# Patient Record
Sex: Female | Born: 1975 | Race: White | Hispanic: No | Marital: Married | Smoking: Current every day smoker
Health system: Southern US, Community
[De-identification: ages and names within clinical notes are randomized; demographics above are authoritative.]

## PROBLEM LIST (undated history)

## (undated) DIAGNOSIS — F319 Bipolar disorder, unspecified: Secondary | ICD-10-CM

## (undated) DIAGNOSIS — D649 Anemia, unspecified: Secondary | ICD-10-CM

## (undated) DIAGNOSIS — T7840XA Allergy, unspecified, initial encounter: Secondary | ICD-10-CM

## (undated) DIAGNOSIS — R0902 Hypoxemia: Secondary | ICD-10-CM

## (undated) DIAGNOSIS — G4730 Sleep apnea, unspecified: Secondary | ICD-10-CM

## (undated) DIAGNOSIS — K219 Gastro-esophageal reflux disease without esophagitis: Secondary | ICD-10-CM

## (undated) DIAGNOSIS — R011 Cardiac murmur, unspecified: Secondary | ICD-10-CM

## (undated) DIAGNOSIS — J45909 Unspecified asthma, uncomplicated: Secondary | ICD-10-CM

## (undated) DIAGNOSIS — J449 Chronic obstructive pulmonary disease, unspecified: Secondary | ICD-10-CM

## (undated) DIAGNOSIS — S069X9A Unspecified intracranial injury with loss of consciousness of unspecified duration, initial encounter: Secondary | ICD-10-CM

## (undated) DIAGNOSIS — R0600 Dyspnea, unspecified: Secondary | ICD-10-CM

## (undated) DIAGNOSIS — R413 Other amnesia: Secondary | ICD-10-CM

## (undated) DIAGNOSIS — N189 Chronic kidney disease, unspecified: Secondary | ICD-10-CM

## (undated) DIAGNOSIS — N809 Endometriosis, unspecified: Secondary | ICD-10-CM

## (undated) DIAGNOSIS — G43909 Migraine, unspecified, not intractable, without status migrainosus: Secondary | ICD-10-CM

## (undated) DIAGNOSIS — F329 Major depressive disorder, single episode, unspecified: Secondary | ICD-10-CM

## (undated) DIAGNOSIS — F32A Depression, unspecified: Secondary | ICD-10-CM

## (undated) DIAGNOSIS — Z9289 Personal history of other medical treatment: Secondary | ICD-10-CM

## (undated) DIAGNOSIS — F419 Anxiety disorder, unspecified: Secondary | ICD-10-CM

## (undated) DIAGNOSIS — J189 Pneumonia, unspecified organism: Secondary | ICD-10-CM

## (undated) DIAGNOSIS — M1990 Unspecified osteoarthritis, unspecified site: Secondary | ICD-10-CM

## (undated) HISTORY — DX: Anxiety disorder, unspecified: F41.9

## (undated) HISTORY — DX: Unspecified intracranial injury with loss of consciousness of unspecified duration, initial encounter: S06.9X9A

## (undated) HISTORY — DX: Chronic obstructive pulmonary disease, unspecified: J44.9

## (undated) HISTORY — PX: HERNIA REPAIR: SHX51

## (undated) HISTORY — DX: Cardiac murmur, unspecified: R01.1

## (undated) HISTORY — DX: Depression, unspecified: F32.A

## (undated) HISTORY — DX: Hypoxemia: R09.02

## (undated) HISTORY — PX: HAND SURGERY: SHX662

## (undated) HISTORY — PX: TUBAL LIGATION: SHX77

## (undated) HISTORY — DX: Unspecified asthma, uncomplicated: J45.909

## (undated) HISTORY — DX: Major depressive disorder, single episode, unspecified: F32.9

## (undated) HISTORY — DX: Migraine, unspecified, not intractable, without status migrainosus: G43.909

## (undated) HISTORY — DX: Chronic kidney disease, unspecified: N18.9

## (undated) HISTORY — DX: Allergy, unspecified, initial encounter: T78.40XA

---

## 1997-04-08 ENCOUNTER — Ambulatory Visit (HOSPITAL_COMMUNITY): Admission: RE | Admit: 1997-04-08 | Discharge: 1997-04-08 | Payer: Self-pay | Admitting: Obstetrics and Gynecology

## 1997-07-21 ENCOUNTER — Other Ambulatory Visit: Admission: RE | Admit: 1997-07-21 | Discharge: 1997-07-21 | Payer: Self-pay | Admitting: General Surgery

## 1997-09-15 ENCOUNTER — Inpatient Hospital Stay (HOSPITAL_COMMUNITY): Admission: AD | Admit: 1997-09-15 | Discharge: 1997-09-18 | Payer: Self-pay | Admitting: Obstetrics and Gynecology

## 1997-10-20 ENCOUNTER — Other Ambulatory Visit: Admission: RE | Admit: 1997-10-20 | Discharge: 1997-10-20 | Payer: Self-pay | Admitting: Obstetrics and Gynecology

## 1998-10-23 ENCOUNTER — Other Ambulatory Visit: Admission: RE | Admit: 1998-10-23 | Discharge: 1998-10-23 | Payer: Self-pay | Admitting: Obstetrics and Gynecology

## 1999-02-12 DIAGNOSIS — S069XAA Unspecified intracranial injury with loss of consciousness status unknown, initial encounter: Secondary | ICD-10-CM

## 1999-02-12 DIAGNOSIS — S069X9A Unspecified intracranial injury with loss of consciousness of unspecified duration, initial encounter: Secondary | ICD-10-CM

## 1999-02-12 HISTORY — DX: Unspecified intracranial injury with loss of consciousness status unknown, initial encounter: S06.9XAA

## 1999-02-12 HISTORY — DX: Unspecified intracranial injury with loss of consciousness of unspecified duration, initial encounter: S06.9X9A

## 1999-04-16 ENCOUNTER — Emergency Department (HOSPITAL_COMMUNITY): Admission: EM | Admit: 1999-04-16 | Discharge: 1999-04-16 | Payer: Self-pay | Admitting: Emergency Medicine

## 1999-04-17 ENCOUNTER — Encounter: Payer: Self-pay | Admitting: Emergency Medicine

## 1999-08-06 ENCOUNTER — Inpatient Hospital Stay (HOSPITAL_COMMUNITY)
Admission: RE | Admit: 1999-08-06 | Discharge: 1999-08-31 | Payer: Self-pay | Admitting: Physical Medicine & Rehabilitation

## 1999-08-15 ENCOUNTER — Encounter: Payer: Self-pay | Admitting: Physical Medicine & Rehabilitation

## 1999-09-05 ENCOUNTER — Encounter
Admission: RE | Admit: 1999-09-05 | Discharge: 1999-12-04 | Payer: Self-pay | Admitting: Physical Medicine & Rehabilitation

## 2000-02-28 ENCOUNTER — Encounter
Admission: RE | Admit: 2000-02-28 | Discharge: 2000-05-28 | Payer: Self-pay | Admitting: Physical Medicine & Rehabilitation

## 2000-07-08 ENCOUNTER — Encounter
Admission: RE | Admit: 2000-07-08 | Discharge: 2000-10-06 | Payer: Self-pay | Admitting: Physical Medicine & Rehabilitation

## 2000-07-21 ENCOUNTER — Ambulatory Visit (HOSPITAL_COMMUNITY): Admission: RE | Admit: 2000-07-21 | Discharge: 2000-07-21 | Payer: Self-pay | Admitting: Internal Medicine

## 2000-11-22 ENCOUNTER — Emergency Department (HOSPITAL_COMMUNITY): Admission: EM | Admit: 2000-11-22 | Discharge: 2000-11-23 | Payer: Self-pay | Admitting: *Deleted

## 2002-02-17 ENCOUNTER — Ambulatory Visit (HOSPITAL_COMMUNITY): Admission: RE | Admit: 2002-02-17 | Discharge: 2002-02-17 | Payer: Self-pay | Admitting: Family Medicine

## 2002-02-17 ENCOUNTER — Encounter: Payer: Self-pay | Admitting: Family Medicine

## 2004-05-02 ENCOUNTER — Ambulatory Visit (HOSPITAL_COMMUNITY): Admission: RE | Admit: 2004-05-02 | Discharge: 2004-05-02 | Payer: Self-pay | Admitting: Internal Medicine

## 2005-01-14 ENCOUNTER — Ambulatory Visit (HOSPITAL_COMMUNITY): Admission: RE | Admit: 2005-01-14 | Discharge: 2005-01-14 | Payer: Self-pay | Admitting: Neurology

## 2005-03-20 ENCOUNTER — Emergency Department (HOSPITAL_COMMUNITY): Admission: EM | Admit: 2005-03-20 | Discharge: 2005-03-20 | Payer: Self-pay | Admitting: Emergency Medicine

## 2006-07-18 ENCOUNTER — Emergency Department (HOSPITAL_COMMUNITY): Admission: EM | Admit: 2006-07-18 | Discharge: 2006-07-18 | Payer: Self-pay | Admitting: Emergency Medicine

## 2007-10-17 ENCOUNTER — Emergency Department (HOSPITAL_COMMUNITY): Admission: EM | Admit: 2007-10-17 | Discharge: 2007-10-17 | Payer: Self-pay | Admitting: Emergency Medicine

## 2007-10-18 ENCOUNTER — Ambulatory Visit (HOSPITAL_COMMUNITY): Admission: RE | Admit: 2007-10-18 | Discharge: 2007-10-18 | Payer: Self-pay | Admitting: Emergency Medicine

## 2010-03-26 ENCOUNTER — Other Ambulatory Visit: Payer: Self-pay | Admitting: Obstetrics and Gynecology

## 2010-03-26 DIAGNOSIS — R102 Pelvic and perineal pain: Secondary | ICD-10-CM

## 2010-03-30 ENCOUNTER — Ambulatory Visit (HOSPITAL_COMMUNITY)
Admission: RE | Admit: 2010-03-30 | Discharge: 2010-03-30 | Disposition: A | Discharge status: Home / Self Care | Payer: Medicare Other | Source: Ambulatory Visit | Attending: Obstetrics and Gynecology | Admitting: Obstetrics and Gynecology

## 2010-03-30 DIAGNOSIS — N949 Unspecified condition associated with female genital organs and menstrual cycle: Secondary | ICD-10-CM | POA: Insufficient documentation

## 2010-03-30 DIAGNOSIS — R102 Pelvic and perineal pain: Secondary | ICD-10-CM

## 2010-05-11 ENCOUNTER — Other Ambulatory Visit: Payer: Self-pay | Admitting: Family Medicine

## 2010-05-14 ENCOUNTER — Ambulatory Visit (HOSPITAL_COMMUNITY)
Admission: RE | Admit: 2010-05-14 | Discharge: 2010-05-14 | Disposition: A | Discharge status: Home / Self Care | Payer: Medicare Other | Source: Ambulatory Visit | Attending: Family Medicine | Admitting: Family Medicine

## 2010-05-14 DIAGNOSIS — R109 Unspecified abdominal pain: Secondary | ICD-10-CM | POA: Insufficient documentation

## 2010-11-14 LAB — URINALYSIS, ROUTINE W REFLEX MICROSCOPIC
Bilirubin Urine: NEGATIVE
Ketones, ur: NEGATIVE
Protein, ur: NEGATIVE
pH: 6.5

## 2010-11-14 LAB — PREGNANCY, URINE: Preg Test, Ur: NEGATIVE

## 2010-11-14 LAB — DIFFERENTIAL
Basophils Absolute: 0.1
Basophils Relative: 1
Eosinophils Relative: 5
Lymphs Abs: 2.2
Monocytes Relative: 6
Neutro Abs: 5.8
Neutrophils Relative %: 64

## 2010-11-14 LAB — BASIC METABOLIC PANEL
Calcium: 9.2
GFR calc Af Amer: >60
GFR calc non Af Amer: >60
Potassium: 3.6
Sodium: 139

## 2010-11-14 LAB — CBC
HCT: 39.3
Hemoglobin: 13.1
MCHC: 33.5
MCV: 80.8
Platelets: 326
RDW: 15.2

## 2010-11-14 LAB — WET PREP, GENITAL: Trich, Wet Prep: NONE SEEN

## 2011-04-02 DIAGNOSIS — R413 Other amnesia: Secondary | ICD-10-CM | POA: Diagnosis not present

## 2011-04-02 DIAGNOSIS — I10 Essential (primary) hypertension: Secondary | ICD-10-CM | POA: Diagnosis not present

## 2011-04-02 DIAGNOSIS — R51 Headache: Secondary | ICD-10-CM | POA: Diagnosis not present

## 2011-04-23 DIAGNOSIS — E785 Hyperlipidemia, unspecified: Secondary | ICD-10-CM | POA: Diagnosis not present

## 2011-04-23 DIAGNOSIS — N946 Dysmenorrhea, unspecified: Secondary | ICD-10-CM | POA: Diagnosis not present

## 2011-04-26 DIAGNOSIS — N949 Unspecified condition associated with female genital organs and menstrual cycle: Secondary | ICD-10-CM | POA: Diagnosis not present

## 2011-05-20 DIAGNOSIS — D259 Leiomyoma of uterus, unspecified: Secondary | ICD-10-CM | POA: Diagnosis not present

## 2011-05-20 DIAGNOSIS — N949 Unspecified condition associated with female genital organs and menstrual cycle: Secondary | ICD-10-CM | POA: Diagnosis not present

## 2011-05-27 DIAGNOSIS — N920 Excessive and frequent menstruation with regular cycle: Secondary | ICD-10-CM | POA: Diagnosis not present

## 2011-05-27 DIAGNOSIS — N949 Unspecified condition associated with female genital organs and menstrual cycle: Secondary | ICD-10-CM | POA: Diagnosis not present

## 2011-05-29 DIAGNOSIS — R413 Other amnesia: Secondary | ICD-10-CM | POA: Diagnosis not present

## 2011-07-26 ENCOUNTER — Ambulatory Visit (INDEPENDENT_AMBULATORY_CARE_PROVIDER_SITE_OTHER): Payer: Medicare Other | Admitting: Urgent Care

## 2011-07-26 ENCOUNTER — Encounter: Payer: Self-pay | Admitting: Urgent Care

## 2011-07-26 VITALS — BP 122/74 | HR 75 | Temp 97.4°F | Ht 69.0 in | Wt 194.0 lb

## 2011-07-26 DIAGNOSIS — R350 Frequency of micturition: Secondary | ICD-10-CM | POA: Insufficient documentation

## 2011-07-26 DIAGNOSIS — R109 Unspecified abdominal pain: Secondary | ICD-10-CM | POA: Diagnosis not present

## 2011-07-26 DIAGNOSIS — K5900 Constipation, unspecified: Secondary | ICD-10-CM | POA: Diagnosis not present

## 2011-07-26 LAB — CBC WITH DIFFERENTIAL/PLATELET
Basophils Absolute: 0.1 10*3/uL (ref 0.0–0.1)
Basophils Relative: 1 % (ref 0–1)
Eosinophils Relative: 6 % — ABNORMAL HIGH (ref 0–5)
HCT: 43.6 % (ref 36.0–46.0)
Hemoglobin: 15.1 g/dL — ABNORMAL HIGH (ref 12.0–15.0)
Lymphs Abs: 1.9 10*3/uL (ref 0.7–4.0)
MCH: 28.8 pg (ref 26.0–34.0)
Neutrophils Relative %: 63 % (ref 43–77)
Platelets: 365 10*3/uL (ref 150–400)
RBC: 5.24 MIL/uL — ABNORMAL HIGH (ref 3.87–5.11)
RDW: 13.5 % (ref 11.5–15.5)

## 2011-07-26 LAB — COMPREHENSIVE METABOLIC PANEL
AST: 14 U/L (ref 0–37)
Alkaline Phosphatase: 70 U/L (ref 39–117)
CO2: 27 mEq/L (ref 19–32)
Calcium: 9.5 mg/dL (ref 8.4–10.5)
Creat: 0.81 mg/dL (ref 0.50–1.10)
Potassium: 4.5 mEq/L (ref 3.5–5.3)
Sodium: 141 mEq/L (ref 135–145)
Total Bilirubin: 0.4 mg/dL (ref 0.3–1.2)
Total Protein: 7 g/dL (ref 6.0–8.3)

## 2011-07-26 MED ORDER — LINACLOTIDE 145 MCG PO CAPS: 1.0000 | ORAL_CAPSULE | Freq: Every day | ORAL | Status: DC

## 2011-07-26 NOTE — Assessment & Plan Note (Signed)
R/o UTI
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
## 2011-07-26 NOTE — Assessment & Plan Note (Addendum)
Shelby [name] is a pleasant 36 y.o. female with 10-yr history of chronic abdominal pain, mostly bilateral lower quadrants.  She also has increased urinary frequency, nausea & constipation at this time.  Previous GYN work-up benign per pt.  Differentials include IBS w/ constipation, functional abdominal pain, celiac disease, diverticulitis, or UTI.     CBC, CMP, lipase, UA, U preg IFOBT, if positive consider colonoscopy 1-800-QUIT-NOW for help quitting smoking To ER if you have severe pain

## 2011-07-26 NOTE — Patient Instructions (Addendum)
1-800-QUIT-NOW for help quitting smoking High fiber diet Return stool specimen to our office ASAP Begin Linzess daily for constipation Go get your labs today-we will call w/ results To ER if you have severe pain High Fiber Diet A high fiber diet changes your normal diet to include more whole grains, legumes, fruits, and vegetables. Changes in the diet involve replacing refined carbohydrates with unrefined foods. The calorie level of the diet is essentially unchanged. The Dietary Reference Intake (recommended amount) for adult males is 38 g per day. For adult females, it is 25 g per day. Pregnant and lactating women should consume 28 g of fiber per day. Fiber is the intact part of a plant that is not broken down during digestion. Functional fiber is fiber that has been isolated from the plant to provide a beneficial effect in the body. PURPOSE  Increase stool bulk.   Ease and regulate bowel movements.   Lower cholesterol.  INDICATIONS THAT YOU NEED MORE FIBER  Constipation and hemorrhoids.   Uncomplicated diverticulosis (intestine condition) and irritable bowel syndrome.   Weight management.   As a protective measure against hardening of the arteries (atherosclerosis), diabetes, and cancer.  NOTE OF CAUTION If you have a digestive or bowel problem, ask your caregiver for advice before adding high fiber foods to your diet. Some of the following medical problems are such that a high fiber diet should not be used without consulting your caregiver:  Acute diverticulitis (intestine infection).   Partial small bowel obstructions.   Complicated diverticular disease involving bleeding, rupture (perforation), or abscess (boil, furuncle).   Presence of autonomic neuropathy (nerve damage) or gastric paresis (stomach cannot empty itself).  GUIDELINES FOR INCREASING FIBER  Start adding fiber to the diet slowly. A gradual increase of about 5 more grams (2 slices of whole-wheat bread, 2 servings  of most fruits or vegetables, or 1 bowl of high fiber cereal) per day is best. Too rapid an increase in fiber may result in constipation, flatulence, and bloating.   Drink enough water and fluids to keep your urine clear or pale yellow. Water, juice, or caffeine-free drinks are recommended. Not drinking enough fluid may cause constipation.   Eat a variety of high fiber foods rather than one type of fiber.   Try to increase your intake of fiber through using high fiber foods rather than fiber pills or supplements that contain small amounts of fiber.   The goal is to change the types of food eaten. Do not supplement your present diet with high fiber foods, but replace foods in your present diet.  INCLUDE A VARIETY OF FIBER SOURCES  Replace refined and processed grains with whole grains, canned fruits with fresh fruits, and incorporate other fiber sources. White rice, white breads, and most bakery goods contain little or no fiber.   Brown whole-grain rice, buckwheat oats, and many fruits and vegetables are all good sources of fiber. These include: broccoli, Brussels sprouts, cabbage, cauliflower, beets, sweet potatoes, white potatoes (skin on), carrots, tomatoes, eggplant, squash, berries, fresh fruits, and dried fruits.   Cereals appear to be the richest source of fiber. Cereal fiber is found in whole grains and bran. Bran is the fiber-rich outer coat of cereal grain, which is largely removed in refining. In whole-grain cereals, the bran remains. In breakfast cereals, the largest amount of fiber is found in those with "bran" in their names. The fiber content is sometimes indicated on the label.   You may need to include additional fruits  and vegetables each day.   In baking, for 1 cup white flour, you may use the following substitutions:   1 cup whole-wheat flour minus 2 tbs.    cup white flour plus  cup whole-wheat flour.  Document Released: 01/28/2005 Document Revised: 01/17/2011 Document  Reviewed: 12/06/2008 [facility] Patient Information 2012 [place], [place].

## 2011-07-26 NOTE — Assessment & Plan Note (Signed)
Chronic constipation.  Failed miralax. High fiber diet Begin Linzess daily for constipation TSH, TTG IgA, IgA

## 2011-07-26 NOTE — Progress Notes (Signed)
FAXED TO PCP 

## 2011-07-27 LAB — URINALYSIS W MICROSCOPIC + REFLEX CULTURE
Crystals: NONE SEEN
Ketones, ur: NEGATIVE mg/dL
Protein, ur: NEGATIVE mg/dL
Specific Gravity, Urine: 1.016 (ref 1.005–1.030)
pH: 6 (ref 5.0–8.0)

## 2011-07-29 ENCOUNTER — Ambulatory Visit (INDEPENDENT_AMBULATORY_CARE_PROVIDER_SITE_OTHER): Payer: Medicare Other | Admitting: Urgent Care

## 2011-07-29 DIAGNOSIS — R109 Unspecified abdominal pain: Secondary | ICD-10-CM | POA: Diagnosis not present

## 2011-07-29 LAB — IFOBT (OCCULT BLOOD): IFOBT: NEGATIVE

## 2011-07-29 NOTE — Progress Notes (Signed)
Quick Note:  Pt needs abx for UTI. Do we have sensitivity results somewhere? Thanks ______

## 2011-07-30 ENCOUNTER — Telehealth: Payer: Self-pay | Admitting: Urgent Care

## 2011-07-30 LAB — URINE CULTURE

## 2011-07-30 MED ORDER — VANCOMYCIN HCL 125 MG PO CAPS: 125.0000 mg | ORAL_CAPSULE | Freq: Four times a day (QID) | ORAL | Status: AC

## 2011-07-30 NOTE — Progress Notes (Signed)
Quick Note:  See phone note ______ 

## 2011-07-30 NOTE — Progress Notes (Signed)
Quick Note:    Noted. See phone note  ______

## 2011-07-30 NOTE — Telephone Encounter (Signed)
Pt is aware of her OV with RMR on July 23

## 2011-07-30 NOTE — Telephone Encounter (Signed)
Pt aware, please schedule ov with RMR only (per pt request) in 6 weeks.

## 2011-07-30 NOTE — Telephone Encounter (Signed)
Please call pt Urine cx positive for urinary tract infection. Pt allergic to PCN, will need Vanc per sensitivity.   Rx sent to pharm. Otherwise, blood work was normal. No evidence of celiac disease. FU OV  6 weeks to reassess Thanks

## 2011-08-06 ENCOUNTER — Telehealth: Payer: Self-pay | Admitting: Urgent Care

## 2011-08-06 MED ORDER — LUBIPROSTONE 24 MCG PO CAPS: 24.0000 ug | ORAL_CAPSULE | Freq: Two times a day (BID) | ORAL | Status: AC

## 2011-08-06 NOTE — Telephone Encounter (Signed)
Pts mother aware, samples at front desk with instructions on how to take.

## 2011-08-06 NOTE — Telephone Encounter (Signed)
Please let patient know her insurance would like a trial of Amitiza prior to coverage for Linzess. If she wants to do this, we can give her 2 boxes of samples of Amitiza 24 mcg daily or BID for constipation.  Be sure to take with food. Rx was sent to [facility] in [place]

## 2011-09-03 ENCOUNTER — Ambulatory Visit (INDEPENDENT_AMBULATORY_CARE_PROVIDER_SITE_OTHER): Payer: Medicare Other | Admitting: Internal Medicine

## 2011-09-03 ENCOUNTER — Encounter: Payer: Self-pay | Admitting: Internal Medicine

## 2011-09-03 VITALS — BP 119/76 | HR 79 | Temp 98.1°F | Ht 69.0 in | Wt 198.0 lb

## 2011-09-03 DIAGNOSIS — R109 Unspecified abdominal pain: Secondary | ICD-10-CM | POA: Diagnosis not present

## 2011-09-03 NOTE — Progress Notes (Signed)
Primary Care Physician:  Redmond Baseman, MD Primary Gastroenterologist:  Dr. Jena Gauss  Pre-Procedure History & Physical: HPI:  Shelby [name] is a 36 y.o. female here for followup of chronic infraumbilical abdominal/pelvic pain. Celiac screen negative. Urine pregnancy negative. Stool Hemoccult negative. Celiac screen negative. Constipation much better on the Linzess - no improvement in abdominal pain. Enterococcus UTI treated with vancomycin-no improvement in abdominal pain.  Pain worse with movement. Pain worse riding over bumps in the car, climbing steps. No improvement with endometrial ablation. Last CT 2012-negative-noncontrast.  Past Medical History  Diagnosis Date  . Asthma   . Depression   . Anxiety   . Traumatic brain injury ?    Dr Gerilyn Pilgrim  . Migraines     Past Surgical History  Procedure Date  . Cesarean section     x2  . Cyst remove     x3 from wrist-ganglion  . Uterine ablation     Prior to Admission medications   Medication Sig Start Date End Date Taking? Authorizing Provider  ibuprofen (ADVIL,MOTRIN) 200 MG tablet Take 800 mg by mouth every 8 (eight) hours as needed. For migraines   Yes Historical Provider, MD  Linaclotide (LINZESS) 145 MCG CAPS Take 1 capsule by mouth daily. 07/26/11  Yes Joselyn Arrow, NP  lubiprostone (AMITIZA) 24 MCG capsule Take 1 capsule (24 mcg total) by mouth 2 (two) times daily with a meal. 08/06/11 09/05/11 Yes Joselyn Arrow, NP    Allergies as of 09/03/2011 - Review Complete 09/03/2011  Allergen Reaction Noted  . Penicillins Other (See Comments) 07/26/2011    Family History  Problem Relation Age of Onset  . Diabetes Father   . Colon cancer Paternal Grandfather     History   Social History  . Marital Status: Divorced    Spouse Name: N/A    Number of Children: 2  . Years of Education: N/A   Occupational History  . disabled    Social History Main Topics  . Smoking status: Current Everyday Smoker -- 1.5 packs/day for  10 years    Types: Cigarettes  . Smokeless tobacco: Not on file  . Alcohol Use: No  . Drug Use: No  . Sexually Active: Not Currently   Other Topics Concern  . Not on file   Social History Narrative   Lives w/ parents & 2 children part-time (13/16)    Review of Systems: See HPI, otherwise negative ROS  Physical Exam: BP 119/76  Pulse 79  Temp 98.1 F (36.7 C) (Temporal)  Ht 5\' 9"  (1.753 m)  Wt 198 lb (89.812 kg)  BMI 29.24 kg/m2 General:   Alert,  Well-developed, well-nourished, pleasant and cooperative in NAD Skin:  Intact without significant lesions or rashes. Eyes:  Sclera clear, no icterus.   Conjunctiva pink. Ears:  Normal auditory acuity. Nose:  No deformity, discharge,  or lesions. Mouth:  No deformity or lesions. Neck:  Supple; no masses or thyromegaly. No significant cervical adenopathy. Lungs:  Clear throughout to auscultation.   No wheezes, crackles, or rhonchi. No acute distress. Heart:  Regular rate and rhythm; no murmurs, clicks, rubs,  or gallops. Abdomen: Non-distended, normal bowel sounds. No tenderness both lower quadrants. No obvious mass or organomegaly. Pulses:  Normal pulses noted. Extremities:  Without clubbing or edema.  Impression/Plan:  Chronic bilateral lower quadrant abdominal pain/pelvic pain not improved with the treatment of UTI, endometrial ablation or successful management of constipation. We're likely dealing with musculoskeletal pain.   An occult hernia or  persisting occult GYN pathology-less likely.   Adhesive disease certainly a possibility.  Recommendations: Contrast CT of abdomen and pelvis in the near future. If negative, consider diagnostic laparoscopy. If that procedure were not to be helpful, then she will need referral to pain management specialist.

## 2011-09-03 NOTE — Patient Instructions (Signed)
Contrast CT of abdomen and pelvis - to evaluate chronic pelvic pain  Further recommendations to follow

## 2011-09-04 ENCOUNTER — Ambulatory Visit (HOSPITAL_COMMUNITY)
Admission: RE | Admit: 2011-09-04 | Discharge: 2011-09-04 | Disposition: A | Discharge status: Home / Self Care | Payer: Medicare Other | Source: Ambulatory Visit | Attending: Internal Medicine | Admitting: Internal Medicine

## 2011-09-04 DIAGNOSIS — R109 Unspecified abdominal pain: Secondary | ICD-10-CM | POA: Diagnosis not present

## 2011-09-04 DIAGNOSIS — R1032 Left lower quadrant pain: Secondary | ICD-10-CM | POA: Diagnosis not present

## 2011-09-04 DIAGNOSIS — R1031 Right lower quadrant pain: Secondary | ICD-10-CM | POA: Diagnosis not present

## 2011-09-04 MED ORDER — IOHEXOL 300 MG/ML  SOLN
100.0000 mL | Freq: Once | INTRAMUSCULAR | Status: AC | PRN
  Administered 2011-09-04: 100 mL via INTRAVENOUS

## 2011-09-09 NOTE — Progress Notes (Signed)
Referral has been sent to Dr. Lovell Sheehan and Dr. Cheryll Cockayne

## 2011-09-10 ENCOUNTER — Telehealth: Payer: Self-pay | Admitting: Internal Medicine

## 2011-09-10 NOTE — Telephone Encounter (Signed)
Patient is scheduled with Dr. Lovell Sheehan office on Tuesday Aug 6th at 2:45 and patient is aware

## 2011-09-14 DIAGNOSIS — H109 Unspecified conjunctivitis: Secondary | ICD-10-CM | POA: Diagnosis not present

## 2011-09-14 DIAGNOSIS — F17200 Nicotine dependence, unspecified, uncomplicated: Secondary | ICD-10-CM | POA: Diagnosis not present

## 2011-09-14 DIAGNOSIS — H1030 Unspecified acute conjunctivitis, unspecified eye: Secondary | ICD-10-CM | POA: Diagnosis not present

## 2011-09-17 DIAGNOSIS — K429 Umbilical hernia without obstruction or gangrene: Secondary | ICD-10-CM | POA: Diagnosis not present

## 2011-09-17 NOTE — H&P (Signed)
  NTS SOAP Note  Vital Signs:  Vitals as of: 09/17/2011: Systolic 139: Diastolic 91: Heart Rate 90: Temp 98.9F: Height 5ft 9in: Weight 198Lbs 0 Ounces: Pain Level 5: BMI 29  BMI : 29.24 kg/m2  Subjective: This 36 Years 7 Months old Female presents for of    ABDOMINAL PAIN : ,Has been having lower abdominal pain, extending from below umbilicus to the pelvis bilaterally.  Not associated with food, specific movement, menstrual cycle.  No nausea, vomiting.  No masses noted.  Extensive workup including multiple gyn visits and GI consultation has been unrermarkable.  Review of Symptoms:  Constitutional:unremarkable      headache Eyes:  blurred vision bilateral Nose/Mouth/Throat:unremarkable Cardiovascular:  unremarkable   Respiratory:  dyspnea,wheezing     as above Genitourinary:unremarkable     Musculoskeletal:unremarkable   Skin:unremarkable Hematolgic/Lymphatic:unremarkable     Allergic/Immunologic:unremarkable     Past Medical History:    Reviewed   Past Medical History  Surgical History: 2 csections Psychiatric History:  Anxiety, Depression Allergies: pcn Medications: polymysin eye drops, namenda, amitiza, dorepezil   Social History:Reviewed  Social History  Preferred Language: English ([place]) Race:  White Ethnicity: Not Hispanic / Latino Age: 36 Years 7 Months Marital Status:  S Alcohol:  No Recreational drug(s):  No   Smoking Status: Current every day smoker reviewed on 09/17/2011 Started Date: 02/12/1995 Packs per day: 2.00   Family History:  Reviewed   Family History  Is there a family history of:cancer, DM, alzheimers, CAD    Objective Information: General:  Well appearing, well nourished in no distress. Head:Atraumatic; no masses; no abnormalities Heart:  RRR, no murmur Lungs:    CTA bilaterally, no wheezes, rhonchi, rales.  Breathing unlabored. Abdomen:Soft, NT/ND, normal  bowel sounds, no HSM, no masses.  No peritoneal signs.  Small umbilical hernia with protruding contents with straining. Back:unremarkable    Assessment:Umbilical hernia  Diagnosis &amp; Procedure: DiagnosisCode: 553.1, ProcedureCode: 99203,    Plan:Scheduled for umbilical herniorrhaphy on 09/20/11.  Patient and mother (who is a nurse) fully realize that this may or may not relieve the patient of pain.  I do not feel that laparoscopy and/or incidental appendectomy are indicated at this time.  They understand and agree.   Patient Education:Alternative treatments to surgery were discussed with patient (and family).  Risks and benefits  of procedure were fully explained to the patient (and family) who gave informed consent. Patient/family questions were addressed.  Follow-up:Pending Surgery  

## 2011-09-18 ENCOUNTER — Encounter (HOSPITAL_COMMUNITY): Payer: Self-pay | Admitting: Pharmacy Technician

## 2011-09-19 ENCOUNTER — Encounter (HOSPITAL_COMMUNITY): Payer: Self-pay

## 2011-09-19 ENCOUNTER — Encounter (HOSPITAL_COMMUNITY): Admission: RE | Admit: 2011-09-19 | Payer: Medicare Other | Source: Ambulatory Visit

## 2011-09-19 ENCOUNTER — Encounter (HOSPITAL_COMMUNITY): Payer: Self-pay | Admitting: Pharmacy Technician

## 2011-09-19 ENCOUNTER — Encounter (HOSPITAL_COMMUNITY)
Admission: RE | Admit: 2011-09-19 | Discharge: 2011-09-19 | Disposition: A | Discharge status: Home / Self Care | Payer: Medicare Other | Source: Ambulatory Visit | Attending: General Surgery | Admitting: General Surgery

## 2011-09-19 DIAGNOSIS — Z01812 Encounter for preprocedural laboratory examination: Secondary | ICD-10-CM | POA: Diagnosis not present

## 2011-09-19 DIAGNOSIS — K429 Umbilical hernia without obstruction or gangrene: Secondary | ICD-10-CM | POA: Diagnosis not present

## 2011-09-19 LAB — SURGICAL PCR SCREEN: Staphylococcus aureus: NEGATIVE

## 2011-09-19 LAB — HCG, SERUM, QUALITATIVE: Preg, Serum: NEGATIVE

## 2011-09-19 LAB — HEMOGLOBIN AND HEMATOCRIT, BLOOD: HCT: 42.8 % (ref 36.0–46.0)

## 2011-09-19 MED ORDER — CHLORHEXIDINE GLUCONATE 4 % EX LIQD: 1.0000 "application " | Freq: Once | CUTANEOUS | Status: DC

## 2011-09-19 NOTE — Patient Instructions (Addendum)
20 Shelby [name]  09/19/2011   Your procedure is scheduled on:  09/20/11  Report to [facility] at 09:50 AM.  Call this number if you have problems the morning of surgery: [phone]   Remember:   Do not eat or drink:After Midnight.  Take these medicines the morning of surgery with A SIP OF WATER: Namenda, Aricept, and use your Albuterol inhaler.   Do not wear jewelry, make-up or nail polish.  Do not wear lotions, powders, or perfumes. You may wear deodorant.  Do not shave 48 hours prior to surgery. Men may shave face and neck.  Do not bring valuables to the hospital.  Contacts, dentures or bridgework may not be worn into surgery.  Leave suitcase in the car. After surgery it may be brought to your room.  For patients admitted to the hospital, checkout time is 11:00 AM the day of discharge.   Patients discharged the day of surgery will not be allowed to drive home  Special Instructions: CHG Shower Use Special Wash: 1/2 bottle night before surgery and 1/2 bottle morning of surgery.   Please read over the following fact sheets that you were given: Pain Booklet, MRSA Information, Surgical Site Infection Prevention, Anesthesia Post-op Instructions and Care and Recovery After Surgery    Umbilical Herniorrhaphy A herniorrhaphy is surgery to repair a hernia. A hernia is a gap or weakness in the muscles of your abdomen. Umbilical means that your hernia is in the area around your belly button. You might be able to feel a small bulge in your abdomen where the hernia is. You might also have pain or discomfort. If the hernia is not repaired, the gap could get bigger. Your intestines could get trapped in the gap. This can be painful. It also can lead to other health problems, such as blocked intestines. LET YOUR CAREGIVER KNOW ABOUT:  Any allergies.   All medications you are taking, including:   Herbs, eyedrops, over-the-counter medications and cream   Blood thinners (anticoagulants), aspirin or  other drugs that could affect blood clotting.   Use of steroids (by mouth or as creams).   Previous problems with anesthetics, including local anesthetics.   Possibility of pregnancy, if this applies.   Any history of blood clots.   Any history of bleeding or other blood problems.   Previous surgery.   Smoking history.   Other health problems.  RISKS AND COMPLICATIONS  Short-term possibilities include:   Pain.   Excessive bleeding.   Hematoma. This is a pooling of blood under the wound.   Infection at the surgery site or of the mesh.   Numbness at the surgery site.   Swelling and bruising.   Slow healing.   Blood clots.   Intestine or bowel damage. This is rare.   Longer-term possibilities include:   Scarring.   Skin damage.   The need for additional surgery.   Another hernia.  BEFORE THE PROCEDURE  Stop using aspirin and non-steroidal anti-inflammatory drugs (NSAIDs) for pain relief. Also stop taking vitamin E. If possible, do this two weeks in advance.   If you take blood-thinners, ask your healthcare provider when you should stop taking them.   Do not eat or drink for about 8 hours before your surgery.   You might be asked to shower or wash with a special antibacterial soap before the procedure.   Wear clothes that will be easy to put on after the surgery.   Arrive at least an hour before  the surgery, or whenever your surgeon recommends. This will give you time to check in and fill out any needed paperwork.   This surgery is usually an outpatient procedure, so you will be able to go home the same day. Less often people stay overnight in the hospital after the procedure. Ask your healthcare provider what to expect. Either way, make arrangements in advance for someone to drive you home. After an outpatient procedure, you should have someone stay with you overnight.  PROCEDURE Your procedure can be done with traditional surgery. The surgeon opens the  abdomen and repairs the hernia. Or, sometimes it can be done with laparoscopic surgery. Then the procedure is done through multiple small incisions, using a camera to guide the repair. Talk with your surgeon about how the hernia will be repaired.  The preparation:   You will change into a hospital gown.   You will be given an IV. A needle will be inserted in your arm. Medication can flow directly into your body through this needle.   You might be given a sedative to help you relax.   You may be given a drug that will put you to sleep during the surgery (general anesthetic). Or, your abdomen will be numbed, and you will be drowsy but awake (local anesthetic).   For a traditional surgery (sometimes called open surgery):   Once you are pain-free, the surgeon will make a small cut (incision) in your abdomen.   The gap in the muscle wall will be repaired. The surgeon could sew muscle together over the gap. Or, a mesh or soft screen material can be used to strengthen the area. The mesh acts as a scaffolding and the body grows new strong tissue into and around it. This new tissue is what closes the gap of the hernia and prevents it from coming back.   A drain might be put in. Fluid sometimes collects under the wound as it heals. The drain helps get the fluid out of the area. A drain will probably be used if your hernia is large.   The surgeon will close the incision with small stitches.   For a laparoscopic surgery:   One you are pain-free, the surgeon will make a small incision in your abdomen.   A thin tube with a tiny camera attached to it will be inserted into the abdomen through the incision. What the camera "sees" is projected on a screen in the room. This gives the surgeon a good view of the hernia.   Other small incisions will be made so the surgeon can insert small tools that are used to repair the hernia.   The incisions will be closed with small stitches.  AFTER THE  PROCEDURE  You will be stay in a recovery area until the anesthesia has worn off. Your blood pressure and pulse will be checked every so often.   You might be asked to get up and try walking.   Sometimes people can go home the same day. For others, an overnight stay is needed.  HOME CARE INSTRUCTIONS   Take any medication that your surgeon prescribed. Follow the directions carefully. Take all of the medication.   Ask your surgeon whether you can take over-the-counter medicines for pain, discomfort or fever. Do not take aspirin unless your healthcare team says that you should. Aspirin increases the chances of bleeding.   Do not get the incision area wet for the first few days after surgery (or until your surgeon  says it is OK).   Avoid lifting heavy objects (more than 10 lbs, 4.5 kilograms) for 6 to 8 weeks after your surgery.   Expect some pain when climbing stairs for several days after surgery.   Avoid sexual activity for a few weeks. It can be uncomfortable or painful.   You should be able to drive within a few days. However, do not drive until you are no longer taking pain medicine. It can make you drowsy. It also can slow your reaction time.   You should be able to resume normal activity in a few days. When you can return to work will depend on the type of work you do. You can go back to a desk job much sooner than you can return to work that requires physical labor. Talk about this with your healthcare provider.  SEEK MEDICAL CARE IF:   You notice blood or fluid leaking from the wound.   The area around the incision becomes red or swollen.   You are having problems urinating.   You become nauseous or throw up for more than two days after the surgery.   You develop a fever of more than 100.5 F (38.1 C).  SEEK IMMEDIATE MEDICAL CARE IF:  You develop a fever of more than 102.0 F (38.9 C). Document Released: 04/26/2008 Document Revised: 01/17/2011 Document Reviewed:  04/26/2008 [facility] Patient Information 2012 [place], [place].   PATIENT INSTRUCTIONS POST-ANESTHESIA  IMMEDIATELY FOLLOWING SURGERY:  Do not drive or operate machinery for the first twenty four hours after surgery.  Do not make any important decisions for twenty four hours after surgery or while taking narcotic pain medications or sedatives.  If you develop intractable nausea and vomiting or a severe headache please notify your doctor immediately.  FOLLOW-UP:  Please make an appointment with your surgeon as instructed. You do not need to follow up with anesthesia unless specifically instructed to do so.  WOUND CARE INSTRUCTIONS (if applicable):  Keep a dry clean dressing on the anesthesia/puncture wound site if there is drainage.  Once the wound has quit draining you may leave it open to air.  Generally you should leave the bandage intact for twenty four hours unless there is drainage.  If the epidural site drains for more than 36-48 hours please call the anesthesia department.  QUESTIONS?:  Please feel free to call your physician or the hospital operator if you have any questions, and they will be happy to assist you.

## 2011-09-20 ENCOUNTER — Encounter (HOSPITAL_COMMUNITY): Admission: RE | Payer: Self-pay | Source: Ambulatory Visit

## 2011-09-20 ENCOUNTER — Ambulatory Visit (HOSPITAL_COMMUNITY): Admission: RE | Admit: 2011-09-20 | Payer: Medicare Other | Source: Ambulatory Visit | Admitting: General Surgery

## 2011-09-20 ENCOUNTER — Ambulatory Visit (HOSPITAL_COMMUNITY): Payer: Medicare Other | Admitting: Anesthesiology

## 2011-09-20 ENCOUNTER — Encounter (HOSPITAL_COMMUNITY)
Admission: RE | Disposition: A | Discharge status: Home / Self Care | Payer: Self-pay | Source: Ambulatory Visit | Attending: General Surgery

## 2011-09-20 ENCOUNTER — Encounter (HOSPITAL_COMMUNITY): Payer: Self-pay | Admitting: *Deleted

## 2011-09-20 ENCOUNTER — Encounter (HOSPITAL_COMMUNITY): Payer: Self-pay | Admitting: Anesthesiology

## 2011-09-20 ENCOUNTER — Ambulatory Visit (HOSPITAL_COMMUNITY)
Admission: RE | Admit: 2011-09-20 | Discharge: 2011-09-20 | Disposition: A | Discharge status: Home / Self Care | Payer: Medicare Other | Source: Ambulatory Visit | Attending: General Surgery | Admitting: General Surgery

## 2011-09-20 DIAGNOSIS — Z01812 Encounter for preprocedural laboratory examination: Secondary | ICD-10-CM | POA: Insufficient documentation

## 2011-09-20 DIAGNOSIS — K429 Umbilical hernia without obstruction or gangrene: Secondary | ICD-10-CM | POA: Insufficient documentation

## 2011-09-20 HISTORY — PX: UMBILICAL HERNIA REPAIR: SHX196

## 2011-09-20 SURGERY — REPAIR, HERNIA, UMBILICAL, ADULT
Anesthesia: General

## 2011-09-20 SURGERY — REPAIR, HERNIA, UMBILICAL, ADULT
Anesthesia: General | Site: Abdomen | Wound class: Clean

## 2011-09-20 MED ORDER — ONDANSETRON HCL 4 MG/2ML IJ SOLN: 4.0000 mg | Freq: Once | INTRAMUSCULAR | Status: DC | PRN

## 2011-09-20 MED ORDER — LIDOCAINE HCL (CARDIAC) 10 MG/ML IV SOLN
INTRAVENOUS | Status: DC | PRN
  Administered 2011-09-20: 50 mg via INTRAVENOUS

## 2011-09-20 MED ORDER — VANCOMYCIN HCL IN DEXTROSE 1-5 GM/200ML-% IV SOLN
INTRAVENOUS | Status: AC
  Filled 2011-09-20: qty 200

## 2011-09-20 MED ORDER — FENTANYL CITRATE 0.05 MG/ML IJ SOLN
INTRAMUSCULAR | Status: DC | PRN
  Administered 2011-09-20 (×4): 25 ug via INTRAVENOUS

## 2011-09-20 MED ORDER — BUPIVACAINE HCL (PF) 0.5 % IJ SOLN
INTRAMUSCULAR | Status: AC
  Filled 2011-09-20: qty 30

## 2011-09-20 MED ORDER — POVIDONE-IODINE 10 % EX OINT
TOPICAL_OINTMENT | CUTANEOUS | Status: AC
  Filled 2011-09-20: qty 1

## 2011-09-20 MED ORDER — GLYCOPYRROLATE 0.2 MG/ML IJ SOLN
INTRAMUSCULAR | Status: AC
  Filled 2011-09-20: qty 1

## 2011-09-20 MED ORDER — ETOMIDATE 2 MG/ML IV SOLN: INTRAVENOUS | Status: DC | PRN

## 2011-09-20 MED ORDER — GLYCOPYRROLATE 0.2 MG/ML IJ SOLN
0.2000 mg | Freq: Once | INTRAMUSCULAR | Status: AC
  Administered 2011-09-20: 0.2 mg via INTRAVENOUS

## 2011-09-20 MED ORDER — KETOROLAC TROMETHAMINE 30 MG/ML IJ SOLN
INTRAMUSCULAR | Status: AC
  Filled 2011-09-20: qty 1

## 2011-09-20 MED ORDER — KETOROLAC TROMETHAMINE 30 MG/ML IJ SOLN
30.0000 mg | Freq: Once | INTRAMUSCULAR | Status: AC
  Administered 2011-09-20: 30 mg via INTRAVENOUS

## 2011-09-20 MED ORDER — VANCOMYCIN HCL 1000 MG IV SOLR
1000.0000 mg | INTRAVENOUS | Status: DC | PRN
  Administered 2011-09-20: 1000 mg via INTRAVENOUS

## 2011-09-20 MED ORDER — VANCOMYCIN HCL IN DEXTROSE 1-5 GM/200ML-% IV SOLN: 1000.0000 mg | INTRAVENOUS | Status: DC

## 2011-09-20 MED ORDER — PROPOFOL 10 MG/ML IV EMUL
INTRAVENOUS | Status: AC
  Filled 2011-09-20: qty 20

## 2011-09-20 MED ORDER — PROPOFOL 10 MG/ML IV EMUL
INTRAVENOUS | Status: DC | PRN
  Administered 2011-09-20: 200 mg via INTRAVENOUS

## 2011-09-20 MED ORDER — LACTATED RINGERS IV SOLN
INTRAVENOUS | Status: DC | PRN
  Administered 2011-09-20: 11:00:00 via INTRAVENOUS

## 2011-09-20 MED ORDER — ONDANSETRON HCL 4 MG/2ML IJ SOLN
INTRAMUSCULAR | Status: AC
  Filled 2011-09-20: qty 2

## 2011-09-20 MED ORDER — POVIDONE-IODINE 10 % EX OINT
TOPICAL_OINTMENT | CUTANEOUS | Status: DC | PRN
  Administered 2011-09-20: 1 via TOPICAL

## 2011-09-20 MED ORDER — FENTANYL CITRATE 0.05 MG/ML IJ SOLN
25.0000 ug | INTRAMUSCULAR | Status: DC | PRN
  Administered 2011-09-20 (×2): 50 ug via INTRAVENOUS

## 2011-09-20 MED ORDER — MIDAZOLAM HCL 2 MG/2ML IJ SOLN
INTRAMUSCULAR | Status: AC
  Filled 2011-09-20: qty 2

## 2011-09-20 MED ORDER — ARTIFICIAL TEARS OP OINT
TOPICAL_OINTMENT | OPHTHALMIC | Status: AC
  Filled 2011-09-20: qty 3.5

## 2011-09-20 MED ORDER — FENTANYL CITRATE 0.05 MG/ML IJ SOLN
INTRAMUSCULAR | Status: AC
  Filled 2011-09-20: qty 2

## 2011-09-20 MED ORDER — ONDANSETRON HCL 4 MG/2ML IJ SOLN
4.0000 mg | Freq: Once | INTRAMUSCULAR | Status: AC
  Administered 2011-09-20: 4 mg via INTRAVENOUS

## 2011-09-20 MED ORDER — MIDAZOLAM HCL 2 MG/2ML IJ SOLN
1.0000 mg | INTRAMUSCULAR | Status: DC | PRN
  Administered 2011-09-20: 2 mg via INTRAVENOUS

## 2011-09-20 MED ORDER — VANCOMYCIN HCL IN DEXTROSE 1-5 GM/200ML-% IV SOLN: 1000.0000 mg | Freq: Once | INTRAVENOUS | Status: DC

## 2011-09-20 MED ORDER — VANCOMYCIN HCL 1000 MG IV SOLR
1500.0000 mg | Freq: Once | INTRAVENOUS | Status: DC
  Filled 2011-09-20: qty 1500

## 2011-09-20 MED ORDER — OXYCODONE-ACETAMINOPHEN 7.5-325 MG PO TABS: 1.0000 | ORAL_TABLET | ORAL | Status: DC | PRN

## 2011-09-20 MED ORDER — BUPIVACAINE HCL (PF) 0.5 % IJ SOLN
INTRAMUSCULAR | Status: DC | PRN
  Administered 2011-09-20: 10 mL

## 2011-09-20 MED ORDER — LACTATED RINGERS IV SOLN
INTRAVENOUS | Status: DC
  Administered 2011-09-20: 11:00:00 via INTRAVENOUS

## 2011-09-20 MED ORDER — 0.9 % SODIUM CHLORIDE (POUR BTL) OPTIME
TOPICAL | Status: DC | PRN
  Administered 2011-09-20: 1000 mL

## 2011-09-20 MED ORDER — LIDOCAINE HCL (PF) 1 % IJ SOLN
INTRAMUSCULAR | Status: AC
  Filled 2011-09-20: qty 5

## 2011-09-20 SURGICAL SUPPLY — 39 items
BAG HAMPER (MISCELLANEOUS) ×2 IMPLANT
BLADE SURG SZ11 CARB STEEL (BLADE) ×2 IMPLANT
CLOTH BEACON ORANGE TIMEOUT ST (SAFETY) ×2 IMPLANT
COVER LIGHT HANDLE STERIS (MISCELLANEOUS) ×4 IMPLANT
DECANTER SPIKE VIAL GLASS SM (MISCELLANEOUS) ×2 IMPLANT
DERMABOND ADVANCED (GAUZE/BANDAGES/DRESSINGS)
DERMABOND ADVANCED .7 DNX12 (GAUZE/BANDAGES/DRESSINGS) IMPLANT
DURAPREP 26ML APPLICATOR (WOUND CARE) ×2 IMPLANT
ELECT REM PT RETURN 9FT ADLT (ELECTROSURGICAL) ×2
ELECTRODE REM PT RTRN 9FT ADLT (ELECTROSURGICAL) ×1 IMPLANT
FORMALIN 10 PREFIL 120ML (MISCELLANEOUS) IMPLANT
GLOVE BIO SURGEON STRL SZ7.5 (GLOVE) ×2 IMPLANT
GLOVE BIOGEL PI IND STRL 7.0 (GLOVE) ×2 IMPLANT
GLOVE BIOGEL PI INDICATOR 7.0 (GLOVE) ×2
GLOVE SKINSENSE NS SZ6.5 (GLOVE) ×2
GLOVE SKINSENSE NS SZ7.5 (GLOVE) ×1
GLOVE SKINSENSE STRL SZ6.5 (GLOVE) ×2 IMPLANT
GLOVE SKINSENSE STRL SZ7.5 (GLOVE) ×1 IMPLANT
GOWN STRL REIN XL XLG (GOWN DISPOSABLE) ×6 IMPLANT
INST SET MINOR GENERAL (KITS) ×2 IMPLANT
KIT ROOM TURNOVER APOR (KITS) ×2 IMPLANT
MANIFOLD NEPTUNE II (INSTRUMENTS) ×2 IMPLANT
NEEDLE HYPO 25X1 1.5 SAFETY (NEEDLE) ×2 IMPLANT
NS IRRIG 1000ML POUR BTL (IV SOLUTION) ×2 IMPLANT
PACK MINOR (CUSTOM PROCEDURE TRAY) ×2 IMPLANT
PAD ARMBOARD 7.5X6 YLW CONV (MISCELLANEOUS) ×2 IMPLANT
SET BASIN LINEN APH (SET/KITS/TRAYS/PACK) ×2 IMPLANT
SPONGE GAUZE 2X2 8PLY STRL LF (GAUZE/BANDAGES/DRESSINGS) ×4 IMPLANT
STAPLER VISISTAT (STAPLE) ×2 IMPLANT
SUT ETHIBOND NAB MO 7 #0 18IN (SUTURE) ×2 IMPLANT
SUT VIC AB 2-0 CT2 27 (SUTURE) ×2 IMPLANT
SUT VIC AB 3-0 SH 27 (SUTURE) ×1
SUT VIC AB 3-0 SH 27X BRD (SUTURE) ×1 IMPLANT
SUT VIC AB 4-0 PS2 27 (SUTURE) IMPLANT
SUT VIC AB 5-0 P-3 18X BRD (SUTURE) IMPLANT
SUT VIC AB 5-0 P3 18 (SUTURE)
SUT VICRYL AB 3 0 TIES (SUTURE) IMPLANT
SYR CONTROL 10ML LL (SYRINGE) ×2 IMPLANT
TAPE CLOTH SURG 4X10 WHT LF (GAUZE/BANDAGES/DRESSINGS) ×2 IMPLANT

## 2011-09-20 NOTE — Anesthesia Preprocedure Evaluation (Signed)
Anesthesia Evaluation  Patient identified by MRN, date of birth, ID band Patient awake    Reviewed: Allergy & Precautions, H&P , NPO status , Patient's Chart, lab work & pertinent test results  Airway Mallampati: I TM Distance: >3 FB Neck ROM: Full    Dental No notable dental hx.    Pulmonary asthma , PE   Pulmonary exam normal       Cardiovascular negative cardio ROS  Rhythm:Regular Rate:Normal     Neuro/Psych  Headaches, PSYCHIATRIC DISORDERS Anxiety Depression    GI/Hepatic negative GI ROS, Neg liver ROS,   Endo/Other  negative endocrine ROS  Renal/GU negative Renal ROS     Musculoskeletal negative musculoskeletal ROS (+)   Abdominal Normal abdominal exam  (+)   Peds  Hematology negative hematology ROS (+)   Anesthesia Other Findings   Reproductive/Obstetrics negative OB ROS                           Anesthesia Physical Anesthesia Plan  ASA: II  Anesthesia Plan: General   Post-op Pain Management:    Induction: Intravenous  Airway Management Planned: LMA  Additional Equipment:   Intra-op Plan:   Post-operative Plan: Extubation in OR  Informed Consent: I have reviewed the patients History and Physical, chart, labs and discussed the procedure including the risks, benefits and alternatives for the proposed anesthesia with the patient or authorized representative who has indicated his/her understanding and acceptance.     Plan Discussed with: CRNA  Anesthesia Plan Comments:         Anesthesia Quick Evaluation

## 2011-09-20 NOTE — Op Note (Signed)
Patient:  Shelby [name]  DOB:  Jul 12, 1975  MRN:  [id number]   Preop Diagnosis:  Umbilical hernia  Postop Diagnosis:  Same  Procedure:  Umbilical herniorrhaphy  Surgeon:  Franky Macho, M.D.  Anes:  General  Indications:  Patient is a 36 year old white female presents with nonspecific abdominal pain and umbilical hernia. The risks and benefits of the procedure including bleeding, infection, recurrence of hernia, and recurrence of the abdominal pain were fully explained to the patient, gave informed consent.  Procedure note:  Patient is placed the supine position. After induction of general anesthesia, the abdomen was prepped and draped using usual sterile technique with DuraPrep. Surgical site confirmation was performed.  An infraumbilical incision was made down to the fascia. A long hernia was identified. The umbilicus was freed away from the underlying hernia sac. The hernia sac was narrow and small. This was excised and disposed of. No omentum was noted within the hernia sac. The fascia was then reapproximated transversely using 0 Ethibond interrupted sutures. The base the umbilicus was secured back to the abdominal wall using a 2-0 Vicryl interrupted suture. The subcutaneous layer was reapproximated using 3-0 Vicryl interrupted suture. The skin was closed using staples. 0.5% Sensorcaine was instilled the surrounding wound. Dermabond was then applied.  All tape and needle counts were correct at the end of the procedure. Patient was awakened and transferred to PACU in stable condition.  Complications:  None  EBL:  Minimal  Specimen:  None

## 2011-09-20 NOTE — Anesthesia Postprocedure Evaluation (Signed)
  Anesthesia Post-op Note  Patient: Shelby [name]  Procedure(s) Performed: Procedure(s) (LRB): HERNIA REPAIR UMBILICAL ADULT (N/A)  Patient Location: PACU  Anesthesia Type: General  Level of Consciousness: awake, alert , oriented and patient cooperative  Airway and Oxygen Therapy: Patient Spontanous Breathing and Patient connected to nasal cannula oxygen  Post-op Pain: mild  Post-op Assessment: Post-op Vital signs reviewed, Patient's Cardiovascular Status Stable, Respiratory Function Stable, Patent Airway and No signs of Nausea or vomiting  Post-op Vital Signs: Reviewed and stable  Complications: No apparent anesthesia complications

## 2011-09-20 NOTE — Interval H&P Note (Signed)
History and Physical Interval Note:  09/20/2011 10:56 AM  Shelby [name]  has presented today for surgery, with the diagnosis of umbilical hernia  The various methods of treatment have been discussed with the patient and family. After consideration of risks, benefits and other options for treatment, the patient has consented to  Procedure(s) (LRB): HERNIA REPAIR UMBILICAL ADULT (N/A) as a surgical intervention .  The patient's history has been reviewed, patient examined, no change in status, stable for surgery.  I have reviewed the patient's chart and labs.  Questions were answered to the patient's satisfaction.     Franky Macho A

## 2011-09-20 NOTE — Transfer of Care (Signed)
Immediate Anesthesia Transfer of Care Note  Patient: Shelby [name]  Procedure(s) Performed: Procedure(s) (LRB): HERNIA REPAIR UMBILICAL ADULT (N/A)  Patient Location: PACU  Anesthesia Type: General  Level of Consciousness: awake, alert , oriented and patient cooperative  Airway & Oxygen Therapy: Patient Spontanous Breathing and Patient connected to nasal cannula oxygen  Post-op Assessment: Report given to PACU RN and Post -op Vital signs reviewed and stable  Post vital signs: Reviewed and stable  Complications: No apparent anesthesia complications

## 2011-09-20 NOTE — Anesthesia Procedure Notes (Signed)
Procedure Name: LMA Insertion Date/Time: 09/20/2011 11:20 AM Performed by: Carolyne Littles, Aniya Jolicoeur L Pre-anesthesia Checklist: Patient identified, Timeout performed, Emergency Drugs available, Suction available and Patient being monitored Patient Re-evaluated:Patient Re-evaluated prior to inductionOxygen Delivery Method: Circle system utilized Preoxygenation: Pre-oxygenation with 100% oxygen Intubation Type: IV induction Ventilation: Mask ventilation without difficulty Number of attempts: 1 Placement Confirmation: positive ETCO2 and breath sounds checked- equal and bilateral

## 2011-09-20 NOTE — Preoperative (Signed)
Beta Blockers   Reason not to administer Beta Blockers:Not Applicable 

## 2011-09-20 NOTE — H&P (View-Only) (Signed)
  NTS SOAP Note  Vital Signs:  Vitals as of: 09/17/2011: Systolic 139: Diastolic 91: Heart Rate 90: Temp 98.14F: Height 63ft 9in: Weight 198Lbs 0 Ounces: Pain Level 5: BMI 29  BMI : 29.24 kg/m2  Subjective: This 28 Years 38 Months old Female presents for of    ABDOMINAL PAIN : ,Has been having lower abdominal pain, extending from below umbilicus to the pelvis bilaterally.  Not associated with food, specific movement, menstrual cycle.  No nausea, vomiting.  No masses noted.  Extensive workup including multiple gyn visits and GI consultation has been unrermarkable.  Review of Symptoms:  Constitutional:unremarkable      headache Eyes:  blurred vision bilateral Nose/Mouth/Throat:unremarkable Cardiovascular:  unremarkable   Respiratory:  dyspnea,wheezing     as above Genitourinary:unremarkable     Musculoskeletal:unremarkable   Skin:unremarkable Hematolgic/Lymphatic:unremarkable     Allergic/Immunologic:unremarkable     Past Medical History:    Reviewed   Past Medical History  Surgical History: 2 csections Psychiatric History:  Anxiety, Depression Allergies: pcn Medications: polymysin eye drops, namenda, amitiza, dorepezil   Social History:Reviewed  Social History  Preferred Language: English ([place]) Race:  White Ethnicity: Not Hispanic / Latino Age: 64 Years 7 Months Marital Status:  S Alcohol:  No Recreational drug(s):  No   Smoking Status: Current every day smoker reviewed on 09/17/2011 Started Date: 02/12/1995 Packs per day: 2.00   Family History:  Reviewed   Family History  Is there a family history ZO:[id number], DM, alzheimers, CAD    Objective Information: General:  Well appearing, well nourished in no distress. Head:Atraumatic; no masses; no abnormalities Heart:  RRR, no murmur Lungs:    CTA bilaterally, no wheezes, rhonchi, rales.  Breathing unlabored. Abdomen:Soft, NT/ND, normal  bowel sounds, no HSM, no masses.  No peritoneal signs.  Small umbilical hernia with protruding contents with straining. Back:unremarkable    Assessment:Umbilical hernia  Diagnosis &amp; Procedure: DiagnosisCode: 553.1, ProcedureCode: 54098,    Plan:Scheduled for umbilical herniorrhaphy on 09/20/11.  Patient and mother (who is a Engineer, civil (consulting)) fully realize that this may or may not relieve the patient of pain.  I do not feel that laparoscopy and/or incidental appendectomy are indicated at this time.  They understand and agree.   Patient Education:Alternative treatments to surgery were discussed with patient (and family).  Risks and benefits  of procedure were fully explained to the patient (and family) who gave informed consent. Patient/family questions were addressed.  Follow-up:Pending Surgery

## 2011-09-23 ENCOUNTER — Encounter (HOSPITAL_COMMUNITY): Payer: Self-pay | Admitting: General Surgery

## 2011-11-29 DIAGNOSIS — S069X0A Unspecified intracranial injury without loss of consciousness, initial encounter: Secondary | ICD-10-CM | POA: Diagnosis not present

## 2011-12-12 DIAGNOSIS — L905 Scar conditions and fibrosis of skin: Secondary | ICD-10-CM | POA: Diagnosis not present

## 2011-12-12 NOTE — H&P (Signed)
  NTS SOAP Note  Vital Signs:  Vitals as of: 12/12/2011: Systolic 147: Diastolic 95: Heart Rate 82: Temp 97.76F: Height 8ft 9in: Weight 195Lbs 0 Ounces: Pain Level 8: BMI 29  BMI : 28.8 kg/m2  Subjective: This 29 Years 44 Months old Female presents for pain at surgical scar site.  Had previous c-section in remote past and has developed tenderness at the scar over the past few years.  Is worsening recently.  Seen by GYN who does not feel it is pelvic in nature.  Recently umbilical surgery has taken care of upper abdominal pain.  No right lower quadrant pain noted. Does not have dysperunia, though intercourse is limited.  Review of Symptoms:  Constitutional:unremarkable   Head:unremarkable    Eyes:unremarkable   Nose/Mouth/Throat:unremarkable Cardiovascular:  unremarkable   Respiratory:unremarkable   Gastrointestinal:  unremarkable   Genitourinary:unremarkable     Hematolgic/Lymphatic:unremarkable     Allergic/Immunologic:unremarkable     Past Medical History:    Reviewed   Past Medical History  Surgical History: 2 csections Psychiatric History:  Anxiety, Depression Allergies: pcn Medications: polymysin eye drops, namenda, amitiza, dorepezil   Social History:Reviewed  Social History  Preferred Language: English ([place]) Race:  White Ethnicity: Not Hispanic / Latino Age: 36 Years 7 Months Marital Status:  S Alcohol:  No Recreational drug(s):  No   Smoking Status: Current every day smoker reviewed on 09/17/2011 Started Date: 02/12/1995 Packs per day: 2.00    Family History:  Reviewed   Family History  Is there a family history WU:[id number], DM, alzheimers, CAD    Objective Information: General:  Well appearing, well nourished in no distress. Heart:  RRR, no murmur Lungs:    CTA bilaterally, no wheezes, rhonchi, rales.  Breathing unlabored. Abdomen:Soft, no right lower quadrant tenderness, ND, no HSM, no  masses.Umbilical scar healing well.  No hernias appreciated.  Tender along irregular surgical transverse c-section scar.  Assessment:Surgical scar pain  Diagnosis &amp; Procedure: DiagnosisCode: 709.2, ProcedureCode: 47829,    Plan:Scheduled for wound exploration, surgical scar on 12/27/11.   Patient Education:Alternative treatments to surgery were discussed with patient (and family).  Risks and benefits  of procedure including recurrence of the pain were fully explained to the patient (and family) who gave informed consent. Patient/family questions were addressed.  Follow-up:Pending Surgery

## 2011-12-19 DIAGNOSIS — Z87820 Personal history of traumatic brain injury: Secondary | ICD-10-CM | POA: Diagnosis not present

## 2011-12-19 DIAGNOSIS — R413 Other amnesia: Secondary | ICD-10-CM | POA: Diagnosis not present

## 2011-12-19 DIAGNOSIS — G43909 Migraine, unspecified, not intractable, without status migrainosus: Secondary | ICD-10-CM | POA: Diagnosis not present

## 2011-12-19 NOTE — Patient Instructions (Addendum)
   20 Lawrence Sheron Nightingale  12/19/2011   Your procedure is scheduled on:  12/27/2011  Report to [facility] at   615  AM.  Call this number if you have problems the morning of surgery: [phone]   Remember:   Do not eat food:After Midnight.  May have clear liquids:until Midnight .    Take these medicines the morning of surgery with A SIP OF WATER: aricept,percocet. Take proventil before you come.   Do not wear jewelry, make-up or nail polish.  Do not wear lotions, powders, or perfumes.   Do not shave 48 hours prior to surgery. Men may shave face and neck.  Do not bring valuables to the hospital.  Contacts, dentures or bridgework may not be worn into surgery.  Leave suitcase in the car. After surgery it may be brought to your room.  For patients admitted to the hospital, checkout time is 11:00 AM the day of discharge.   Patients discharged the day of surgery will not be allowed to drive home.  Name and phone number of your driver:family  Special Instructions: Shower using CHG 2 nights before surgery and the night before surgery.  If you shower the day of surgery use CHG.  Use special wash - you have one bottle of CHG for all showers.  You should use approximately 1/3 of the bottle for each shower.   Please read over the following fact sheets that you were given: Pain Booklet, Coughing and Deep Breathing, MRSA Information, Surgical Site Infection Prevention, Anesthesia Post-op Instructions and Care and Recovery After Surgery PATIENT INSTRUCTIONS POST-ANESTHESIA  IMMEDIATELY FOLLOWING SURGERY:  Do not drive or operate machinery for the first twenty four hours after surgery.  Do not make any important decisions for twenty four hours after surgery or while taking narcotic pain medications or sedatives.  If you develop intractable nausea and vomiting or a severe headache please notify your doctor immediately.  FOLLOW-UP:  Please make an appointment with your surgeon as instructed. You do not need  to follow up with anesthesia unless specifically instructed to do so.  WOUND CARE INSTRUCTIONS (if applicable):  Keep a dry clean dressing on the anesthesia/puncture wound site if there is drainage.  Once the wound has quit draining you may leave it open to air.  Generally you should leave the bandage intact for twenty four hours unless there is drainage.  If the epidural site drains for more than 36-48 hours please call the anesthesia department.  QUESTIONS?:  Please feel free to call your physician or the hospital operator if you have any questions, and they will be happy to assist you.

## 2011-12-20 ENCOUNTER — Encounter (HOSPITAL_COMMUNITY): Payer: Self-pay | Admitting: Pharmacy Technician

## 2011-12-20 ENCOUNTER — Encounter (HOSPITAL_COMMUNITY): Payer: Self-pay

## 2011-12-20 ENCOUNTER — Other Ambulatory Visit: Payer: Self-pay

## 2011-12-20 ENCOUNTER — Encounter (HOSPITAL_COMMUNITY)
Admission: RE | Admit: 2011-12-20 | Discharge: 2011-12-20 | Disposition: A | Discharge status: Home / Self Care | Payer: Medicare Other | Source: Ambulatory Visit | Attending: General Surgery | Admitting: General Surgery

## 2011-12-20 DIAGNOSIS — L905 Scar conditions and fibrosis of skin: Secondary | ICD-10-CM | POA: Diagnosis not present

## 2011-12-20 DIAGNOSIS — K432 Incisional hernia without obstruction or gangrene: Secondary | ICD-10-CM | POA: Diagnosis not present

## 2011-12-20 HISTORY — DX: Unspecified osteoarthritis, unspecified site: M19.90

## 2011-12-20 HISTORY — DX: Other amnesia: R41.3

## 2011-12-20 LAB — BASIC METABOLIC PANEL
Chloride: 99 mEq/L (ref 96–112)
Creatinine, Ser: 0.71 mg/dL (ref 0.50–1.10)
GFR calc Af Amer: >90 mL/min (ref >90)
GFR calc non Af Amer: >90 mL/min (ref >90)
Potassium: 4.5 mEq/L (ref 3.5–5.1)

## 2011-12-20 LAB — CBC WITH DIFFERENTIAL/PLATELET
Basophils Absolute: 0.1 10*3/uL (ref 0.0–0.1)
Basophils Relative: 1 % (ref 0–1)
HCT: 42.8 % (ref 36.0–46.0)
Hemoglobin: 14.8 g/dL (ref 12.0–15.0)
Lymphocytes Relative: 25 % (ref 12–46)
Monocytes Absolute: 0.4 10*3/uL (ref 0.1–1.0)
Neutro Abs: 5.2 10*3/uL (ref 1.7–7.7)
Neutrophils Relative %: 62 % (ref 43–77)
RDW: 12.7 % (ref 11.5–15.5)
WBC: 8.4 10*3/uL (ref 4.0–10.5)

## 2011-12-20 LAB — HCG, SERUM, QUALITATIVE: Preg, Serum: NEGATIVE

## 2011-12-27 ENCOUNTER — Encounter (HOSPITAL_COMMUNITY): Payer: Self-pay | Admitting: Anesthesiology

## 2011-12-27 ENCOUNTER — Encounter (HOSPITAL_COMMUNITY)
Admission: RE | Disposition: A | Discharge status: Home / Self Care | Payer: Self-pay | Source: Ambulatory Visit | Attending: General Surgery

## 2011-12-27 ENCOUNTER — Encounter (HOSPITAL_COMMUNITY): Payer: Self-pay | Admitting: *Deleted

## 2011-12-27 ENCOUNTER — Ambulatory Visit (HOSPITAL_COMMUNITY)
Admission: RE | Admit: 2011-12-27 | Discharge: 2011-12-27 | Disposition: A | Discharge status: Home / Self Care | Payer: Medicare Other | Source: Ambulatory Visit | Attending: General Surgery | Admitting: General Surgery

## 2011-12-27 ENCOUNTER — Ambulatory Visit (HOSPITAL_COMMUNITY): Payer: Medicare Other | Admitting: Anesthesiology

## 2011-12-27 DIAGNOSIS — K432 Incisional hernia without obstruction or gangrene: Secondary | ICD-10-CM | POA: Diagnosis not present

## 2011-12-27 DIAGNOSIS — L905 Scar conditions and fibrosis of skin: Secondary | ICD-10-CM | POA: Diagnosis not present

## 2011-12-27 HISTORY — PX: INCISIONAL HERNIA REPAIR: SHX193

## 2011-12-27 HISTORY — PX: WOUND DEBRIDEMENT: SHX247

## 2011-12-27 HISTORY — PX: INSERTION OF MESH: SHX5868

## 2011-12-27 SURGERY — REPAIR, HERNIA, INCISIONAL
Anesthesia: General | Site: Abdomen | Wound class: Clean

## 2011-12-27 MED ORDER — CLINDAMYCIN PHOSPHATE 600 MG/50ML IV SOLN
INTRAVENOUS | Status: DC | PRN
  Administered 2011-12-27: 600 mg via INTRAVENOUS

## 2011-12-27 MED ORDER — MIDAZOLAM HCL 2 MG/2ML IJ SOLN
1.0000 mg | INTRAMUSCULAR | Status: DC | PRN
  Administered 2011-12-27: 2 mg via INTRAVENOUS

## 2011-12-27 MED ORDER — HYDROMORPHONE HCL PF 1 MG/ML IJ SOLN
0.2500 mg | INTRAMUSCULAR | Status: DC | PRN
  Administered 2011-12-27: 0.5 mg via INTRAVENOUS

## 2011-12-27 MED ORDER — MIDAZOLAM HCL 5 MG/5ML IJ SOLN
INTRAMUSCULAR | Status: DC | PRN
  Administered 2011-12-27: 2 mg via INTRAVENOUS

## 2011-12-27 MED ORDER — LIDOCAINE HCL (PF) 1 % IJ SOLN
INTRAMUSCULAR | Status: AC
  Filled 2011-12-27: qty 5

## 2011-12-27 MED ORDER — FENTANYL CITRATE 0.05 MG/ML IJ SOLN
INTRAMUSCULAR | Status: AC
  Filled 2011-12-27: qty 2

## 2011-12-27 MED ORDER — LIDOCAINE HCL 1 % IJ SOLN
INTRAMUSCULAR | Status: DC | PRN
  Administered 2011-12-27: 50 mg via INTRADERMAL

## 2011-12-27 MED ORDER — POVIDONE-IODINE 10 % EX OINT
TOPICAL_OINTMENT | CUTANEOUS | Status: AC
  Filled 2011-12-27: qty 2

## 2011-12-27 MED ORDER — HYDROMORPHONE HCL PF 1 MG/ML IJ SOLN
INTRAMUSCULAR | Status: AC
  Filled 2011-12-27: qty 1

## 2011-12-27 MED ORDER — SODIUM CHLORIDE 0.9 % IR SOLN
Status: DC | PRN
  Administered 2011-12-27: 1000 mL

## 2011-12-27 MED ORDER — ONDANSETRON HCL 4 MG/2ML IJ SOLN
INTRAMUSCULAR | Status: AC
  Filled 2011-12-27: qty 2

## 2011-12-27 MED ORDER — MIDAZOLAM HCL 2 MG/2ML IJ SOLN
INTRAMUSCULAR | Status: AC
  Filled 2011-12-27: qty 2

## 2011-12-27 MED ORDER — FENTANYL CITRATE 0.05 MG/ML IJ SOLN
INTRAMUSCULAR | Status: DC | PRN
  Administered 2011-12-27 (×2): 50 ug via INTRAVENOUS
  Administered 2011-12-27 (×2): 25 ug via INTRAVENOUS
  Administered 2011-12-27: 50 ug via INTRAVENOUS

## 2011-12-27 MED ORDER — CLINDAMYCIN PHOSPHATE 600 MG/50ML IV SOLN
600.0000 mg | Freq: Once | INTRAVENOUS | Status: DC
  Filled 2011-12-27: qty 50

## 2011-12-27 MED ORDER — PROPOFOL 10 MG/ML IV BOLUS
INTRAVENOUS | Status: DC | PRN
  Administered 2011-12-27: 150 mg via INTRAVENOUS

## 2011-12-27 MED ORDER — POVIDONE-IODINE 10 % EX OINT
TOPICAL_OINTMENT | CUTANEOUS | Status: DC | PRN
  Administered 2011-12-27: 2 via TOPICAL

## 2011-12-27 MED ORDER — CLINDAMYCIN PHOSPHATE 600 MG/50ML IV SOLN
INTRAVENOUS | Status: AC
  Filled 2011-12-27: qty 50

## 2011-12-27 MED ORDER — PROPOFOL 10 MG/ML IV EMUL
INTRAVENOUS | Status: AC
  Filled 2011-12-27: qty 20

## 2011-12-27 MED ORDER — KETOROLAC TROMETHAMINE 30 MG/ML IJ SOLN
30.0000 mg | Freq: Once | INTRAMUSCULAR | Status: AC
  Administered 2011-12-27: 30 mg via INTRAVENOUS

## 2011-12-27 MED ORDER — KETOROLAC TROMETHAMINE 30 MG/ML IJ SOLN
INTRAMUSCULAR | Status: AC
  Filled 2011-12-27: qty 1

## 2011-12-27 MED ORDER — OXYCODONE-ACETAMINOPHEN 7.5-325 MG PO TABS: 1.0000 | ORAL_TABLET | ORAL | Status: DC | PRN

## 2011-12-27 MED ORDER — CLINDAMYCIN PHOSPHATE 300 MG/2ML IJ SOLN: 600.0000 mg | Freq: Once | INTRAMUSCULAR | Status: DC

## 2011-12-27 MED ORDER — CHLORHEXIDINE GLUCONATE 4 % EX LIQD: 1.0000 "application " | Freq: Once | CUTANEOUS | Status: DC

## 2011-12-27 MED ORDER — FENTANYL CITRATE 0.05 MG/ML IJ SOLN
25.0000 ug | INTRAMUSCULAR | Status: DC | PRN
  Administered 2011-12-27 (×4): 50 ug via INTRAVENOUS

## 2011-12-27 MED ORDER — ONDANSETRON HCL 4 MG/2ML IJ SOLN
4.0000 mg | Freq: Once | INTRAMUSCULAR | Status: AC | PRN
  Administered 2011-12-27: 4 mg via INTRAVENOUS

## 2011-12-27 MED ORDER — LACTATED RINGERS IV SOLN
INTRAVENOUS | Status: DC
  Administered 2011-12-27: 1000 mL via INTRAVENOUS

## 2011-12-27 SURGICAL SUPPLY — 33 items
BAG HAMPER (MISCELLANEOUS) ×3 IMPLANT
CLOTH BEACON ORANGE TIMEOUT ST (SAFETY) ×3 IMPLANT
COVER LIGHT HANDLE STERIS (MISCELLANEOUS) ×6 IMPLANT
DURAPREP 26ML APPLICATOR (WOUND CARE) ×3 IMPLANT
ELECT REM PT RETURN 9FT ADLT (ELECTROSURGICAL) ×3
ELECTRODE REM PT RTRN 9FT ADLT (ELECTROSURGICAL) ×2 IMPLANT
GLOVE BIOGEL PI IND STRL 7.5 (GLOVE) ×4 IMPLANT
GLOVE BIOGEL PI INDICATOR 7.5 (GLOVE) ×2
GLOVE SKINSENSE NS SZ7.0 (GLOVE) ×1
GLOVE SKINSENSE NS SZ7.5 (GLOVE) ×1
GLOVE SKINSENSE STRL SZ7.0 (GLOVE) ×2 IMPLANT
GLOVE SKINSENSE STRL SZ7.5 (GLOVE) ×2 IMPLANT
GOWN STRL REIN XL XLG (GOWN DISPOSABLE) ×9 IMPLANT
INST SET MAJOR GENERAL (KITS) ×3 IMPLANT
KIT REMOVER STAPLE SKIN (MISCELLANEOUS) IMPLANT
KIT ROOM TURNOVER APOR (KITS) ×3 IMPLANT
MANIFOLD NEPTUNE II (INSTRUMENTS) ×3 IMPLANT
MESH HERNIA 3X6 (Mesh General) ×3 IMPLANT
NS IRRIG 1000ML POUR BTL (IV SOLUTION) ×3 IMPLANT
PACK ABDOMINAL MAJOR (CUSTOM PROCEDURE TRAY) ×3 IMPLANT
PAD ARMBOARD 7.5X6 YLW CONV (MISCELLANEOUS) ×3 IMPLANT
SET BASIN LINEN APH (SET/KITS/TRAYS/PACK) ×3 IMPLANT
SPONGE GAUZE 4X4 12PLY (GAUZE/BANDAGES/DRESSINGS) ×3 IMPLANT
SPONGE LAP 18X18 X RAY DECT (DISPOSABLE) ×3 IMPLANT
STAPLER VISISTAT (STAPLE) ×3 IMPLANT
SUCTION POOLE TIP (SUCTIONS) IMPLANT
SUT CHROMIC 0 SH (SUTURE) IMPLANT
SUT CHROMIC 2 0 SH (SUTURE) IMPLANT
SUT NOVA NAB GS-26 0 60 (SUTURE) IMPLANT
SUT VIC AB 0 CT2 8-18 (SUTURE) ×6 IMPLANT
SUT VIC AB 2-0 CT1 27 (SUTURE) ×3
SUT VIC AB 2-0 CT1 TAPERPNT 27 (SUTURE) ×6 IMPLANT
TAPE CLOTH SURG 4X10 WHT LF (GAUZE/BANDAGES/DRESSINGS) ×3 IMPLANT

## 2011-12-27 NOTE — Progress Notes (Signed)
Awake. Moaning/groaning. C/O postop abd pain. IV fluids warmed.

## 2011-12-27 NOTE — Progress Notes (Signed)
Awake. No resp distress. Asking to use albuterol inhaler. Dr Jayme Cloud notified. Okay to use inhaler. 2 puffs of albuterol in done per pt. toleated well. O2 sat 96% on RA.

## 2011-12-27 NOTE — Anesthesia Procedure Notes (Signed)
Procedure Name: LMA Insertion Date/Time: 12/27/2011 7:45 AM Performed by: Despina Hidden Pre-anesthesia Checklist: Emergency Drugs available, Patient identified, Patient being monitored and Suction available Patient Re-evaluated:Patient Re-evaluated prior to inductionOxygen Delivery Method: Circle system utilized Preoxygenation: Pre-oxygenation with 100% oxygen Intubation Type: IV induction Ventilation: Mask ventilation without difficulty LMA: LMA inserted LMA Size: 3.0 Number of attempts: 1 Tube secured with: Tape Dental Injury: Teeth and Oropharynx as per pre-operative assessment

## 2011-12-27 NOTE — Transfer of Care (Signed)
Immediate Anesthesia Transfer of Care Note  Patient: Shelby [name]  Procedure(s) Performed: Procedure(s) (LRB) with comments: HERNIA REPAIR INCISIONAL (N/A) DEBRIDEMENT ABDOMINAL WOUND (N/A) INSERTION OF MESH (N/A)  Patient Location: PACU  Anesthesia Type:General  Level of Consciousness: awake and patient cooperative  Airway & Oxygen Therapy: Patient Spontanous Breathing and Patient connected to face mask oxygen  Post-op Assessment: Report given to PACU RN, Post -op Vital signs reviewed and stable and Patient moving all extremities  Post vital signs: stable  Complications: No apparent anesthesia complications

## 2011-12-27 NOTE — Anesthesia Postprocedure Evaluation (Signed)
  Anesthesia Post-op Note  Patient: Shelby [name]  Procedure(s) Performed: Procedure(s) (LRB) with comments: HERNIA REPAIR INCISIONAL (N/A) DEBRIDEMENT ABDOMINAL WOUND (N/A) INSERTION OF MESH (N/A)  Patient Location: PACU  Anesthesia Type:General  Level of Consciousness: awake, alert , oriented and patient cooperative  Airway and Oxygen Therapy: Patient Spontanous Breathing  Post-op Pain: 3 /10, mild  Post-op Assessment: Post-op Vital signs reviewed, Patient's Cardiovascular Status Stable, Respiratory Function Stable, Patent Airway, No signs of Nausea or vomiting and Pain level controlled  Post-op Vital Signs: Reviewed and stable  Complications: No apparent anesthesia complications

## 2011-12-27 NOTE — Anesthesia Preprocedure Evaluation (Signed)
Anesthesia Evaluation  Patient identified by MRN, date of birth, ID band Patient awake    Reviewed: Allergy & Precautions, H&P , NPO status , Patient's Chart, lab work & pertinent test results  Airway Mallampati: I TM Distance: >3 FB Neck ROM: Full    Dental No notable dental hx.    Pulmonary asthma , Current Smoker, PE   Pulmonary exam normal       Cardiovascular negative cardio ROS  Rhythm:Regular Rate:Normal     Neuro/Psych  Headaches, PSYCHIATRIC DISORDERS Anxiety Depression    GI/Hepatic negative GI ROS, Neg liver ROS,   Endo/Other  negative endocrine ROS  Renal/GU negative Renal ROS     Musculoskeletal negative musculoskeletal ROS (+)   Abdominal Normal abdominal exam  (+)   Peds  Hematology negative hematology ROS (+)   Anesthesia Other Findings   Reproductive/Obstetrics negative OB ROS                           Anesthesia Physical Anesthesia Plan  ASA: II  Anesthesia Plan: General   Post-op Pain Management:    Induction: Intravenous  Airway Management Planned: LMA  Additional Equipment:   Intra-op Plan:   Post-operative Plan: Extubation in OR  Informed Consent: I have reviewed the patients History and Physical, chart, labs and discussed the procedure including the risks, benefits and alternatives for the proposed anesthesia with the patient or authorized representative who has indicated his/her understanding and acceptance.     Plan Discussed with: CRNA  Anesthesia Plan Comments:         Anesthesia Quick Evaluation

## 2011-12-27 NOTE — Interval H&P Note (Signed)
History and Physical Interval Note:  12/27/2011 7:29 AM  Shelby [name] Shelby [name]  has presented today for surgery, with the diagnosis of Painful Scar  The various methods of treatment have been discussed with the patient and family. After consideration of risks, benefits and other options for treatment, the patient has consented to  Procedure(s) (LRB) with comments: WOUND EXPLORATION (N/A) as a surgical intervention .  The patient's history has been reviewed, patient examined, no change in status, stable for surgery.  I have reviewed the patient's chart and labs.  Questions were answered to the patient's satisfaction.     Franky Macho A

## 2011-12-27 NOTE — Op Note (Signed)
Patient:  Shelby [name]  DOB:  10/27/1975  MRN:  [id number]   Preop Diagnosis:  Incisional pain, cicatrix formation  Postop Diagnosis:  Same, incisional hernia  Procedure:  Debridement of incision, incisional herniorrhaphy with mesh  Surgeon:  Franky Macho, M.D.  Anes:  General endotracheal  Indications:  Patient is a 36 year old white female status post multiple C-sections through a Pfannenstiel incision who now presents with chronic incisional pain. The patient comes to the operating room for a wound exploration and debridement. The risks and benefits of the procedure including bleeding, infection, and recurrence of her pain were fully explained to the patient, gave informed consent.  Procedure note:  The patient was placed in the supine position. After induction of general endotracheal anesthesia, the lower abdomen and suprapubic region were prepped and draped using usual sterile technique with DuraPrep. Surgical site confirmation was performed.  The previous Pfannenstiel surgical scar was excised down to the subcutaneous tissue. The dissection was taken down to the abdominal wall. A significant amount of cicatrix formation was noted along the lower rectus abdominous muscle, including its attachment to the pubic bone. This tissue was excised and debrided sharply. The subcutaneous tissue overlying this was also freed away from the scar formation. The anterior rectus sheath was divided transversely in order to facilitate this exposure. Given the significant involvement of the rectus muscle and abdominal wall laterally, the hernia defect was created with multiple areas of laxity of the abdominal wall. The peritoneum was never entered into. It was elected to place an onlay Bard 3 x 6" piece of mesh. The anterior fascia was then reapproximated over this using 0 Vicryl interrupted sutures. The subcutaneous layer was reapproximated using 2-0 Vicryl interrupted sutures. The skin was closed using  staples. Betadine ointment and dry sterile dressings were applied.  All tape and needle counts were correct at the end of the procedure. Patient was extubated in the operating room and transferred to PACU in stable condition.  Complications:  None  EBL:  Minimal  Specimen:  Cicatrix of incision

## 2011-12-30 ENCOUNTER — Encounter (HOSPITAL_COMMUNITY): Payer: Self-pay | Admitting: General Surgery

## 2012-02-18 DIAGNOSIS — G43909 Migraine, unspecified, not intractable, without status migrainosus: Secondary | ICD-10-CM | POA: Diagnosis not present

## 2012-02-18 DIAGNOSIS — R413 Other amnesia: Secondary | ICD-10-CM | POA: Diagnosis not present

## 2012-05-15 ENCOUNTER — Telehealth: Payer: Self-pay | Admitting: *Deleted

## 2012-05-15 NOTE — Telephone Encounter (Signed)
Spoke to patient to confirm appointment that is scheduled for 05-18-12 to see CM

## 2012-05-18 ENCOUNTER — Ambulatory Visit (INDEPENDENT_AMBULATORY_CARE_PROVIDER_SITE_OTHER): Payer: Medicare Other | Admitting: Nurse Practitioner

## 2012-05-18 ENCOUNTER — Encounter: Payer: Self-pay | Admitting: Nurse Practitioner

## 2012-05-18 VITALS — BP 128/82 | HR 91 | Ht 68.0 in | Wt 213.0 lb

## 2012-05-18 DIAGNOSIS — S069X9S Unspecified intracranial injury with loss of consciousness of unspecified duration, sequela: Secondary | ICD-10-CM

## 2012-05-18 DIAGNOSIS — S069X0S Unspecified intracranial injury without loss of consciousness, sequela: Secondary | ICD-10-CM

## 2012-05-18 DIAGNOSIS — G43909 Migraine, unspecified, not intractable, without status migrainosus: Secondary | ICD-10-CM

## 2012-05-18 DIAGNOSIS — S069X9A Unspecified intracranial injury with loss of consciousness of unspecified duration, initial encounter: Secondary | ICD-10-CM | POA: Insufficient documentation

## 2012-05-18 DIAGNOSIS — R413 Other amnesia: Secondary | ICD-10-CM | POA: Diagnosis not present

## 2012-05-18 MED ORDER — MEMANTINE HCL ER 7 & 14 & 21 &28 MG PO CP24: 1.0000 | ORAL_CAPSULE | Freq: Every day | ORAL | Status: DC

## 2012-05-18 NOTE — Patient Instructions (Addendum)
Namenda extended release titration pack given  30 day trial offer  With  coupon given call for refills Since she has failed Aricept in the past we will probably have to get a prior authorization for Medicare Follow up in 6 months Limit the caffeine if at all possible
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
## 2012-05-22 ENCOUNTER — Encounter: Payer: Self-pay | Admitting: Neurology

## 2012-05-22 ENCOUNTER — Telehealth: Payer: Self-pay

## 2012-05-22 DIAGNOSIS — J4489 Other specified chronic obstructive pulmonary disease: Secondary | ICD-10-CM | POA: Insufficient documentation

## 2012-05-22 DIAGNOSIS — F419 Anxiety disorder, unspecified: Secondary | ICD-10-CM | POA: Insufficient documentation

## 2012-05-22 DIAGNOSIS — J45909 Unspecified asthma, uncomplicated: Secondary | ICD-10-CM | POA: Insufficient documentation

## 2012-05-22 DIAGNOSIS — M1990 Unspecified osteoarthritis, unspecified site: Secondary | ICD-10-CM | POA: Insufficient documentation

## 2012-05-22 DIAGNOSIS — R413 Other amnesia: Secondary | ICD-10-CM | POA: Insufficient documentation

## 2012-05-22 DIAGNOSIS — F329 Major depressive disorder, single episode, unspecified: Secondary | ICD-10-CM | POA: Insufficient documentation

## 2012-05-22 DIAGNOSIS — G43909 Migraine, unspecified, not intractable, without status migrainosus: Secondary | ICD-10-CM | POA: Insufficient documentation

## 2012-05-22 NOTE — Telephone Encounter (Signed)
Pt can come in for IV Depacon if you can get back with her.

## 2012-05-22 NOTE — Telephone Encounter (Signed)
Patient  Called back told her about Depacon. Patient does not want to do this. Patient states she wants to take something by mouth. I told patient it would be Monday 05/25/2012. Patient was fine to wait for Eber Jones to return.

## 2012-05-22 NOTE — Telephone Encounter (Signed)
Patient calling states  She is having a lot of migraines. Called patient to get a little more information got her voice mail. Asked patient to call back to the office.

## 2012-05-25 MED ORDER — PREDNISONE 10 MG PO TABS: ORAL_TABLET | ORAL | Status: DC

## 2012-05-25 NOTE — Telephone Encounter (Signed)
Patient is wanting something for her memory since her other medicine was giving her headaches. Please advise

## 2012-05-25 NOTE — Telephone Encounter (Signed)
She can take a dose pack of Prednisone. Already called in

## 2012-05-25 NOTE — Telephone Encounter (Signed)
See below, she has failed Aricept in the past, Takes 1 month to get to prescribed dose of Namenda.

## 2012-05-25 NOTE — Telephone Encounter (Signed)
She was started on Namenda at her visit

## 2012-07-09 NOTE — Telephone Encounter (Signed)
Can you sch. This patient for Apt with Eber Jones in 6 months she was seen last in April. So we can close encounter.

## 2012-08-13 ENCOUNTER — Telehealth: Payer: Self-pay | Admitting: Neurology

## 2012-08-13 MED ORDER — RIZATRIPTAN BENZOATE 10 MG PO TABS: 10.0000 mg | ORAL_TABLET | ORAL | Status: DC | PRN

## 2012-08-13 NOTE — Telephone Encounter (Signed)
Refill on migraine meds has been sent.

## 2012-08-18 ENCOUNTER — Ambulatory Visit (INDEPENDENT_AMBULATORY_CARE_PROVIDER_SITE_OTHER): Payer: Medicare Other | Admitting: Family Medicine

## 2012-08-18 ENCOUNTER — Encounter: Payer: Self-pay | Admitting: Family Medicine

## 2012-08-18 VITALS — BP 117/79 | HR 75 | Temp 99.2°F | Wt 210.8 lb

## 2012-08-18 DIAGNOSIS — S39012A Strain of muscle, fascia and tendon of lower back, initial encounter: Secondary | ICD-10-CM

## 2012-08-18 MED ORDER — PREDNISONE 50 MG PO TABS: ORAL_TABLET | ORAL | Status: DC

## 2012-08-18 MED ORDER — CYCLOBENZAPRINE HCL 10 MG PO TABS: 10.0000 mg | ORAL_TABLET | Freq: Three times a day (TID) | ORAL | Status: DC | PRN
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
## 2012-08-28 ENCOUNTER — Encounter: Payer: Self-pay | Admitting: General Practice

## 2012-08-28 ENCOUNTER — Ambulatory Visit (INDEPENDENT_AMBULATORY_CARE_PROVIDER_SITE_OTHER): Payer: Medicare Other | Admitting: General Practice

## 2012-08-28 VITALS — BP 121/82 | HR 78 | Temp 97.4°F | Ht 67.0 in | Wt 208.0 lb

## 2012-08-28 DIAGNOSIS — G5600 Carpal tunnel syndrome, unspecified upper limb: Secondary | ICD-10-CM

## 2012-08-28 DIAGNOSIS — M62838 Other muscle spasm: Secondary | ICD-10-CM | POA: Diagnosis not present

## 2012-08-28 MED ORDER — CYCLOBENZAPRINE HCL 10 MG PO TABS: 10.0000 mg | ORAL_TABLET | Freq: Three times a day (TID) | ORAL | Status: DC | PRN

## 2012-08-28 NOTE — Addendum Note (Signed)
Addended byPhilomena Doheny E on: 08/28/2012 02:46 PM   Modules accepted: Orders

## 2012-08-28 NOTE — Patient Instructions (Signed)
Carpal Tunnel Syndrome  The carpal tunnel is an area under the skin of the palm of your hand. Nerves, blood vessels, and strong tissues (tendons) pass through the tunnel. The tunnel can become puffy (swollen). If this happens, a nerve can be pinched in the wrist. This causes carpal tunnel syndrome.   HOME CARE   Take all medicine as told by your doctor.   If you were given a splint, wear it as told. Wear it at night or at times when your doctor told you to.   Rest your wrist from the activity that causes your pain.   Put ice on your wrist after long periods of wrist activity.   Put ice in a plastic bag.   Place a towel between your skin and the bag.   Leave the ice on for 15-20 minutes, 3-4 times a day.   Keep all doctor visits as told.  GET HELP RIGHT AWAY IF:   You have new problems you cannot explain.   Your problems get worse and medicine does not help.  MAKE SURE YOU:    Understand these instructions.   Will watch your condition.   Will get help right away if you are not doing well or get worse.  Document Released: 01/17/2011 Document Revised: 04/22/2011 Document Reviewed: 01/17/2011  [employer] Patient Information 2014 [employer].

## 2012-08-28 NOTE — Progress Notes (Addendum)
  Subjective:    Patient ID: Shelby [name], female    DOB: 10-26-1975, 37 y.o.   MRN: [id number]  Shoulder Pain  The pain is present in the left wrist, left arm, right wrist, right arm and right shoulder. This is a recurrent problem. The current episode started more than 1 month ago. There has been no history of extremity trauma. The problem occurs constantly. The problem has been rapidly worsening. The quality of the pain is described as aching and sharp. The pain is at a severity of 4/10. The pain is mild. Associated symptoms include numbness. Pertinent negatives include no fever, inability to bear weight, joint swelling or stiffness. She has tried heat and NSAIDS for the symptoms. Family history does not include gout. Her past medical history is significant for osteoarthritis.  She reports having carpal tunnel and 4 surgeries on wrist in the past. Reports having periods of hot flashes, which began 6 months ago. She reports always having irregular menstrual cycles. She reports her menstrual cycle ended today. She reports her mother went through menapause at 92 years old.     Review of Systems  Constitutional: Negative for fever and chills.  HENT: Negative for neck pain and neck stiffness.   Respiratory: Negative for chest tightness and shortness of breath.   Cardiovascular: Negative for chest pain and palpitations.  Musculoskeletal: Negative for stiffness.       Reports having carpal tunnel  Neurological: Positive for numbness. Negative for dizziness and weakness.       In both hand, but worse in right near surgical incision area       Objective:   Physical Exam  Constitutional: She is oriented to person, place, and time. She appears well-developed and well-nourished.  Cardiovascular: Normal rate and regular rhythm.   Pulmonary/Chest: Effort normal and breath sounds normal.  Musculoskeletal: She exhibits tenderness.  Tenderness noted to bilateral wrist areas Negative tenderness with  raising of bilateral shoulders or palpation  Neurological: She is alert and oriented to person, place, and time.  Skin: Skin is warm and dry.  Psychiatric: She has a normal mood and affect.          Assessment & Plan:  1. Muscle spasm -Take medications as instructed -use warm compresses to relax muscles as instructed  -May take ibuprofen as directed -referral made to ortho -RTO if symptoms worsen or seek emergency medical attention Patient verbalized understanding Coralie Keens, FNP-C

## 2012-10-20 ENCOUNTER — Other Ambulatory Visit: Payer: Self-pay | Admitting: Family Medicine

## 2012-10-22 ENCOUNTER — Ambulatory Visit (INDEPENDENT_AMBULATORY_CARE_PROVIDER_SITE_OTHER): Payer: Medicare Other

## 2012-10-22 ENCOUNTER — Other Ambulatory Visit: Payer: Self-pay | Admitting: Orthopedic Surgery

## 2012-10-22 DIAGNOSIS — R52 Pain, unspecified: Secondary | ICD-10-CM

## 2012-10-22 DIAGNOSIS — M542 Cervicalgia: Secondary | ICD-10-CM | POA: Diagnosis not present

## 2012-10-22 DIAGNOSIS — M5030 Other cervical disc degeneration, unspecified cervical region: Secondary | ICD-10-CM | POA: Diagnosis not present

## 2012-10-23 NOTE — Telephone Encounter (Signed)
Last office visit 10/13, last pap on file 2011

## 2012-10-23 NOTE — Telephone Encounter (Signed)
Prescription renewed in EPIC. 

## 2012-10-26 NOTE — Telephone Encounter (Signed)
[facility] notified

## 2012-10-29 ENCOUNTER — Other Ambulatory Visit: Payer: Self-pay | Admitting: Family Medicine

## 2012-11-04 ENCOUNTER — Telehealth: Payer: Self-pay | Admitting: Family Medicine

## 2012-11-04 NOTE — Telephone Encounter (Signed)
CALLED [facility] AND THEY DO HAVE A REFFILL FOR MEDICINE. PT NOTIFIED IT WAS REFILLED ON 10/23/12.

## 2012-11-18 DIAGNOSIS — N920 Excessive and frequent menstruation with regular cycle: Secondary | ICD-10-CM | POA: Diagnosis not present

## 2012-12-01 ENCOUNTER — Other Ambulatory Visit: Payer: Self-pay | Admitting: Family Medicine

## 2012-12-02 NOTE — Telephone Encounter (Signed)
ntbs for pap 

## 2012-12-02 NOTE — Telephone Encounter (Signed)
[facility] told pt, she needed to be seen on last refill?

## 2012-12-06 ENCOUNTER — Other Ambulatory Visit: Payer: Self-pay | Admitting: General Practice

## 2012-12-10 ENCOUNTER — Ambulatory Visit: Payer: Medicare Other | Admitting: Nurse Practitioner

## 2012-12-25 ENCOUNTER — Emergency Department (HOSPITAL_COMMUNITY)
Admission: EM | Admit: 2012-12-25 | Discharge: 2012-12-25 | Disposition: A | Discharge status: Home / Self Care | Payer: Medicare Other | Attending: Emergency Medicine | Admitting: Emergency Medicine

## 2012-12-25 ENCOUNTER — Encounter (HOSPITAL_COMMUNITY): Payer: Self-pay | Admitting: Emergency Medicine

## 2012-12-25 DIAGNOSIS — Z79899 Other long term (current) drug therapy: Secondary | ICD-10-CM | POA: Insufficient documentation

## 2012-12-25 DIAGNOSIS — Z880 Allergy status to penicillin: Secondary | ICD-10-CM | POA: Insufficient documentation

## 2012-12-25 DIAGNOSIS — M129 Arthropathy, unspecified: Secondary | ICD-10-CM | POA: Insufficient documentation

## 2012-12-25 DIAGNOSIS — Z87828 Personal history of other (healed) physical injury and trauma: Secondary | ICD-10-CM | POA: Insufficient documentation

## 2012-12-25 DIAGNOSIS — G43909 Migraine, unspecified, not intractable, without status migrainosus: Secondary | ICD-10-CM | POA: Diagnosis not present

## 2012-12-25 DIAGNOSIS — M545 Low back pain, unspecified: Secondary | ICD-10-CM | POA: Insufficient documentation

## 2012-12-25 DIAGNOSIS — J45909 Unspecified asthma, uncomplicated: Secondary | ICD-10-CM | POA: Insufficient documentation

## 2012-12-25 DIAGNOSIS — Z8659 Personal history of other mental and behavioral disorders: Secondary | ICD-10-CM | POA: Diagnosis not present

## 2012-12-25 DIAGNOSIS — M549 Dorsalgia, unspecified: Secondary | ICD-10-CM

## 2012-12-25 DIAGNOSIS — Z87820 Personal history of traumatic brain injury: Secondary | ICD-10-CM | POA: Insufficient documentation

## 2012-12-25 DIAGNOSIS — F17200 Nicotine dependence, unspecified, uncomplicated: Secondary | ICD-10-CM | POA: Insufficient documentation

## 2012-12-25 DIAGNOSIS — Z91040 Latex allergy status: Secondary | ICD-10-CM | POA: Insufficient documentation

## 2012-12-25 MED ORDER — IBUPROFEN 600 MG PO TABS: 600.0000 mg | ORAL_TABLET | Freq: Three times a day (TID) | ORAL | Status: DC | PRN

## 2012-12-25 MED ORDER — IBUPROFEN 400 MG PO TABS
600.0000 mg | ORAL_TABLET | Freq: Once | ORAL | Status: AC
  Administered 2012-12-25: 600 mg via ORAL
  Filled 2012-12-25: qty 2

## 2012-12-25 MED ORDER — ACETAMINOPHEN 325 MG PO TABS
650.0000 mg | ORAL_TABLET | Freq: Once | ORAL | Status: AC
  Administered 2012-12-25: 650 mg via ORAL
  Filled 2012-12-25: qty 2

## 2012-12-25 NOTE — ED Notes (Signed)
Patient c/o mid-back spasms x 3 weeks. States recently moved and thinks she pulled something while moving furniture.
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
## 2012-12-31 ENCOUNTER — Telehealth: Payer: Self-pay | Admitting: Family Medicine

## 2013-01-25 ENCOUNTER — Emergency Department (HOSPITAL_COMMUNITY)
Admission: EM | Admit: 2013-01-25 | Discharge: 2013-01-25 | Disposition: A | Discharge status: Home / Self Care | Payer: Medicare Other | Attending: Emergency Medicine | Admitting: Emergency Medicine

## 2013-01-25 ENCOUNTER — Encounter (HOSPITAL_COMMUNITY): Payer: Self-pay | Admitting: Emergency Medicine

## 2013-01-25 DIAGNOSIS — G43909 Migraine, unspecified, not intractable, without status migrainosus: Secondary | ICD-10-CM | POA: Diagnosis not present

## 2013-01-25 DIAGNOSIS — Z880 Allergy status to penicillin: Secondary | ICD-10-CM | POA: Insufficient documentation

## 2013-01-25 DIAGNOSIS — Z91040 Latex allergy status: Secondary | ICD-10-CM | POA: Insufficient documentation

## 2013-01-25 DIAGNOSIS — J45909 Unspecified asthma, uncomplicated: Secondary | ICD-10-CM | POA: Insufficient documentation

## 2013-01-25 DIAGNOSIS — F17200 Nicotine dependence, unspecified, uncomplicated: Secondary | ICD-10-CM | POA: Diagnosis not present

## 2013-01-25 DIAGNOSIS — J029 Acute pharyngitis, unspecified: Secondary | ICD-10-CM | POA: Diagnosis not present

## 2013-01-25 DIAGNOSIS — M129 Arthropathy, unspecified: Secondary | ICD-10-CM | POA: Insufficient documentation

## 2013-01-25 DIAGNOSIS — Z87820 Personal history of traumatic brain injury: Secondary | ICD-10-CM | POA: Diagnosis not present

## 2013-01-25 DIAGNOSIS — J0110 Acute frontal sinusitis, unspecified: Secondary | ICD-10-CM | POA: Diagnosis not present

## 2013-01-25 DIAGNOSIS — Z8659 Personal history of other mental and behavioral disorders: Secondary | ICD-10-CM | POA: Insufficient documentation

## 2013-01-25 MED ORDER — AZITHROMYCIN 250 MG PO TABS: ORAL_TABLET | ORAL | Status: DC

## 2013-01-25 MED ORDER — MAGIC MOUTHWASH W/LIDOCAINE: 10.0000 mL | Freq: Four times a day (QID) | ORAL | Status: DC | PRN

## 2013-01-25 NOTE — ED Notes (Signed)
Pt with sore throat for 1 1/2 weeks, chills, low grade fever

## 2013-01-25 NOTE — ED Notes (Signed)
Pt seen and evaluated by EDPa for intial assessment. 
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
## 2013-02-04 ENCOUNTER — Encounter (HOSPITAL_COMMUNITY): Payer: Self-pay | Admitting: Emergency Medicine

## 2013-02-04 DIAGNOSIS — G43909 Migraine, unspecified, not intractable, without status migrainosus: Secondary | ICD-10-CM | POA: Insufficient documentation

## 2013-02-04 DIAGNOSIS — T7840XA Allergy, unspecified, initial encounter: Secondary | ICD-10-CM | POA: Diagnosis not present

## 2013-02-04 DIAGNOSIS — Z91040 Latex allergy status: Secondary | ICD-10-CM | POA: Diagnosis not present

## 2013-02-04 DIAGNOSIS — Z87820 Personal history of traumatic brain injury: Secondary | ICD-10-CM | POA: Diagnosis not present

## 2013-02-04 DIAGNOSIS — Z880 Allergy status to penicillin: Secondary | ICD-10-CM | POA: Diagnosis not present

## 2013-02-04 DIAGNOSIS — F329 Major depressive disorder, single episode, unspecified: Secondary | ICD-10-CM | POA: Insufficient documentation

## 2013-02-04 DIAGNOSIS — Z79899 Other long term (current) drug therapy: Secondary | ICD-10-CM | POA: Diagnosis not present

## 2013-02-04 DIAGNOSIS — M129 Arthropathy, unspecified: Secondary | ICD-10-CM | POA: Diagnosis not present

## 2013-02-04 DIAGNOSIS — IMO0002 Reserved for concepts with insufficient information to code with codable children: Secondary | ICD-10-CM | POA: Insufficient documentation

## 2013-02-04 DIAGNOSIS — F411 Generalized anxiety disorder: Secondary | ICD-10-CM | POA: Insufficient documentation

## 2013-02-04 DIAGNOSIS — Z792 Long term (current) use of antibiotics: Secondary | ICD-10-CM | POA: Diagnosis not present

## 2013-02-04 DIAGNOSIS — F3289 Other specified depressive episodes: Secondary | ICD-10-CM | POA: Insufficient documentation

## 2013-02-04 DIAGNOSIS — L299 Pruritus, unspecified: Secondary | ICD-10-CM | POA: Diagnosis not present

## 2013-02-04 DIAGNOSIS — F17200 Nicotine dependence, unspecified, uncomplicated: Secondary | ICD-10-CM | POA: Diagnosis not present

## 2013-02-04 DIAGNOSIS — J45909 Unspecified asthma, uncomplicated: Secondary | ICD-10-CM | POA: Insufficient documentation

## 2013-02-04 DIAGNOSIS — R21 Rash and other nonspecific skin eruption: Secondary | ICD-10-CM | POA: Diagnosis not present

## 2013-02-04 NOTE — ED Notes (Signed)
Patient complaining of itching all over. Denies hives, rash, or respiratory symptoms.

## 2013-02-05 ENCOUNTER — Emergency Department (HOSPITAL_COMMUNITY)
Admission: EM | Admit: 2013-02-05 | Discharge: 2013-02-05 | Disposition: A | Discharge status: Home / Self Care | Payer: Medicare Other | Attending: Emergency Medicine | Admitting: Emergency Medicine

## 2013-02-05 DIAGNOSIS — T7840XA Allergy, unspecified, initial encounter: Secondary | ICD-10-CM

## 2013-02-05 MED ORDER — PREDNISONE 50 MG PO TABS
60.0000 mg | ORAL_TABLET | Freq: Once | ORAL | Status: AC
  Administered 2013-02-05: 60 mg via ORAL
  Filled 2013-02-05 (×2): qty 1

## 2013-02-05 MED ORDER — DIPHENHYDRAMINE HCL 25 MG PO CAPS
25.0000 mg | ORAL_CAPSULE | Freq: Once | ORAL | Status: AC
  Administered 2013-02-05: 25 mg via ORAL
  Filled 2013-02-05: qty 1

## 2013-02-05 MED ORDER — DIPHENHYDRAMINE HCL 50 MG/ML IJ SOLN
25.0000 mg | Freq: Once | INTRAMUSCULAR | Status: DC
  Filled 2013-02-05: qty 1

## 2013-02-05 MED ORDER — PREDNISONE 20 MG PO TABS: 40.0000 mg | ORAL_TABLET | Freq: Every day | ORAL | Status: DC

## 2013-02-05 MED ORDER — DIPHENHYDRAMINE HCL 25 MG PO TABS: 25.0000 mg | ORAL_TABLET | Freq: Four times a day (QID) | ORAL | Status: DC | PRN

## 2013-02-05 MED ORDER — FAMOTIDINE 20 MG PO TABS: 20.0000 mg | ORAL_TABLET | Freq: Two times a day (BID) | ORAL | Status: DC
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
## 2013-05-21 ENCOUNTER — Telehealth: Payer: Self-pay | Admitting: Family Medicine

## 2013-05-21 ENCOUNTER — Other Ambulatory Visit: Payer: Self-pay | Admitting: General Practice

## 2013-05-23 NOTE — Telephone Encounter (Signed)
Needs to be seen

## 2013-05-24 NOTE — Telephone Encounter (Signed)
Patient is unable to come in due to transportation issues. She has been to the ER because it is beside of her apartment. They have given her breathing treatments but no prescription for an inhaler. I addressed her accusation that she has been calling for a week. The first message I see was from Friday 05/21/13.  Explained that she has not been seen for asthma within the past year. Last visits were 9 months ago and were for muscle pain. We will need to evaluate her asthma in order to change her prescription.  If she goes back to the ER she will need to request a prescription for an inhaler. Patient stated understanding.

## 2013-05-25 ENCOUNTER — Other Ambulatory Visit: Payer: Self-pay | Admitting: General Practice

## 2013-05-25 DIAGNOSIS — J45909 Unspecified asthma, uncomplicated: Secondary | ICD-10-CM

## 2013-05-25 MED ORDER — ALBUTEROL SULFATE HFA 108 (90 BASE) MCG/ACT IN AERS: 2.0000 | INHALATION_SPRAY | Freq: Four times a day (QID) | RESPIRATORY_TRACT | Status: DC | PRN

## 2013-05-25 NOTE — Telephone Encounter (Signed)
Script sent to pharmacy.

## 2013-05-27 ENCOUNTER — Encounter: Payer: Self-pay | Admitting: Nurse Practitioner

## 2013-05-27 ENCOUNTER — Ambulatory Visit (INDEPENDENT_AMBULATORY_CARE_PROVIDER_SITE_OTHER): Payer: Medicare Other | Admitting: Nurse Practitioner

## 2013-05-27 VITALS — BP 112/79 | HR 73 | Temp 97.9°F | Ht 69.0 in | Wt 201.0 lb

## 2013-05-27 DIAGNOSIS — R5383 Other fatigue: Secondary | ICD-10-CM | POA: Diagnosis not present

## 2013-05-27 DIAGNOSIS — R5381 Other malaise: Secondary | ICD-10-CM

## 2013-05-27 LAB — GLUCOSE, POCT (MANUAL RESULT ENTRY): POC Glucose: 101 mg/dl — AB (ref 70–99)

## 2013-05-27 NOTE — Progress Notes (Signed)
   Subjective:    Patient ID: Shelby [name], female    DOB: 06-Aug-1975, 38 y.o.   MRN: [id number]  HPI Patient had most of her teeth pulled 2 months ago and cannot eat solid foods because she has no teeth to chew. SHe says that she feels that her blood sugar is dropping because she can't eat- She feels weak- Her mom checked her blood sugar yesterday was 134 and this morning it was 83. All she has eaten today is a couple of spoons of peanut butter.    Review of Systems  Constitutional: Positive for appetite change. Negative for fever and chills.  Respiratory: Negative.   Cardiovascular: Negative.   Gastrointestinal: Negative.   Neurological: Positive for dizziness and light-headedness. Negative for headaches.  Psychiatric/Behavioral: Negative.        Objective:   Physical Exam  Constitutional: She is oriented to person, place, and time. She appears well-developed and well-nourished.  Cardiovascular: Normal rate and normal heart sounds.   Neurological: She is alert and oriented to person, place, and time.  Skin: Skin is warm and dry.  Psychiatric: She has a normal mood and affect. Her behavior is normal. Judgment and thought content normal.   BP 112/79  Pulse 73  Temp(Src) 97.9 F (36.6 C) (Oral)  Ht $R'5\' 9"'gE$  (1.753 m)  Wt 201 lb (91.173 kg)  BMI 29.67 kg/m2   Results for orders placed in visit on 05/27/13  GLUCOSE, POCT (MANUAL RESULT ENTRY)      Result Value Ref Range   POC Glucose 101 (*) 70 - 99 mg/dl         Assessment & Plan:   1. Other malaise and fatigue    Orders Placed This Encounter  Procedures  . BMP8+EGFR  . Thyroid Panel With TSH  . POCT glucose (manual entry)   Try to eat every 4 hours RTO prn  [name] Done, FNP

## 2013-05-27 NOTE — Patient Instructions (Signed)

## 2013-05-28 LAB — BMP8+EGFR
BUN/Creatinine Ratio: 11 (ref 8–20)
BUN: 9 mg/dL (ref 6–20)
CO2: 25 mmol/L (ref 18–29)
Calcium: 9.1 mg/dL (ref 8.7–10.2)
Chloride: 103 mmol/L (ref 97–108)
Creatinine, Ser: 0.79 mg/dL (ref 0.57–1.00)
GFR calc Af Amer: 111 mL/min/{1.73_m2} (ref >59)
GFR, EST NON AFRICAN AMERICAN: 96 mL/min/{1.73_m2} (ref >59)
GLUCOSE: 86 mg/dL (ref 65–99)
POTASSIUM: 4.6 mmol/L (ref 3.5–5.2)
SODIUM: 142 mmol/L (ref 134–144)

## 2013-05-28 LAB — THYROID PANEL WITH TSH
FREE THYROXINE INDEX: 1.9 (ref 1.2–4.9)
T3 Uptake Ratio: 27 % (ref 24–39)
T4, Total: 7.1 ug/dL (ref 4.5–12.0)
TSH: 1.37 u[IU]/mL (ref 0.450–4.500)

## 2013-05-31 ENCOUNTER — Telehealth: Payer: Self-pay | Admitting: *Deleted

## 2013-05-31 NOTE — Telephone Encounter (Signed)
Aware. 

## 2013-05-31 NOTE — Telephone Encounter (Signed)
Message copied by Shelbie Ammons on Mon May 31, 2013  9:44 AM ------      Message from: Chevis Pretty      Created: Fri May 28, 2013  1:44 PM       All labs are normal ------

## 2013-06-02 ENCOUNTER — Telehealth: Payer: Self-pay | Admitting: *Deleted

## 2013-06-02 ENCOUNTER — Other Ambulatory Visit: Payer: Self-pay | Admitting: General Practice

## 2013-06-02 MED ORDER — ALBUTEROL SULFATE HFA 108 (90 BASE) MCG/ACT IN AERS: 2.0000 | INHALATION_SPRAY | Freq: Four times a day (QID) | RESPIRATORY_TRACT | Status: DC | PRN

## 2013-06-02 NOTE — Telephone Encounter (Signed)
`  Ins co. Denied proventil, the only inhalers they will cover are ventolin (which pt.has tried) and ProAir that I can't see that the pt has tried.  I don't know if that will work, if not I will try top get an exception for the proventil, please advise.  Thanks!

## 2013-06-02 NOTE — Telephone Encounter (Signed)
proair script sent to pharmacy

## 2013-06-03 NOTE — Telephone Encounter (Signed)
proair prescribed by Mae.

## 2013-06-08 ENCOUNTER — Telehealth: Payer: Self-pay | Admitting: Family Medicine

## 2013-06-08 ENCOUNTER — Encounter: Payer: Medicare Other | Admitting: Family Medicine

## 2013-06-08 NOTE — Telephone Encounter (Signed)
appt made
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
## 2013-06-09 NOTE — Progress Notes (Signed)
Patient ID: Shelby [name], female   DOB: Jul 02, 1975, 38 y.o.   MRN: [id number] Not seen.  Shelby [name], M.D.  This encounter was created in error - please disregard.

## 2013-08-17 ENCOUNTER — Encounter: Payer: Self-pay | Admitting: Neurology

## 2013-08-17 ENCOUNTER — Ambulatory Visit (INDEPENDENT_AMBULATORY_CARE_PROVIDER_SITE_OTHER): Payer: Medicare Other | Admitting: Neurology

## 2013-08-17 ENCOUNTER — Telehealth: Payer: Self-pay | Admitting: Neurology

## 2013-08-17 ENCOUNTER — Encounter (INDEPENDENT_AMBULATORY_CARE_PROVIDER_SITE_OTHER): Payer: Self-pay

## 2013-08-17 VITALS — Ht 68.0 in | Wt 200.0 lb

## 2013-08-17 DIAGNOSIS — G43709 Chronic migraine without aura, not intractable, without status migrainosus: Secondary | ICD-10-CM

## 2013-08-17 DIAGNOSIS — S069X0A Unspecified intracranial injury without loss of consciousness, initial encounter: Secondary | ICD-10-CM

## 2013-08-17 MED ORDER — SUMATRIPTAN SUCCINATE 100 MG PO TABS: 100.0000 mg | ORAL_TABLET | ORAL | Status: DC | PRN

## 2013-08-17 MED ORDER — LAMOTRIGINE 100 MG PO TABS: 100.0000 mg | ORAL_TABLET | Freq: Every day | ORAL | Status: DC

## 2013-08-17 MED ORDER — LAMOTRIGINE 35 X 25 MG PO KIT: PACK | ORAL | Status: DC

## 2013-08-17 MED ORDER — LAMOTRIGINE ER 100 MG PO TB24: 100.0000 mg | ORAL_TABLET | Freq: Every day | ORAL | Status: DC

## 2013-08-17 NOTE — Telephone Encounter (Signed)
Patient stated insurance will not cover LamoTRIgine (LAMICTAL XR) 100 MG TB24.  Please call and advise.  Thanks
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
## 2013-08-17 NOTE — Telephone Encounter (Signed)
I called the pharmacy.  They state the ins will only cover regular Lamotrigine, not XR form.  They are requesting a drug change.  Okay to change Rx?  Please advise.  Thank you.

## 2013-08-17 NOTE — Telephone Encounter (Signed)
Rx has been sent  

## 2013-08-17 NOTE — Telephone Encounter (Signed)
Yes, Ok to change to regular Lamotrigine

## 2013-08-20 ENCOUNTER — Telehealth: Payer: Self-pay | Admitting: Neurology

## 2013-08-20 DIAGNOSIS — G43909 Migraine, unspecified, not intractable, without status migrainosus: Secondary | ICD-10-CM

## 2013-08-20 DIAGNOSIS — R413 Other amnesia: Secondary | ICD-10-CM

## 2013-08-20 NOTE — Telephone Encounter (Signed)
Patient's mother called and stated patient would like to proceed with Psychiatrist referral.  Please call patient to schedule appointment.  Thanks

## 2013-08-20 NOTE — Telephone Encounter (Signed)
Pt's mother calling stating that pt would like to be referred to a Psychiatrist, pt was last seen on 08/17/13. I did not see in Dr. Rhea Belton notes mentioning a referral to a Psychiatrist. Please advise

## 2013-08-20 NOTE — Telephone Encounter (Signed)
Let her know that I have referred her to psychiatrist

## 2013-08-23 NOTE — Telephone Encounter (Signed)
Called pt and spoke with pt's mother Rosaria Ferries informing her per Dr. Krista Blue that the doctor has referred the pt to a psychiatrist. I advised the mother that if the pt has any other problems, questions or concerns to call the office. Mother verbalized understanding.

## 2013-08-26 ENCOUNTER — Telehealth: Payer: Self-pay | Admitting: Neurology

## 2013-08-26 NOTE — Telephone Encounter (Signed)
Patient's mom calling to inform Psychiatry referral contact her with appointment time.  Patient's cell phone is being repaired.  Please call mom on home # @ [phone] or cell # [phone].  Thanks

## 2013-08-27 NOTE — Telephone Encounter (Signed)
Called pt and pt stated that she has no way of coming to get the paperwork and that she would like for it to be mailed out to her. I informed the pt that I would be putting it in the mail today. I advised the pt that if she has any other problems, questions or concerns to call the office. Pt verbalized understanding.

## 2013-08-27 NOTE — Telephone Encounter (Signed)
Called patients mother left message asking her to call me back.  [facility] Patient can bring order and notes Monday - Friday. 8-3 . Patient can walk in any day Monday - Friday patient does not need appt. [facility] [address] [place]. [place] phone # [phone].  I will leave order and notes at the front desk for pick up.

## 2013-08-27 NOTE — Telephone Encounter (Signed)
Patient called back. Patient is going to see Dr.Parrish Caprice Beaver order and notes have been faxed.  Patient is not going to go per request anything for [facility].

## 2013-08-27 NOTE — Telephone Encounter (Signed)
Patient is returning call-has questions about previous note.

## 2013-09-01 ENCOUNTER — Other Ambulatory Visit: Payer: Self-pay | Admitting: Neurology

## 2013-09-06 NOTE — Telephone Encounter (Signed)
Dr.Parish Caprice Beaver does not take patient's insurance any More. I called and spoke to patient 's mother and Told her. I called [facility] [phone] and they do accept patient's insurance but they require $150.00 deposit. Patient's mother states they don't no if they can do that. Patient's mother states she will call around to some mother places and if she needs's me to fax anything she will call back. Patients mother understood everything.

## 2013-09-13 ENCOUNTER — Ambulatory Visit: Payer: Medicare Other | Admitting: Family Medicine

## 2013-09-15 DIAGNOSIS — R300 Dysuria: Secondary | ICD-10-CM | POA: Diagnosis not present

## 2013-09-15 DIAGNOSIS — N949 Unspecified condition associated with female genital organs and menstrual cycle: Secondary | ICD-10-CM | POA: Diagnosis not present

## 2013-09-15 DIAGNOSIS — N9489 Other specified conditions associated with female genital organs and menstrual cycle: Secondary | ICD-10-CM | POA: Diagnosis not present

## 2013-10-05 ENCOUNTER — Telehealth: Payer: Self-pay | Admitting: Nurse Practitioner

## 2013-10-05 NOTE — Telephone Encounter (Signed)
Patient NTBS because i do  Not know what she is talking about so I can get to the right place.

## 2013-10-06 ENCOUNTER — Encounter: Payer: Self-pay | Admitting: Nurse Practitioner

## 2013-10-06 ENCOUNTER — Ambulatory Visit (INDEPENDENT_AMBULATORY_CARE_PROVIDER_SITE_OTHER): Payer: Medicare Other | Admitting: Nurse Practitioner

## 2013-10-06 VITALS — BP 118/73 | HR 58 | Temp 97.9°F | Ht 68.0 in | Wt 201.0 lb

## 2013-10-06 DIAGNOSIS — M25559 Pain in unspecified hip: Secondary | ICD-10-CM | POA: Diagnosis not present

## 2013-10-06 NOTE — Patient Instructions (Signed)
Pelvic Pain Female pelvic pain can be caused by many different things and start from a variety of places. Pelvic pain refers to pain that is located in the lower half of the abdomen and between your hips. The pain may occur over a short period of time (acute) or may be reoccurring (chronic). The cause of pelvic pain may be related to disorders affecting the female reproductive organs (gynecologic), but it may also be related to the bladder, kidney stones, an intestinal complication, or muscle or skeletal problems. Getting help right away for pelvic pain is important, especially if there has been severe, sharp, or a sudden onset of unusual pain. It is also important to get help right away because some types of pelvic pain can be life threatening.  CAUSES  Below are only some of the causes of pelvic pain. The causes of pelvic pain can be in one of several categories.   Gynecologic.  Pelvic inflammatory disease.  Sexually transmitted infection.  Ovarian cyst or a twisted ovarian ligament (ovarian torsion).  Uterine lining that grows outside the uterus (endometriosis).  Fibroids, cysts, or tumors.  Ovulation.  Pregnancy.  Pregnancy that occurs outside the uterus (ectopic pregnancy).  Miscarriage.  Labor.  Abruption of the placenta or ruptured uterus.  Infection.  Uterine infection (endometritis).  Bladder infection.  Diverticulitis.  Miscarriage related to a uterine infection (septic abortion).  Bladder.  Inflammation of the bladder (cystitis).  Kidney stone(s).  Gastrointestinal.  Constipation.  Diverticulitis.  Neurologic.  Trauma.  Feeling pelvic pain because of mental or emotional causes (psychosomatic).  Cancers of the bowel or pelvis. EVALUATION  Your caregiver will want to take a careful history of your concerns. This includes recent changes in your health, a careful gynecologic history of your periods (menses), and a sexual history. Obtaining your family  history and medical history is also important. Your caregiver may suggest a pelvic exam. A pelvic exam will help identify the location and severity of the pain. It also helps in the evaluation of which organ system may be involved. In order to identify the cause of the pelvic pain and be properly treated, your caregiver may order tests. These tests may include:   A pregnancy test.  Pelvic ultrasonography.  An X-ray exam of the abdomen.  A urinalysis or evaluation of vaginal discharge.  Blood tests. HOME CARE INSTRUCTIONS   Only take over-the-counter or prescription medicines for pain, discomfort, or fever as directed by your caregiver.   Rest as directed by your caregiver.   Eat a balanced diet.   Drink enough fluids to make your urine clear or pale yellow, or as directed.   Avoid sexual intercourse if it causes pain.   Apply warm or cold compresses to the lower abdomen depending on which one helps the pain.   Avoid stressful situations.   Keep a journal of your pelvic pain. Write down when it started, where the pain is located, and if there are things that seem to be associated with the pain, such as food or your menstrual cycle.  Follow up with your caregiver as directed.  SEEK MEDICAL CARE IF:  Your medicine does not help your pain.  You have abnormal vaginal discharge. SEEK IMMEDIATE MEDICAL CARE IF:   You have heavy bleeding from the vagina.   Your pelvic pain increases.   You feel light-headed or faint.   You have chills.   You have pain with urination or blood in your urine.   You have uncontrolled diarrhea   or vomiting.   You have a fever or persistent symptoms for more than 3 days.  You have a fever and your symptoms suddenly get worse.   You are being physically or sexually abused.  MAKE SURE YOU:  Understand these instructions.  Will watch your condition.  Will get help if you are not doing well or get worse. Document Released:  12/26/2003 Document Revised: 06/14/2013 Document Reviewed: 05/20/2011 ExitCare Patient Information 2015 [employer]. This information is not intended to replace advice given to you by your health care provider. Make sure you discuss any questions you have with your health care provider.  

## 2013-10-06 NOTE — Telephone Encounter (Signed)
Appt made with MMM for today. Pt aware.

## 2013-10-06 NOTE — Progress Notes (Signed)
   Subjective:    Patient ID: Shelby [name], female    DOB: October 18, 1975, 38 y.o.   MRN: [id number]  HPI Patient in today c/o abdominal pain- Has had for several years. Has seen GYN and had laproscopy and removal of scar tissue- That did not help- Was referred to surgeon and had surgery but she can't remember what the surgeon did. Patient said from what she can remembers the surgeon told her that she would have to have hysterectomy  to relieve pain. Does not want to go to GYN she went to in the past.  Review of Systems  Constitutional: Negative.   HENT: Negative.   Respiratory: Negative.   Cardiovascular: Negative.   Genitourinary: Negative.   Neurological: Negative.   Psychiatric/Behavioral: Negative.   All other systems reviewed and are negative.      Objective:   Physical Exam  Constitutional: She is oriented to person, place, and time. She appears well-developed and well-nourished.  Cardiovascular: Normal rate, regular rhythm and normal heart sounds.   Pulmonary/Chest: Effort normal and breath sounds normal.  Abdominal: Soft. Bowel sounds are normal. There is tenderness (bil suprspbic area on light papation).  Genitourinary:  No pelvic exam performed- deferred to GYN  Neurological: She is alert and oriented to person, place, and time.  Skin: Skin is warm.  Psychiatric: She has a normal mood and affect. Her behavior is normal. Judgment and thought content normal.   BP 118/73  Pulse 58  Temp(Src) 97.9 F (36.6 C) (Oral)  Ht 5\' 8"  (1.727 m)  Wt 201 lb (91.173 kg)  BMI 30.57 kg/m2        Assessment & Plan:   1. Pain in joint, pelvic region and thigh, unspecified laterality    Orders Placed This Encounter  Procedures  . Ambulatory referral to Gynecology    Referral Priority:  Routine    Referral Type:  Consultation    Referral Reason:  Specialty Services Required    Requested Specialty:  Gynecology    Number of Visits Requested:  1   RTO prn  Mary-Margaret  Hassell Done, FNP

## 2013-10-25 ENCOUNTER — Telehealth: Payer: Self-pay | Admitting: Neurology

## 2013-10-25 NOTE — Telephone Encounter (Signed)
Patient's mother called and would like Hinton Dyer to call her back in regards to the referral.  She can be reached at [phone].

## 2013-10-26 NOTE — Telephone Encounter (Signed)
Spoke with patient's mother and she wanted me to fax notes to Dr.Chizhikov [phone].I faxed  To Doctors office.

## 2013-11-08 ENCOUNTER — Encounter: Payer: Self-pay | Admitting: Obstetrics & Gynecology

## 2013-11-08 ENCOUNTER — Ambulatory Visit (INDEPENDENT_AMBULATORY_CARE_PROVIDER_SITE_OTHER): Payer: Medicare Other | Admitting: Obstetrics & Gynecology

## 2013-11-08 VITALS — BP 126/82 | HR 76 | Resp 16 | Ht 69.0 in | Wt 204.0 lb

## 2013-11-08 DIAGNOSIS — N806 Endometriosis in cutaneous scar: Secondary | ICD-10-CM | POA: Diagnosis not present

## 2013-11-08 MED ORDER — MEDROXYPROGESTERONE ACETATE 5 MG PO TABS: 5.0000 mg | ORAL_TABLET | Freq: Every day | ORAL | Status: DC

## 2013-11-08 MED ORDER — LEUPROLIDE ACETATE (3 MONTH) 11.25 MG IM KIT
11.2500 mg | PACK | Freq: Once | INTRAMUSCULAR | Status: AC
  Administered 2013-11-15: 11.25 mg via INTRAMUSCULAR

## 2013-11-08 NOTE — Patient Instructions (Signed)
Endometriosis Endometriosis is a condition in which the tissue that lines the uterus (endometrium) grows outside of its normal location. The tissue may grow in many locations close to the uterus, but it commonly grows on the ovaries, fallopian tubes, vagina, or bowel. Because the uterus expels, or sheds, its lining every menstrual cycle, there is bleeding wherever the endometrial tissue is located. This can cause pain because blood is irritating to tissues not normally exposed to it.  CAUSES  The cause of endometriosis is not known.  SIGNS AND SYMPTOMS  Often, there are no symptoms. When symptoms are present, they can vary with the location of the displaced tissue. Various symptoms can occur at different times. Although symptoms occur mainly during a woman's menstrual period, they can also occur midcycle and usually stop with menopause. Some people may go months with no symptoms at all. Symptoms may include:   Back or abdominal pain.   Heavier bleeding during periods.   Pain during intercourse.   Painful bowel movements.   Infertility. DIAGNOSIS  Your health care provider will do a physical exam and ask about your symptoms. Various tests may be done, such as:   Blood tests and urine tests. These are done to help rule out other problems.   Ultrasound. This test is done to look for abnormal tissue.   An X-ray of the lower bowel (barium enema).  Laparoscopy. In this procedure, a thin, lighted tube with a tiny camera on the end (laparoscope) is inserted into your abdomen. This helps your health care provider look for abnormal tissue to confirm the diagnosis. The health care provider may also remove a small piece of tissue (biopsy) from any abnormal tissue found. This tissue sample can then be sent to a lab so it can be looked at under a microscope. TREATMENT  Treatment will vary and may include:   Medicines to relieve pain. Nonsteroidal anti-inflammatory drugs (NSAIDs) are a type of  pain medicine that can help to relieve the pain caused by endometriosis.  Hormonal therapy. When using hormonal therapy, periods are eliminated. This eliminates the monthly exposure to blood by the displaced endometrial tissue.   Surgery. Surgery may sometimes be done to remove the abnormal endometrial tissue. In severe cases, surgery may be done to remove the fallopian tubes, uterus, and ovaries (hysterectomy). HOME CARE INSTRUCTIONS   Take all medicines as directed by your health care provider. Do not take aspirin because it may increase bleeding when you are not on hormonal therapy.   Avoid activities that produce pain, including sexual activity. SEEK MEDICAL CARE IF:  You have pelvic pain before, after, or during your periods.  You have pelvic pain between periods that gets worse during your period.  You have pelvic pain during or after sex.  You have pelvic pain with bowel movements or urination, especially during your period.  You have problems getting pregnant.  You have a fever. SEEK IMMEDIATE MEDICAL CARE IF:   Your pain is severe and is not responding to pain medicine.   You have severe nausea and vomiting, or you cannot keep foods down.   You have pain that is limited to the right lower part of your abdomen.   You have swelling or increasing pain in your abdomen.   You see blood in your stool.  MAKE SURE YOU:   Understand these instructions.  Will watch your condition.  Will get help right away if you are not doing well or get worse. Document Released: 01/26/2000 Document  Revised: 06/14/2013 Document Reviewed: 09/25/2012 ExitCare Patient Information 2015 [employer]. This information is not intended to replace advice given to you by your health care provider. Make sure you discuss any questions you have with your health care provider.  
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
## 2013-11-15 ENCOUNTER — Ambulatory Visit (INDEPENDENT_AMBULATORY_CARE_PROVIDER_SITE_OTHER): Payer: Medicare Other | Admitting: *Deleted

## 2013-11-15 ENCOUNTER — Telehealth: Payer: Self-pay | Admitting: Neurology

## 2013-11-15 DIAGNOSIS — N806 Endometriosis in cutaneous scar: Secondary | ICD-10-CM | POA: Diagnosis not present

## 2013-11-15 DIAGNOSIS — S069X1D Unspecified intracranial injury with loss of consciousness of 30 minutes or less, subsequent encounter: Secondary | ICD-10-CM

## 2013-11-15 DIAGNOSIS — R413 Other amnesia: Secondary | ICD-10-CM

## 2013-11-15 NOTE — Telephone Encounter (Signed)
Called and and left message for patients mother Rosaria Ferries  To call me back.  Is she needed a referral done ?

## 2013-11-15 NOTE — Telephone Encounter (Signed)
Patient's mother calling to state that she got more information from patient's doctor in [place] who said that patient would benefit from seeing a neuropsychiatrist. Please return call and advise.

## 2013-11-23 NOTE — Telephone Encounter (Signed)
Called  And spoke to patient's mother  Per Psychiatrist Dr. Crisoforo Oxford he is in [place] patients mother he would not be able to see her because she needed to see Neuro psychiatrist . Please ask Dr.Yan if she can do referral for Dr.Zelson.

## 2013-11-23 NOTE — Telephone Encounter (Signed)
Patient's mother calling to request a referral for neuro psychiatrist, please advise

## 2013-11-23 NOTE — Telephone Encounter (Signed)
Called patient's

## 2013-11-23 NOTE — Telephone Encounter (Signed)
Shelby [name], he slept patient and her mother know,  I have referred her for a neuropsychology evaluation

## 2013-11-23 NOTE — Telephone Encounter (Signed)
Called patient's mother to let her know were are getting her scheduled with Dr.Zelson.

## 2013-11-24 NOTE — Telephone Encounter (Signed)
Wants a referral for Neuropsychiatrist.

## 2013-11-24 NOTE — Telephone Encounter (Signed)
Patient's mother calling to speak with Hinton Dyer regarding the Dr. Valentina Shaggy referral, states that Dr. Valentina Shaggy is not who patient needs to see, they need to see a neuropsychiatrist. Please return call and advise.

## 2013-11-24 NOTE — Telephone Encounter (Signed)
Patient's mother's cell does not have VM-- [phone]. If a message needs to be left, please call the home number at [phone].

## 2013-11-25 ENCOUNTER — Telehealth: Payer: Self-pay | Admitting: *Deleted

## 2013-11-25 NOTE — Telephone Encounter (Signed)
Pt called clinic demanding to speak with a nurse stating she cannot come to the ER in [place] and the ER in [place] does not help her.  Spoke with patient and encouraged patient to come to [facility] if in as much pain as she states. Pt verbalizes understanding.

## 2013-11-26 NOTE — Addendum Note (Signed)
Addended by: Marcial Pacas on: 11/26/2013 11:45 AM   Modules accepted: Orders

## 2013-11-26 NOTE — Telephone Encounter (Signed)
I have called her mother that I have send refer to   [facility]  [address] [place], [place] [address] [phone]

## 2013-11-29 NOTE — Telephone Encounter (Signed)
Faxed

## 2013-11-30 ENCOUNTER — Telehealth: Payer: Self-pay | Admitting: *Deleted

## 2013-11-30 NOTE — Telephone Encounter (Signed)
Ok to refer her to the physicians that she preferrs

## 2013-11-30 NOTE — Telephone Encounter (Signed)
Patients mother called and did not want to go to office where patient was referred to by Dr. Krista Blue.  Patient wants to see either Dr. Raynelle Jan at [phone] or Dr. Real Cons at [phone].

## 2013-11-30 NOTE — Telephone Encounter (Signed)
Hinton Dyer C working I have spoke with patient's mother will call both office to see who has first opening.

## 2013-12-01 NOTE — Telephone Encounter (Signed)
Called and spoke to patient's mother and told her Dr. Real Cons has moved out of town. Dr. Raynelle Jan I have left them a message to call me back to set up appt. For patient and fax number to send order and notes. Patient's mother is aware and she understood.

## 2013-12-01 NOTE — Telephone Encounter (Signed)
Trying to call office where patient mother wants her to go no success yet . I will call Patient's mother and tell her I am still trying to get threw to the office.

## 2013-12-02 NOTE — Telephone Encounter (Signed)
Shelby [name] called me back from mental Health. [place]. Called him back and left a message. I need a fax number to send referral to get patient scheduled.

## 2013-12-03 NOTE — Telephone Encounter (Signed)
Shelby [name] Called me back and at this time they are only seeing veterans. Called Shelby  [name] and left her message stating this and to call me back and see if there is somewhere she wants try. Patient's [name] only wants her to go to certain places.

## 2013-12-06 NOTE — Telephone Encounter (Signed)
Called and spoke to patient's mother gave her the name's of  Dr.John Thurmond Butts [phone] and Dr.Adam McDermott [phone]. Patient's mother will look to she if she likes the providers for her daughter and give me a call back.

## 2013-12-09 ENCOUNTER — Encounter (HOSPITAL_COMMUNITY): Payer: Self-pay | Admitting: Emergency Medicine

## 2013-12-09 ENCOUNTER — Emergency Department (HOSPITAL_COMMUNITY): Payer: Medicare Other

## 2013-12-09 ENCOUNTER — Emergency Department (HOSPITAL_COMMUNITY)
Admission: EM | Admit: 2013-12-09 | Discharge: 2013-12-09 | Disposition: A | Discharge status: Home / Self Care | Payer: Medicare Other | Attending: Emergency Medicine | Admitting: Emergency Medicine

## 2013-12-09 DIAGNOSIS — G43909 Migraine, unspecified, not intractable, without status migrainosus: Secondary | ICD-10-CM | POA: Diagnosis not present

## 2013-12-09 DIAGNOSIS — Z91040 Latex allergy status: Secondary | ICD-10-CM | POA: Diagnosis not present

## 2013-12-09 DIAGNOSIS — Z720 Tobacco use: Secondary | ICD-10-CM | POA: Diagnosis not present

## 2013-12-09 DIAGNOSIS — Z8659 Personal history of other mental and behavioral disorders: Secondary | ICD-10-CM | POA: Diagnosis not present

## 2013-12-09 DIAGNOSIS — Z7952 Long term (current) use of systemic steroids: Secondary | ICD-10-CM | POA: Diagnosis not present

## 2013-12-09 DIAGNOSIS — Z87820 Personal history of traumatic brain injury: Secondary | ICD-10-CM | POA: Insufficient documentation

## 2013-12-09 DIAGNOSIS — J40 Bronchitis, not specified as acute or chronic: Secondary | ICD-10-CM | POA: Diagnosis not present

## 2013-12-09 DIAGNOSIS — F17210 Nicotine dependence, cigarettes, uncomplicated: Secondary | ICD-10-CM | POA: Diagnosis not present

## 2013-12-09 DIAGNOSIS — Z8739 Personal history of other diseases of the musculoskeletal system and connective tissue: Secondary | ICD-10-CM | POA: Diagnosis not present

## 2013-12-09 DIAGNOSIS — Z79899 Other long term (current) drug therapy: Secondary | ICD-10-CM | POA: Diagnosis not present

## 2013-12-09 DIAGNOSIS — R05 Cough: Secondary | ICD-10-CM

## 2013-12-09 DIAGNOSIS — Z880 Allergy status to penicillin: Secondary | ICD-10-CM | POA: Insufficient documentation

## 2013-12-09 DIAGNOSIS — J45901 Unspecified asthma with (acute) exacerbation: Secondary | ICD-10-CM | POA: Insufficient documentation

## 2013-12-09 DIAGNOSIS — R059 Cough, unspecified: Secondary | ICD-10-CM

## 2013-12-09 MED ORDER — IPRATROPIUM-ALBUTEROL 0.5-2.5 (3) MG/3ML IN SOLN
3.0000 mL | Freq: Once | RESPIRATORY_TRACT | Status: AC
  Administered 2013-12-09: 3 mL via RESPIRATORY_TRACT
  Filled 2013-12-09: qty 3

## 2013-12-09 MED ORDER — IPRATROPIUM BROMIDE 0.02 % IN SOLN: 0.5000 mg | Freq: Once | RESPIRATORY_TRACT | Status: DC

## 2013-12-09 MED ORDER — AZITHROMYCIN 250 MG PO TABS: ORAL_TABLET | ORAL | Status: DC

## 2013-12-09 MED ORDER — ALBUTEROL SULFATE (2.5 MG/3ML) 0.083% IN NEBU: 5.0000 mg | INHALATION_SOLUTION | Freq: Once | RESPIRATORY_TRACT | Status: DC

## 2013-12-09 MED ORDER — ALBUTEROL SULFATE (2.5 MG/3ML) 0.083% IN NEBU
2.5000 mg | INHALATION_SOLUTION | Freq: Once | RESPIRATORY_TRACT | Status: AC
  Administered 2013-12-09: 2.5 mg via RESPIRATORY_TRACT
  Filled 2013-12-09: qty 3

## 2013-12-09 MED ORDER — PREDNISONE 20 MG PO TABS
ORAL_TABLET | ORAL | Status: DC
Start: 2013-12-09 — End: 2014-02-24

## 2013-12-09 MED ORDER — PREDNISONE 20 MG PO TABS
40.0000 mg | ORAL_TABLET | Freq: Once | ORAL | Status: AC
  Administered 2013-12-09: 40 mg via ORAL
  Filled 2013-12-09: qty 2

## 2013-12-09 NOTE — ED Provider Notes (Signed)
CSN: [id number]     Arrival date & time 12/09/13  1217 History   First MD Initiated Contact with Patient 12/09/13 1312     Chief Complaint  Patient presents with  . Cough     (Consider location/radiation/quality/duration/timing/severity/associated sxs/prior Treatment) HPI Mrs. Shelby [name] is a 38 year old female past medical history of asthma who presents to the ER today with wheezing and cough. Patient states she's had upper respiratory illness symptoms such as nasal congestion, sore throat, cough for approximately 2 weeks, and over the past week her sore throat and nasal congestion have improved, however her cough has worsened. Patient denies fever, chest pain, dizziness, palpitations, weakness, abdominal pain, nausea, vomiting, dysuria. Patient states she has had similar episodes of bronchitis in the past, and these symptoms she is having today feel identical to her past exacerbations of her asthma and she gets a cold.  Past Medical History  Diagnosis Date  . Asthma   . Depression   . Anxiety   . Traumatic brain injury 2001    [facility]-M[facility]  . Migraines   . Bad memory     from [place] brain injury 2001  . Arthritis    Past Surgical History  Procedure Laterality Date  . Cesarean section      x2  . Cyst remove      x3 from wrist-ganglion  . Uterine ablation    . Tubal ligation    . Umbilical hernia repair  09/20/2011    Procedure: HERNIA REPAIR UMBILICAL ADULT;  Surgeon: Jamesetta So, MD;  Location: [facility];  Service: General;  Laterality: N/A;  . Incisional hernia repair  12/27/2011    Procedure: HERNIA REPAIR INCISIONAL;  Surgeon: Jamesetta So, MD;  Location: [facility];  Service: General;  Laterality: N/A;  . Wound debridement  12/27/2011    Procedure: DEBRIDEMENT ABDOMINAL WOUND;  Surgeon: Jamesetta So, MD;  Location: [facility];  Service: General;  Laterality: N/A;  . Insertion of mesh  12/27/2011    Procedure: INSERTION OF MESH;  Surgeon: Jamesetta So, MD;  Location: [facility];  Service: General;  Laterality: N/A;   Family History  Problem Relation Age of Onset  . Diabetes Father   . Colon cancer Paternal Grandfather    History  Substance Use Topics  . Smoking status: Current Every Day Smoker -- 1.00 packs/day for 10 years    Types: Cigarettes  . Smokeless tobacco: Never Used  . Alcohol Use: No   OB History   Grav Para Term Preterm Abortions TAB SAB Ect Mult Living   2         2     Review of Systems  Constitutional: Negative for fever.  HENT: Negative for congestion, sore throat and trouble swallowing.   Eyes: Negative for visual disturbance.  Respiratory: Positive for cough, chest tightness and shortness of breath.   Cardiovascular: Negative for chest pain.  Gastrointestinal: Negative for nausea, vomiting and abdominal pain.  Genitourinary: Negative for dysuria.  Musculoskeletal: Negative for neck pain.  Skin: Negative for rash.  Neurological: Negative for dizziness, weakness and numbness.  Psychiatric/Behavioral: Negative.       Allergies  Penicillins and Latex  Home Medications   Prior to Admission medications   Medication Sig Start Date End Date Taking? Authorizing Provider  ibuprofen (ADVIL,MOTRIN) 200 MG tablet Take 800 mg by mouth every 8 (eight) hours as needed for headache.   Yes Historical Provider, MD  medroxyPROGESTERone (PROVERA) 5 MG tablet Take 1  tablet (5 mg total) by mouth daily. 11/08/13  Yes Woodroe Mode, MD  Phenyleph-Diphenhyd-DM-APAP [facility] SEVERE COLD & COUGH PO) Take 15 mLs by mouth 2 (two) times daily.   Yes Historical Provider, MD  albuterol (PROAIR HFA) 108 (90 BASE) MCG/ACT inhaler Inhale 2 puffs into the lungs every 6 (six) hours as needed for wheezing or shortness of breath. 06/02/13   Erby Pian, FNP  azithromycin (ZITHROMAX Z-PAK) 250 MG tablet 2 po day one, then 1 daily x 4 days 12/09/13   Carrie Mew, PA-C  predniSONE (DELTASONE) 20 MG tablet 2 tabs po daily x 4  days 12/09/13   Carrie Mew, PA-C  SUMAtriptan (IMITREX) 100 MG tablet Take 1 tablet (100 mg total) by mouth every 2 (two) hours as needed for migraine or headache. May repeat in 2 hours if headache persists or recurs. 08/17/13   Marcial Pacas, MD   BP 120/83  Pulse 85  Temp(Src) 98.6 F (37 C) (Oral)  Resp 16  Ht 5\' 9"  (1.753 m)  Wt 208 lb (94.348 kg)  BMI 30.70 kg/m2  SpO2 96%  LMP 11/18/2013 Physical Exam  Constitutional: Vital signs are normal. She appears well-developed and well-nourished. She is active.  Non-toxic appearance. No distress.  HENT:  Head: Normocephalic and atraumatic.  Mouth/Throat: Uvula is midline and mucous membranes are normal. No trismus in the jaw. Normal dentition. No uvula swelling. Posterior oropharyngeal erythema present. No oropharyngeal exudate, posterior oropharyngeal edema or tonsillar abscesses.  Eyes: Right eye exhibits no discharge. Left eye exhibits no discharge. No scleral icterus.  Neck: Normal range of motion.  Cardiovascular: Normal rate, regular rhythm and normal heart sounds.   No murmur heard. Pulmonary/Chest: Effort normal. No accessory muscle usage. Not tachypneic. No respiratory distress. She has wheezes in the right upper field, the left upper field and the left middle field.  Musculoskeletal: Normal range of motion. She exhibits no edema and no tenderness.  Neurological: She is alert. No cranial nerve deficit. Coordination normal. GCS eye subscore is 4. GCS verbal subscore is 5. GCS motor subscore is 6.  Patient fully alert, answering questions appropriately in full, clear sentences.  Skin: Skin is warm and dry. No rash noted. She is not diaphoretic.  Psychiatric: She has a normal mood and affect.    ED Course  Procedures (including critical care time) Labs Review Labs Reviewed - No data to display  Imaging Review Dg Chest 2 View  12/09/2013   CLINICAL DATA:  Cough  EXAM: CHEST  2 VIEW  COMPARISON:  None.  FINDINGS: Cardiac shadow  is within normal limits. Changes of prior left rib fractures with healing are noted. No acute infiltrate or sizable effusion is seen.  IMPRESSION: No active cardiopulmonary disease.   Electronically Signed   By: Inez Catalina M.D.   On: 12/09/2013 13:42     EKG Interpretation None      MDM   Final diagnoses:  Cough  Bronchitis  Asthma exacerbation    Cold symptoms 2 weeks, initially nasal congestion with sore throat, then becoming productive cough with discolored sputum. History of asthma. Patient stating her sore throat and nasal congestion have improved, cough has become worse. She reports using albuterol inhaler multiple times a day. Initially with expiratory wheezes and upper lobes. Will workup with chest x-ray, DuoNeb treatment and prednisone.  Chest radiograph of the impression of no active cardiopulmonary disease. No evidence of pneumonia or consolidation.   On reexam patient states she is feeling  partly 60s percent better, and states she feels like she can breathe again and is ready to go home. Patient's lungs are clear on reexam, patient is well-appearing and in no respiratory distress. Patient discharged on azithromycin and prednisone for possible atypical pneumonia, and given strict return precautions. I strongly encouraged patient to follow up with her PCP, and encouraged patient to call or return to the ER should her symptoms return, worsen, persist or should she have to questions or concerns.  BP 120/83  Pulse 85  Temp(Src) 98.6 F (37 C) (Oral)  Resp 16  Ht 5\' 9"  (1.753 m)  Wt 208 lb (94.348 kg)  BMI 30.70 kg/m2  SpO2 96%  LMP 11/18/2013  Signed,  Dahlia Bailiff, PA-C 5:30 PM     Carrie Mew, PA-C 12/09/13 1730

## 2013-12-09 NOTE — ED Notes (Signed)
Pt reports cold/flu like symptoms for a "few weeks." Has been taking OTC meds without relief. Pt c/o productive cough with "orangish brown" sputum. Fever a few days ago. Pt also reports sore throat.

## 2013-12-09 NOTE — Discharge Instructions (Signed)
Acute Bronchitis °Bronchitis is inflammation of the airways that extend from the windpipe into the lungs (bronchi). The inflammation often causes mucus to develop. This leads to a cough, which is the most common symptom of bronchitis.  °In acute bronchitis, the condition usually develops suddenly and goes away over time, usually in a couple weeks. Smoking, allergies, and asthma can make bronchitis worse. Repeated episodes of bronchitis may cause further lung problems.  °CAUSES °Acute bronchitis is most often caused by the same virus that causes a cold. The virus can spread from person to person (contagious) through coughing, sneezing, and touching contaminated objects. °SIGNS AND SYMPTOMS  °· Cough.   °· Fever.   °· Coughing up mucus.   °· Body aches.   °· Chest congestion.   °· Chills.   °· Shortness of breath.   °· Sore throat.   °DIAGNOSIS  °Acute bronchitis is usually diagnosed through a physical exam. Your health care provider will also ask you questions about your medical history. Tests, such as chest X-rays, are sometimes done to rule out other conditions.  °TREATMENT  °Acute bronchitis usually goes away in a couple weeks. Oftentimes, no medical treatment is necessary. Medicines are sometimes given for relief of fever or cough. Antibiotic medicines are usually not needed but may be prescribed in certain situations. In some cases, an inhaler may be recommended to help reduce shortness of breath and control the cough. A cool mist vaporizer may also be used to help thin bronchial secretions and make it easier to clear the chest.  °HOME CARE INSTRUCTIONS °· Get plenty of rest.   °· Drink enough fluids to keep your urine clear or pale yellow (unless you have a medical condition that requires fluid restriction). Increasing fluids may help thin your respiratory secretions (sputum) and reduce chest congestion, and it will prevent dehydration.   °· Take medicines only as directed by your health care provider. °· If  you were prescribed an antibiotic medicine, finish it all even if you start to feel better. °· Avoid smoking and secondhand smoke. Exposure to cigarette smoke or irritating chemicals will make bronchitis worse. If you are a smoker, consider using nicotine gum or skin patches to help control withdrawal symptoms. Quitting smoking will help your lungs heal faster.   °· Reduce the chances of another bout of acute bronchitis by washing your hands frequently, avoiding people with cold symptoms, and trying not to touch your hands to your mouth, nose, or eyes.   °· Keep all follow-up visits as directed by your health care provider.   °SEEK MEDICAL CARE IF: °Your symptoms do not improve after 1 week of treatment.  °SEEK IMMEDIATE MEDICAL CARE IF: °· You develop an increased fever or chills.   °· You have chest pain.   °· You have severe shortness of breath. °· You have bloody sputum.   °· You develop dehydration. °· You faint or repeatedly feel like you are going to pass out. °· You develop repeated vomiting. °· You develop a severe headache. °MAKE SURE YOU:  °· Understand these instructions. °· Will watch your condition. °· Will get help right away if you are not doing well or get worse. °Document Released: 03/07/2004 Document Revised: 06/14/2013 Document Reviewed: 07/21/2012 °ExitCare® Patient Information ©2015 [employer]. This information is not intended to replace advice given to you by your health care provider. Make sure you discuss any questions you have with your health care provider. ° ° °Emergency Department Resource Guide °1) Find a Doctor and Pay Out of Pocket °Although you won't have to find out who is   covered by your insurance plan, it is a good idea to ask around and get recommendations. You will then need to call the office and see if the doctor you have chosen will accept you as a new patient and what types of options they offer for patients who are self-pay. Some doctors offer discounts or will set up  payment plans for their patients who do not have insurance, but you will need to ask so you aren't surprised when you get to your appointment. ° °2) Contact Your Local Health Department °Not all health departments have doctors that can see patients for sick visits, but many do, so it is worth a call to see if yours does. If you don't know where your local health department is, you can check in your phone book. The CDC also has a tool to help you locate your state's health department, and many state websites also have listings of all of their local health departments. ° °3) Find a Walk-in Clinic °If your illness is not likely to be very severe or complicated, you may want to try a walk in clinic. These are popping up all over the country in pharmacies, drugstores, and shopping centers. They're usually staffed by nurse practitioners or physician assistants that have been trained to treat common illnesses and complaints. They're usually fairly quick and inexpensive. However, if you have serious medical issues or chronic medical problems, these are probably not your best option. ° °No Primary Care Doctor: °- Call [facility] at  832-8000 - they can help you locate a primary care doctor that  accepts your insurance, provides certain services, etc. °- Physician Referral Service- 1-800-533-3463 ° °Chronic Pain Problems: °Organization         Address  Phone   Notes  °[facility]  (336) 297-2271 Patients need to be referred by their primary care doctor.  ° °Medication Assistance: °Organization         Address  Phone   Notes  °[place] [facility] [address] °[place], [place] [address] (336) 641-8030 --Must be a resident of [place] °-- Must have NO insurance coverage whatsoever (no Medicaid/ Medicare, etc.) °-- The pt. MUST have a primary care doctor that directs their care regularly and follows them in the community °  °[facility]  (866) 331-1348   °[employer]  (888) 892-1162   ° °Agencies that provide inexpensive medical care: °Organization         Address  Phone   Notes  °[facility]  (336) 832-8035   °[facility]    (336) 832-7272   °[facility] [address] °[place], [place] [address] (336) 832-4777   °[facility] [address], °[place] (336) 271-4999   °[facility]    (336) 373-0678   °[facility]    (336) 272-1050   °[facility] ° [address], [place] Phone:  (336) 832-4444, Fax:  (336) 832-4440 Hours of Operation:  9 am - 6 pm, M-F.  Also accepts Medicaid/Medicare and self-pay.  °[facility] ° [address], [place] Phone: (336) [phone], Fax: (336) [phone]. Hours of Operation:  8:30 am - 5:30 pm, M-F.  Also accepts Medicaid and self-pay.  °[facility] [place] [address], [place] Phone: (336) 878-6027   °[facility] [address], [place], [place] (336)723-1848, Ext.   123 Mondays & Thursdays: 7-9 AM.  First 15 patients are seen on a first come, first serve basis. °  ° °Medicaid-accepting [place] Providers: ° °Organization         Address  Phone   Notes  °[facility] [address], [place] (336) 641-2100 Also accepts self-pay patients.  °[facility] [address], [place] ° (336) 856-9996   °[facility] [address], [place] (336) 288-8857   °[facility] [address] [place] [address], [place] (336) 299-7000   °Veita Bland [address], [place]  ° (336) 373-1557 Only accepts [place] Access Medicaid patients after they have their name applied to their card.  ° °Self-Pay (no insurance) in [place]: ° °Organization         Address  Phone   Notes  °Sickle Cell Patients, [facility] [address], [place]  (336) 832-1970   °[facility] [address], [place] (336) 832-4400   °[facility] ° [address], [place] (336) 992-4800   °[facility]/Dr. Osei-Bonsu ° [address] [place] [address], [place] or [address], [place] (336) 841-8500 Phone number for both [place] and [place] locations is the same.  °[facility] [address], [place] (336) 299-0000   °[facility] [address] [place] [address], [place] or [address] (336) 852-7530 °(336) 878-2260   °[facility] [address], [place] (336) 350-1642, phone; (336) 294-5005, fax Sees patients 1st and 3rd Saturday of every month.  Must not qualify for public or private insurance (i.e. Medicaid, Medicare, [place] Health Choice, Veterans' Benefits) • Household income should be no more than 200% of the poverty level •The clinic cannot treat you if you are pregnant or think you are pregnant • Sexually transmitted diseases are not treated at the clinic.  ° ° °Dental Care: °Organization         Address  Phone  Notes  °[facility] [address], [place] (336) 641-6152 Accepts children up to age 21 who are enrolled in Medicaid or [place] Health Choice; pregnant women with a Medicaid card; and children who have applied for Medicaid or [place] Health Choice, but were declined, whose parents can pay a reduced fee at time of service.  °[facility] [place]  [address], [place] (336) 641-7733 Accepts children up to age 21 who are enrolled in Medicaid or [place] Health Choice; pregnant women with a Medicaid card; and children who have applied for Medicaid or [place] Health Choice, but were declined, whose parents can pay a reduced fee at time of service.  °[facility] ° [address], [place] (336) 641-4533 Patients  are seen by appointment only. Walk-ins are not accepted. [facility] will see patients 18 years of age and older. °Monday - Tuesday (8am-5pm) °Most Wednesdays (8:30-5pm) °$30 per visit, cash only  °[facility] ° [address], [place] (336) 641-4533 Patients are seen by appointment only. Walk-ins are not accepted. [facility] will see patients 18 years of age and older. °One Wednesday Evening (Monthly: Volunteer Based).  $30 per visit, cash only  °[facility]  (919) 537-3737 for   adults; Children under age 4, call Graduate Pediatric Dentistry at ([phone]) [phone]. Children aged 4-14, please call (919) [phone] to request a pediatric application. ° Dental services are provided in all areas of dental care including fillings, crowns and bridges, complete and partial dentures, implants, gum treatment, root canals, and extractions. Preventive care is also provided. Treatment is provided to both adults and children. °Patients are selected via a lottery and there is often a waiting list. °  °[facility] [address], °[place] ° (336) 763-8833 [email or web address] °  °[facility] [address], [place], [place] (336)723-1848, Ext. 123 Second and Fourth Thursday of each month, opens at 6:30 AM; Clinic ends at 9 AM.  Patients are seen on a first-come first-served basis, and a limited number are seen during each clinic.  ° °[facility] ° [address], [place], [place] (336) 723-7904   Eligibility Requirements °You must have lived in [place], [place], or [place] counties for at least the last three months. °  You cannot be eligible for state or federal sponsored healthcare insurance, including Veterans Administration, Medicaid, or Medicare. °  You generally cannot be eligible for healthcare insurance through your employer.  °  How to apply: °Eligibility screenings are held every Tuesday and Wednesday afternoon from 1:00 pm until  4:00 pm. You do not need an appointment for the interview!  °[facility] [address], [place], [place] 336-631-2330   °[facility]  336-342-8273   °[facility]  336-703-3100   °[facility]  336-570-6415   ° °Behavioral Health Resources in the Community: °Intensive Outpatient Programs °Organization         Address  Phone  Notes  °[facility] [address], [place], [place] 336-878-6098   °[facility] [address], [place], [place] 336-832-9800   °[facility] [address], [place], [place] ° 336-882-2125   °[facility] [address],  °[place], [place] 1-800-853-5163 or 336-641-4981   °Substance Abuse Resources °Organization         Address  Phone  Notes  °[facility]  336-882-2125   °[facility]  336-784-9470   °[facility]  336-285-9073   °[facility]  336-845-3988   °Residential & Outpatient Substance Abuse Program  1-800-659-3381   °Psychological Services °Organization         Address  Phone  Notes  °[facility]  336- 832-9600   °[facility]  336- 378-7881   °[facility] [address], [place] 1-800-853-5163 or 336-641-4981   ° °Mobile Crisis Teams °Organization         Address  Phone  Notes  °[facility], Mobile Crisis Care Unit  1-877-626-1772   °Assertive °[facility] ° [address] [place], [place] 336-834-9664   °Sharon DeEsch [address] °[place] 336-554-5454   ° °Self-Help/Support Groups °Organization         Address  Phone             Notes  °[facility]  - variety of support groups  336- 373-1402 Call for more information  °Narcotics Anonymous (NA), [facility] [address], °[place]   2 meetings at this location  ° °Residential Treatment Programs °Organization          Address  Phone  Notes  °[facility] [address],    °[place]  1-866-801-8205   °[facility] ° [address], [place], [place] 704-293-8524   °[facility] [address], [place] 336-845-3988 Admissions: 8am-3pm M-F  °[facility] [address]    °[place], [place] 336-841-1104   °[facility] [address], [place], [place] 336-379-7146   °[facility] [address]  °[place], [place] 336-285-9073   °[facility] Intensive Outpatient [address], [place], [place] 336-852-3033   °[facility] [address]  °[place], [place] 1-877-615-2722 or 336-784-9470   °[facility] [address] [place], [place] 336-227-7417 Accepts Medicaid  °[facility] [address]  ° [place] 1-800-659-3381 Substance Abuse/Addiction Treatment  ° °[place] Behavioral Health Resources °Organization         Address  Phone  Notes  °[facility]  (888) 581-9988   °Julie Brannon, PhD [address] [place], [place]   (336) 349-5553 or (336) 951-0000   °[facility]   [address] °[place], [place] (336) 349-4454   °[facility] [address], [place], [place] (336) 342-8316 Insurance/Medicaid/sponsorship through [facility]  °[facility] [address]                                    [place], [place] (336) 342-8316 Therapy/tele-psych/case  °[facility] [address]  ° [place], [place] (336) 349-2233    °Dr. Arfeen  (336) 349-4544   °[facility] [place]  [employer] [place] [facility] 1) [address], [place] °2) [address], [place] °3)  [address], [place] (336) 349-3220 °(336) 342-7768 ° °(336) 342-8140   °[place] [facility] (336) 342-1394 or (336) 342-3537 (After Hours)    ° ° ° °

## 2013-12-10 NOTE — ED Provider Notes (Signed)
Medical screening examination/treatment/procedure(s) were performed by non-physician practitioner and as supervising physician I was immediately available for consultation/collaboration.   EKG Interpretation None       Nat Christen, MD 12/10/13 [phone]

## 2013-12-13 ENCOUNTER — Encounter (HOSPITAL_COMMUNITY): Payer: Self-pay | Admitting: Emergency Medicine

## 2013-12-14 NOTE — Telephone Encounter (Signed)
Called patient's mother back asking her to call me back because I had not hurd from her.

## 2013-12-16 NOTE — Telephone Encounter (Signed)
Called patient's mother back and she is still not sure where she want's her daughter to go patient's mother  Will call me back when she is ready to schedule.

## 2013-12-20 ENCOUNTER — Telehealth: Payer: Self-pay | Admitting: Neurology

## 2013-12-20 NOTE — Telephone Encounter (Signed)
Patient's mother calling to state that patient's doctor suggested that a neuropsychologist can do the evaluation, states that she found one in [place], please return call and advise.

## 2013-12-24 NOTE — Telephone Encounter (Signed)
Faxed notes and order to  [facility]  . For appt. With Dr. Marlane Hatcher. Phone # [phone] Fax # [phone].

## 2013-12-24 NOTE — Telephone Encounter (Signed)
Spoke with Mrs. Lassitor She wants her to see Dr. Marlane Hatcher. She is at  [facility]. [phone] Told her I will get the information sent today.

## 2013-12-31 ENCOUNTER — Other Ambulatory Visit: Payer: Self-pay | Admitting: General Practice

## 2014-01-11 DIAGNOSIS — S069X4S Unspecified intracranial injury with loss of consciousness of 6 hours to 24 hours, sequela: Secondary | ICD-10-CM | POA: Diagnosis not present

## 2014-01-11 DIAGNOSIS — R413 Other amnesia: Secondary | ICD-10-CM | POA: Diagnosis not present

## 2014-02-02 ENCOUNTER — Telehealth: Payer: Self-pay | Admitting: Neurology

## 2014-02-02 NOTE — Telephone Encounter (Signed)
i called patient back. Advised her to see psychiatry. She may ask her PCP for a referral. -VRP

## 2014-02-02 NOTE — Telephone Encounter (Signed)
WID - Patient requesting Rx for Anxiety, states its unmanageable.  Please call and advise.

## 2014-02-18 ENCOUNTER — Ambulatory Visit: Payer: Medicare Other | Admitting: Neurology

## 2014-02-24 ENCOUNTER — Ambulatory Visit (INDEPENDENT_AMBULATORY_CARE_PROVIDER_SITE_OTHER): Payer: Medicare Other | Admitting: Neurology

## 2014-02-24 ENCOUNTER — Encounter: Payer: Self-pay | Admitting: Neurology

## 2014-02-24 VITALS — BP 125/82 | HR 82 | Ht 69.0 in | Wt 198.0 lb

## 2014-02-24 DIAGNOSIS — F329 Major depressive disorder, single episode, unspecified: Secondary | ICD-10-CM | POA: Diagnosis not present

## 2014-02-24 DIAGNOSIS — S069X1D Unspecified intracranial injury with loss of consciousness of 30 minutes or less, subsequent encounter: Secondary | ICD-10-CM

## 2014-02-24 DIAGNOSIS — F32A Depression, unspecified: Secondary | ICD-10-CM

## 2014-02-24 DIAGNOSIS — G43709 Chronic migraine without aura, not intractable, without status migrainosus: Secondary | ICD-10-CM | POA: Diagnosis not present

## 2014-02-24 MED ORDER — ARIPIPRAZOLE 5 MG PO TABS: 5.0000 mg | ORAL_TABLET | Freq: Every day | ORAL | Status: DC
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
## 2014-02-28 ENCOUNTER — Ambulatory Visit: Payer: Medicare Other

## 2014-03-01 ENCOUNTER — Ambulatory Visit (INDEPENDENT_AMBULATORY_CARE_PROVIDER_SITE_OTHER): Payer: Medicare Other | Admitting: *Deleted

## 2014-03-01 DIAGNOSIS — N806 Endometriosis in cutaneous scar: Secondary | ICD-10-CM

## 2014-03-01 MED ORDER — LEUPROLIDE ACETATE (3 MONTH) 11.25 MG IM KIT
11.2500 mg | PACK | Freq: Once | INTRAMUSCULAR | Status: AC
  Administered 2014-03-01: 11.25 mg via INTRAMUSCULAR

## 2014-03-07 ENCOUNTER — Telehealth: Payer: Self-pay | Admitting: Neurology

## 2014-03-07 NOTE — Telephone Encounter (Signed)
Pt is calling stating that the ARIPiprazole (ABILIFY) 5 MG tablet is making her more agitated and she has been hallucinating.  She is leaving going out of town tomorrow and would like a phone call back today. Please call and advise.

## 2014-03-07 NOTE — Telephone Encounter (Signed)
Spoke to patient and mother - she will stop the Abilify and make follow up appointment with PCP.  Also, her mother is going to research psychiatrists close to their home.

## 2014-03-07 NOTE — Telephone Encounter (Signed)
I called back.  Patient says after taking Abilify for approximately two days she noticed she has been even more agitated than before, and has begun hallucinating.  She would like to know if the medication should be changed to something else.  Would like a medication that will "calm her nerves".  Please advise.  Thank you.

## 2014-03-07 NOTE — Telephone Encounter (Signed)
Michelle: Please call patient, chart reviewed, she should see her psychologist, or primary care physician, will come back to her manage her long-term mood disorder, if Abilify causes her trouble, not helps her, she can stop it.

## 2014-03-14 ENCOUNTER — Other Ambulatory Visit: Payer: Self-pay | Admitting: General Practice

## 2014-03-25 ENCOUNTER — Ambulatory Visit: Payer: Medicare Other | Admitting: Family Medicine

## 2014-03-29 ENCOUNTER — Encounter: Payer: Self-pay | Admitting: Family Medicine

## 2014-03-29 ENCOUNTER — Ambulatory Visit (INDEPENDENT_AMBULATORY_CARE_PROVIDER_SITE_OTHER): Payer: Medicare Other | Admitting: Family Medicine

## 2014-03-29 VITALS — BP 128/87 | HR 75 | Temp 97.4°F | Ht 68.0 in | Wt 201.6 lb

## 2014-03-29 DIAGNOSIS — S069X5A Unspecified intracranial injury with loss of consciousness greater than 24 hours with return to pre-existing conscious level, initial encounter: Secondary | ICD-10-CM

## 2014-03-29 DIAGNOSIS — F411 Generalized anxiety disorder: Secondary | ICD-10-CM | POA: Diagnosis not present

## 2014-03-29 MED ORDER — DULOXETINE HCL 30 MG PO CPEP: 30.0000 mg | ORAL_CAPSULE | Freq: Every day | ORAL | Status: DC
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
## 2014-03-30 ENCOUNTER — Telehealth: Payer: Self-pay | Admitting: Family Medicine

## 2014-03-30 NOTE — Telephone Encounter (Signed)
Please advise and route to pool B 

## 2014-03-30 NOTE — Telephone Encounter (Signed)
Please send in a prescriptions for Zofran 8 mg orally dissolving. 1 4 times a day when necessary nausea #30 with 2 refills. Please notify the patient when it's done. Thanks, WS.

## 2014-03-31 MED ORDER — ONDANSETRON 8 MG PO TBDP: 8.0000 mg | ORAL_TABLET | Freq: Four times a day (QID) | ORAL | Status: DC | PRN

## 2014-03-31 NOTE — Telephone Encounter (Signed)
Sent in prescription 

## 2014-04-08 ENCOUNTER — Ambulatory Visit: Payer: Medicare Other | Admitting: Neurology

## 2014-04-13 ENCOUNTER — Ambulatory Visit (INDEPENDENT_AMBULATORY_CARE_PROVIDER_SITE_OTHER): Payer: Medicare Other | Admitting: Family Medicine

## 2014-04-13 ENCOUNTER — Encounter: Payer: Self-pay | Admitting: Family Medicine

## 2014-04-13 VITALS — BP 134/87 | HR 61 | Temp 97.4°F | Ht 68.0 in | Wt 200.0 lb

## 2014-04-13 DIAGNOSIS — S069X5S Unspecified intracranial injury with loss of consciousness greater than 24 hours with return to pre-existing conscious level, sequela: Secondary | ICD-10-CM

## 2014-04-13 DIAGNOSIS — F329 Major depressive disorder, single episode, unspecified: Secondary | ICD-10-CM | POA: Diagnosis not present

## 2014-04-13 DIAGNOSIS — L259 Unspecified contact dermatitis, unspecified cause: Secondary | ICD-10-CM | POA: Diagnosis not present

## 2014-04-13 DIAGNOSIS — F32A Depression, unspecified: Secondary | ICD-10-CM

## 2014-04-13 DIAGNOSIS — S069X5A Unspecified intracranial injury with loss of consciousness greater than 24 hours with return to pre-existing conscious level, initial encounter: Secondary | ICD-10-CM

## 2014-04-13 MED ORDER — BETAMETHASONE SOD PHOS & ACET 6 (3-3) MG/ML IJ SUSP
6.0000 mg | Freq: Once | INTRAMUSCULAR | Status: AC
  Administered 2014-04-13: 6 mg via INTRAMUSCULAR

## 2014-04-13 MED ORDER — DULOXETINE HCL 60 MG PO CPEP: 60.0000 mg | ORAL_CAPSULE | Freq: Every day | ORAL | Status: DC

## 2014-04-13 NOTE — Progress Notes (Signed)
Subjective:  Patient ID: Shelby [name], female    DOB: 03/05/1975  Age: 39 y.o. MRN: [id number]  CC: Manic Behavior   HPI ANIELA CANIGLIA presents for an very well with mood she has been much more mellow. She is getting a little bit agitated because she's been itching for a week now. She is not sure what she contacted or wound and she is somewhat concerned that there may be a reaction to the duloxetine.  Patient also continues to have memory loss issues. Even though she is calm down some from her agitation and depression that continues. She would like referral to traumatic brain injury clinic.  History [place] has a past medical history of Asthma; Depression; Anxiety; Traumatic brain injury (2001); Migraines; Bad memory; and Arthritis.   She has past surgical history that includes Cesarean section; cyst remove; uterine ablation; Tubal ligation; Umbilical hernia repair (09/20/2011); Incisional hernia repair (12/27/2011); Wound debridement (12/27/2011); and Insertion of mesh (12/27/2011).   Her family history includes Colon cancer in her paternal grandfather; Diabetes in her father.She reports that she has been smoking Cigarettes.  She has a 10 pack-year smoking history. She has never used smokeless tobacco. She reports that she does not drink alcohol or use illicit drugs.  Current Outpatient Prescriptions on File Prior to Visit  Medication Sig Dispense Refill  . albuterol (PROAIR HFA) 108 (90 BASE) MCG/ACT inhaler Inhale 2 puffs into the lungs every 6 (six) hours as needed for wheezing or shortness of breath. 1 Inhaler 4  . DULoxetine (CYMBALTA) 30 MG capsule Take 1 capsule (30 mg total) by mouth daily. Take one daily the first week after that take two a day. Always with your supper 30 capsule 0  . ibuprofen (ADVIL,MOTRIN) 200 MG tablet Take 800 mg by mouth every 8 (eight) hours as needed for headache.    . medroxyPROGESTERone (PROVERA) 5 MG tablet Take 1 tablet (5 mg total) by mouth daily.  30 tablet 6  . ondansetron (ZOFRAN ODT) 8 MG disintegrating tablet Take 1 tablet (8 mg total) by mouth every 6 (six) hours as needed for nausea. 30 tablet 2  . VENTOLIN HFA 108 (90 BASE) MCG/ACT inhaler INHALE 2 PUFFS BY MOUTH EVERY 4 TO 6 HOURS AS NEEDED 18 g 2   No current facility-administered medications on file prior to visit.    ROS Review of Systems  Constitutional: Negative for fever, chills, diaphoresis, appetite change, fatigue and unexpected weight change.  HENT: Negative for congestion, ear pain, hearing loss, postnasal drip, rhinorrhea, sneezing, sore throat and trouble swallowing.   Eyes: Negative for pain.  Respiratory: Negative for cough, chest tightness and shortness of breath.   Cardiovascular: Negative for chest pain and palpitations.  Gastrointestinal: Negative for nausea, vomiting, abdominal pain, diarrhea and constipation.  Genitourinary: Negative for dysuria, frequency and menstrual problem.  Musculoskeletal: Negative for joint swelling and arthralgias.  Skin: Negative for rash.  Neurological: Negative for dizziness, weakness, numbness and headaches.  Psychiatric/Behavioral: Positive for dysphoric mood (improved since last visit some recurrence since she started itching.). Negative for suicidal ideas, self-injury and agitation (resolved since last visit). The patient is not nervous/anxious.     Objective:  BP 134/87 mmHg  Pulse 61  Temp(Src) 97.4 F (36.3 C) (Oral)  Ht 5\' 8"  (1.727 m)  Wt 200 lb (90.719 kg)  BMI 30.42 kg/m2  LMP 03/14/2014  BP Readings from Last 3 Encounters:  04/13/14 134/87  03/29/14 128/87  02/24/14 125/82    Wt Readings from  Last 3 Encounters:  04/13/14 200 lb (90.719 kg)  03/29/14 201 lb 9.6 oz (91.445 kg)  02/24/14 198 lb (89.812 kg)     Physical Exam  Constitutional: She is oriented to person, place, and time. She appears well-developed and well-nourished. No distress.  HENT:  Head: Normocephalic and atraumatic.  Right  Ear: External ear normal.  Left Ear: External ear normal.  Nose: Nose normal.  Mouth/Throat: Oropharynx is clear and moist.  Eyes: Conjunctivae and EOM are normal. Pupils are equal, round, and reactive to light.  Neck: Normal range of motion. Neck supple. No thyromegaly present.  Cardiovascular: Normal rate, regular rhythm and normal heart sounds.   No murmur heard. Pulmonary/Chest: Effort normal and breath sounds normal. No respiratory distress. She has no wheezes. She has no rales.  Abdominal: Soft. Bowel sounds are normal. She exhibits no distension. There is no tenderness.  Lymphadenopathy:    She has no cervical adenopathy.  Neurological: She is alert and oriented to person, place, and time. She has normal reflexes.  Skin: Skin is warm and dry.  Psychiatric: She has a normal mood and affect. Her behavior is normal. Judgment and thought content normal.    No results found for: HGBA1C  Lab Results  Component Value Date   WBC 8.4 12/20/2011   HGB 14.8 12/20/2011   HCT 42.8 12/20/2011   PLT 335 12/20/2011   GLUCOSE 86 05/27/2013   ALT 12 07/26/2011   AST 14 07/26/2011   NA 142 05/27/2013   K 4.6 05/27/2013   CL 103 05/27/2013   CREATININE 0.79 05/27/2013   BUN 9 05/27/2013   CO2 25 05/27/2013   TSH 1.370 05/27/2013    Dg Chest 2 View  12/09/2013   CLINICAL DATA:  Cough  EXAM: CHEST  2 VIEW  COMPARISON:  None.  FINDINGS: Cardiac shadow is within normal limits. Changes of prior left rib fractures with healing are noted. No acute infiltrate or sizable effusion is seen.  IMPRESSION: No active cardiopulmonary disease.   Electronically Signed   By: Inez Catalina M.D.   On: 12/09/2013 13:42    Assessment & Plan:   There are no diagnoses linked to this encounter. I am having Ms. Hassell Done maintain her albuterol, medroxyPROGESTERone, ibuprofen, VENTOLIN HFA, DULoxetine, and ondansetron.  No orders of the defined types were placed in this encounter.    Follow-up: No Follow-up on  file.  Claretta Fraise, M.D.

## 2014-04-13 NOTE — Patient Instructions (Signed)
Follow-up in 3 months with the exception of follow-up in 2-3 days if the itch is not relieved by the injection today. If the itch goes away in the next few days but comes back in a week or 2 he would need to follow-up as well.

## 2014-04-14 ENCOUNTER — Telehealth: Payer: Self-pay | Admitting: Family Medicine

## 2014-04-14 MED ORDER — HYDROXYZINE HCL 25 MG PO TABS: 25.0000 mg | ORAL_TABLET | Freq: Four times a day (QID) | ORAL | Status: DC | PRN

## 2014-04-14 MED ORDER — DULOXETINE HCL 60 MG PO CPEP: 60.0000 mg | ORAL_CAPSULE | Freq: Every day | ORAL | Status: DC

## 2014-04-14 NOTE — Telephone Encounter (Signed)
It can take 2-3 days for the itching to go away after the shot. However, I will send in a prescription for itching that should help along with the shot

## 2014-04-14 NOTE — Telephone Encounter (Signed)
Updated prescription sent to pharmacy. The error was mine. My apology, WS.

## 2014-04-15 NOTE — Telephone Encounter (Signed)
Patient notified that  meds sent in to pharmacy

## 2014-04-20 ENCOUNTER — Telehealth: Payer: Self-pay | Admitting: Family Medicine

## 2014-04-20 NOTE — Telephone Encounter (Signed)
Please review and advise.

## 2014-04-20 NOTE — Telephone Encounter (Signed)
Please advise 

## 2014-04-21 NOTE — Telephone Encounter (Signed)
There is a Traumatic Brain injury clinic that I wanted her to go to. It is at [facility]. It is part of their neurology program if I remember correctly. Thanks, WS

## 2014-04-25 NOTE — Telephone Encounter (Signed)
Pt is aware that referral will be to neurologist at [facility] traumatic brain injury clinic She does want to go ahead with referral

## 2014-05-26 ENCOUNTER — Ambulatory Visit: Payer: Medicare Other | Admitting: Neurology

## 2014-05-27 ENCOUNTER — Telehealth: Payer: Self-pay | Admitting: Neurology

## 2014-05-27 NOTE — Telephone Encounter (Signed)
Pt no showed 05/26/14 Np appt w/ Dr. Delice Lesch. No show letter + policy mailed to pt. Dr. Livia Snellen office notified via [facility] referral notes / Gayleen O[place].

## 2014-06-13 ENCOUNTER — Telehealth: Payer: Self-pay | Admitting: Family Medicine

## 2014-06-14 ENCOUNTER — Telehealth: Payer: Self-pay

## 2014-06-14 NOTE — Telephone Encounter (Signed)
Please find the Traumatic Brain Injury specialist at [facility]. He is in the Neurology department.

## 2014-06-14 NOTE — Telephone Encounter (Signed)
Waiting to hear abour specialist appt Dr Livia Snellen was putting in .   (last referral was in March for Neurology)

## 2014-06-15 ENCOUNTER — Other Ambulatory Visit: Payer: Self-pay

## 2014-06-15 DIAGNOSIS — S069X0A Unspecified intracranial injury without loss of consciousness, initial encounter: Secondary | ICD-10-CM

## 2014-06-19 ENCOUNTER — Other Ambulatory Visit: Payer: Self-pay | Admitting: Obstetrics & Gynecology

## 2014-07-01 ENCOUNTER — Telehealth: Payer: Self-pay | Admitting: Family Medicine

## 2014-07-01 DIAGNOSIS — M25539 Pain in unspecified wrist: Secondary | ICD-10-CM

## 2014-07-01 NOTE — Telephone Encounter (Signed)
Referral made 

## 2014-07-01 NOTE — Telephone Encounter (Signed)
Pt notified of referral  

## 2014-07-06 ENCOUNTER — Telehealth: Payer: Self-pay

## 2014-07-06 NOTE — Telephone Encounter (Signed)
What is the hold up on getting me in with Brain trauma specialist   Not neurology   I already have a neurologist

## 2014-07-06 NOTE — Telephone Encounter (Signed)
I called the patient to inform her and she said that she is on disability so she could not travel to [place] or [place].

## 2014-07-06 NOTE — Telephone Encounter (Signed)
Unfortunately the gentleman I have used in the past is no longer available. Have been unable to locate anyone else in the vicinity. If she is willing to travel to a major facility such as [facility] or [facility] that would be the next step.

## 2014-07-13 NOTE — Telephone Encounter (Signed)
Patient said she could not travel to [facility] in [place] or [place] in [place] due to being on [place]

## 2014-07-15 ENCOUNTER — Ambulatory Visit: Payer: Medicare Other | Admitting: Family Medicine

## 2014-07-18 ENCOUNTER — Encounter: Payer: Self-pay | Admitting: Family Medicine

## 2014-07-18 DIAGNOSIS — G5601 Carpal tunnel syndrome, right upper limb: Secondary | ICD-10-CM | POA: Diagnosis not present

## 2014-07-18 DIAGNOSIS — G542 Cervical root disorders, not elsewhere classified: Secondary | ICD-10-CM | POA: Diagnosis not present

## 2014-07-30 ENCOUNTER — Emergency Department (HOSPITAL_COMMUNITY)
Admission: EM | Admit: 2014-07-30 | Discharge: 2014-07-30 | Disposition: A | Discharge status: Home / Self Care | Payer: Medicare Other | Attending: Emergency Medicine | Admitting: Emergency Medicine

## 2014-07-30 ENCOUNTER — Encounter (HOSPITAL_COMMUNITY): Payer: Self-pay | Admitting: Emergency Medicine

## 2014-07-30 DIAGNOSIS — Z720 Tobacco use: Secondary | ICD-10-CM | POA: Insufficient documentation

## 2014-07-30 DIAGNOSIS — Z4801 Encounter for change or removal of surgical wound dressing: Secondary | ICD-10-CM | POA: Diagnosis present

## 2014-07-30 DIAGNOSIS — Y9289 Other specified places as the place of occurrence of the external cause: Secondary | ICD-10-CM | POA: Diagnosis not present

## 2014-07-30 DIAGNOSIS — Y9389 Activity, other specified: Secondary | ICD-10-CM | POA: Diagnosis not present

## 2014-07-30 DIAGNOSIS — S50312A Abrasion of left elbow, initial encounter: Secondary | ICD-10-CM | POA: Diagnosis not present

## 2014-07-30 DIAGNOSIS — F329 Major depressive disorder, single episode, unspecified: Secondary | ICD-10-CM | POA: Insufficient documentation

## 2014-07-30 DIAGNOSIS — Y998 Other external cause status: Secondary | ICD-10-CM | POA: Insufficient documentation

## 2014-07-30 DIAGNOSIS — Z79899 Other long term (current) drug therapy: Secondary | ICD-10-CM | POA: Diagnosis not present

## 2014-07-30 DIAGNOSIS — F419 Anxiety disorder, unspecified: Secondary | ICD-10-CM | POA: Diagnosis not present

## 2014-07-30 DIAGNOSIS — Z8679 Personal history of other diseases of the circulatory system: Secondary | ICD-10-CM | POA: Insufficient documentation

## 2014-07-30 DIAGNOSIS — Z880 Allergy status to penicillin: Secondary | ICD-10-CM | POA: Diagnosis not present

## 2014-07-30 DIAGNOSIS — Z87820 Personal history of traumatic brain injury: Secondary | ICD-10-CM | POA: Diagnosis not present

## 2014-07-30 DIAGNOSIS — G43909 Migraine, unspecified, not intractable, without status migrainosus: Secondary | ICD-10-CM | POA: Insufficient documentation

## 2014-07-30 DIAGNOSIS — T148XXA Other injury of unspecified body region, initial encounter: Secondary | ICD-10-CM

## 2014-07-30 DIAGNOSIS — J45909 Unspecified asthma, uncomplicated: Secondary | ICD-10-CM | POA: Insufficient documentation

## 2014-07-30 DIAGNOSIS — X58XXXA Exposure to other specified factors, initial encounter: Secondary | ICD-10-CM | POA: Insufficient documentation

## 2014-07-30 DIAGNOSIS — Z91040 Latex allergy status: Secondary | ICD-10-CM | POA: Insufficient documentation

## 2014-07-30 MED ORDER — MUPIROCIN CALCIUM 2 % EX CREA: 1.0000 "application " | TOPICAL_CREAM | Freq: Two times a day (BID) | CUTANEOUS | Status: DC
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
## 2014-07-30 NOTE — Discharge Instructions (Signed)
Abrasion An abrasion is a cut or scrape of the skin. Abrasions do not extend through all layers of the skin and most heal within 10 days. It is important to care for your abrasion properly to prevent infection. CAUSES  Most abrasions are caused by falling on, or gliding across, the ground or other surface. When your skin rubs on something, the outer and inner layer of skin rubs off, causing an abrasion. DIAGNOSIS  Your caregiver will be able to diagnose an abrasion during a physical exam.  TREATMENT  Your treatment depends on how large and deep the abrasion is. Generally, your abrasion will be cleaned with water and a mild soap to remove any dirt or debris. An antibiotic ointment may be put over the abrasion to prevent an infection. A bandage (dressing) may be wrapped around the abrasion to keep it from getting dirty.  You may need a tetanus shot if:  You cannot remember when you had your last tetanus shot.  You have never had a tetanus shot.  The injury broke your skin. If you get a tetanus shot, your arm may swell, get red, and feel warm to the touch. This is common and not a problem. If you need a tetanus shot and you choose not to have one, there is a rare chance of getting tetanus. Sickness from tetanus can be serious.  HOME CARE INSTRUCTIONS   If a dressing was applied, change it at least once a day or as directed by your caregiver. If the bandage sticks, soak it off with warm water.   Wash the area with water and a mild soap to remove all the ointment 2 times a day. Rinse off the soap and pat the area dry with a clean towel.   Reapply any ointment as directed by your caregiver. This will help prevent infection and keep the bandage from sticking. Use gauze over the wound and under the dressing to help keep the bandage from sticking.   Change your dressing right away if it becomes wet or dirty.   Only take over-the-counter or prescription medicines for pain, discomfort, or fever as  directed by your caregiver.   Follow up with your caregiver within 24-48 hours for a wound check, or as directed. If you were not given a wound-check appointment, look closely at your abrasion for redness, swelling, or pus. These are signs of infection. SEEK IMMEDIATE MEDICAL CARE IF:   You have increasing pain in the wound.   You have redness, swelling, or tenderness around the wound.   You have pus coming from the wound.   You have a fever or persistent symptoms for more than 2-3 days.  You have a fever and your symptoms suddenly get worse.  You have a bad smell coming from the wound or dressing.  MAKE SURE YOU:   Understand these instructions.  Will watch your condition.  Will get help right away if you are not doing well or get worse. Document Released: 11/07/2004 Document Revised: 01/15/2012 Document Reviewed: 01/01/2011 [facility] Patient Information 2015 [place], [place]. This information is not intended to replace advice given to you by your health care provider. Make sure you discuss any questions you have with your health care provider.   You may also use an anti itch cream such as [place] anti - itch cream.

## 2014-07-30 NOTE — ED Notes (Addendum)
Patient c/o wound to left elbow/AC. Elbow dressed with gauze dressing, clean and intact. Per patient "for past few days felt like something was biting her under the skin." Per patient nothing visible on skin at time. Patient reports itching in which she has scratched and now has open wound. Denies any fevers or drainage. Per patient some swelling in elbow.

## 2014-08-08 ENCOUNTER — Ambulatory Visit: Payer: Medicare Other | Admitting: Family Medicine

## 2014-08-20 DIAGNOSIS — G5601 Carpal tunnel syndrome, right upper limb: Secondary | ICD-10-CM | POA: Diagnosis not present

## 2014-08-29 DIAGNOSIS — G5601 Carpal tunnel syndrome, right upper limb: Secondary | ICD-10-CM | POA: Diagnosis not present

## 2014-08-31 ENCOUNTER — Encounter: Payer: Self-pay | Admitting: Nurse Practitioner

## 2014-08-31 ENCOUNTER — Ambulatory Visit (INDEPENDENT_AMBULATORY_CARE_PROVIDER_SITE_OTHER): Payer: Self-pay | Admitting: Nurse Practitioner

## 2014-08-31 VITALS — BP 124/82 | HR 77 | Ht 69.0 in | Wt 208.5 lb

## 2014-08-31 DIAGNOSIS — R413 Other amnesia: Secondary | ICD-10-CM

## 2014-08-31 DIAGNOSIS — G43709 Chronic migraine without aura, not intractable, without status migrainosus: Secondary | ICD-10-CM

## 2014-08-31 DIAGNOSIS — S069X5D Unspecified intracranial injury with loss of consciousness greater than 24 hours with return to pre-existing conscious level, subsequent encounter: Secondary | ICD-10-CM

## 2014-08-31 DIAGNOSIS — F32A Depression, unspecified: Secondary | ICD-10-CM

## 2014-08-31 DIAGNOSIS — F419 Anxiety disorder, unspecified: Secondary | ICD-10-CM

## 2014-08-31 DIAGNOSIS — F329 Major depressive disorder, single episode, unspecified: Secondary | ICD-10-CM

## 2014-08-31 NOTE — Patient Instructions (Signed)
F/U as needed
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
## 2014-09-05 DIAGNOSIS — R200 Anesthesia of skin: Secondary | ICD-10-CM | POA: Diagnosis not present

## 2014-09-05 DIAGNOSIS — G5601 Carpal tunnel syndrome, right upper limb: Secondary | ICD-10-CM | POA: Diagnosis not present

## 2014-09-10 DIAGNOSIS — M542 Cervicalgia: Secondary | ICD-10-CM | POA: Diagnosis not present

## 2014-09-26 DIAGNOSIS — R200 Anesthesia of skin: Secondary | ICD-10-CM | POA: Diagnosis not present

## 2014-09-26 DIAGNOSIS — M542 Cervicalgia: Secondary | ICD-10-CM | POA: Diagnosis not present

## 2014-09-30 ENCOUNTER — Encounter: Payer: Self-pay | Admitting: Neurology

## 2014-09-30 ENCOUNTER — Ambulatory Visit (INDEPENDENT_AMBULATORY_CARE_PROVIDER_SITE_OTHER): Payer: Medicare Other | Admitting: Neurology

## 2014-09-30 VITALS — BP 121/77 | HR 78 | Ht 67.0 in | Wt 210.4 lb

## 2014-09-30 DIAGNOSIS — R51 Headache: Secondary | ICD-10-CM

## 2014-09-30 DIAGNOSIS — R413 Other amnesia: Secondary | ICD-10-CM

## 2014-09-30 DIAGNOSIS — S069X1A Unspecified intracranial injury with loss of consciousness of 30 minutes or less, initial encounter: Secondary | ICD-10-CM

## 2014-09-30 DIAGNOSIS — R0683 Snoring: Secondary | ICD-10-CM

## 2014-09-30 DIAGNOSIS — G43009 Migraine without aura, not intractable, without status migrainosus: Secondary | ICD-10-CM

## 2014-09-30 DIAGNOSIS — R519 Headache, unspecified: Secondary | ICD-10-CM

## 2014-09-30 DIAGNOSIS — G4719 Other hypersomnia: Secondary | ICD-10-CM

## 2014-09-30 MED ORDER — NORTRIPTYLINE HCL 10 MG PO CAPS: 20.0000 mg | ORAL_CAPSULE | Freq: Every day | ORAL | Status: DC
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
## 2014-09-30 NOTE — Patient Instructions (Signed)
Overall you are doing fairly well but I do want to suggest a few things today:   Remember to drink plenty of fluid, eat healthy meals and do not skip any meals. Try to eat protein with a every meal and eat a healthy snack such as fruit or nuts in between meals. Try to keep a regular sleep-wake schedule and try to exercise daily, particularly in the form of walking, 20-30 minutes a day, if you can.   As far as your medications are concerned, I would like to suggest: Nortriptyline 10mg  at bedtime. In 4 weeks can increase to 20mg  (2 tabs). Take an hour before bedtime.   As far as diagnostic testing: MRI of the brain  I would like to see you back in 3 months, sooner if we need to. Please call us with any interim questions, concerns, problems, updates or refill requests.   Please also call us for any test results so we can go over those with you on the phone.  My clinical assistant and will answer any of your questions and relay your messages to me and also relay most of my messages to you.   Our phone number is [phone]. We also have an after hours call service for urgent matters and there is a physician on-call for urgent questions. For any emergencies you know to call 911 or go to the nearest emergency room

## 2014-10-03 ENCOUNTER — Institutional Professional Consult (permissible substitution): Payer: Medicare Other | Admitting: Neurology

## 2014-10-16 DIAGNOSIS — F17200 Nicotine dependence, unspecified, uncomplicated: Secondary | ICD-10-CM | POA: Diagnosis not present

## 2014-10-16 DIAGNOSIS — H109 Unspecified conjunctivitis: Secondary | ICD-10-CM | POA: Diagnosis not present

## 2014-10-19 ENCOUNTER — Ambulatory Visit
Admission: RE | Admit: 2014-10-19 | Discharge: 2014-10-19 | Disposition: A | Discharge status: Home / Self Care | Payer: Medicare Other | Source: Ambulatory Visit | Attending: Neurology | Admitting: Neurology

## 2014-10-19 DIAGNOSIS — R51 Headache: Secondary | ICD-10-CM

## 2014-10-19 DIAGNOSIS — G43009 Migraine without aura, not intractable, without status migrainosus: Secondary | ICD-10-CM

## 2014-10-19 DIAGNOSIS — N281 Cyst of kidney, acquired: Secondary | ICD-10-CM | POA: Diagnosis not present

## 2014-10-19 DIAGNOSIS — R519 Headache, unspecified: Secondary | ICD-10-CM

## 2014-10-19 DIAGNOSIS — G4719 Other hypersomnia: Secondary | ICD-10-CM

## 2014-10-19 DIAGNOSIS — R413 Other amnesia: Secondary | ICD-10-CM

## 2014-10-19 DIAGNOSIS — R200 Anesthesia of skin: Secondary | ICD-10-CM | POA: Diagnosis not present

## 2014-10-19 DIAGNOSIS — K5790 Diverticulosis of intestine, part unspecified, without perforation or abscess without bleeding: Secondary | ICD-10-CM | POA: Diagnosis not present

## 2014-10-19 DIAGNOSIS — R0683 Snoring: Secondary | ICD-10-CM

## 2014-10-19 DIAGNOSIS — S069X1A Unspecified intracranial injury with loss of consciousness of 30 minutes or less, initial encounter: Secondary | ICD-10-CM

## 2014-10-19 MED ORDER — GADOBENATE DIMEGLUMINE 529 MG/ML IV SOLN: 18.0000 mL | Freq: Once | INTRAVENOUS | Status: DC | PRN

## 2014-10-20 ENCOUNTER — Telehealth: Payer: Self-pay | Admitting: *Deleted

## 2014-10-20 NOTE — Telephone Encounter (Signed)
Called and spoke w/ pt about MRI results. Told her there were no changes since the one she had in 2006. Everything is stable. She verbalized understanding.

## 2014-10-27 ENCOUNTER — Telehealth: Payer: Self-pay

## 2014-10-27 ENCOUNTER — Institutional Professional Consult (permissible substitution): Payer: Medicare Other | Admitting: Neurology

## 2014-10-27 NOTE — Telephone Encounter (Signed)
Dr. Brett Fairy out of office today. Called pt to r/s appt. Pt agreeable to coming in 10/10 at 2:00.

## 2014-10-30 ENCOUNTER — Other Ambulatory Visit: Payer: Self-pay | Admitting: Family Medicine

## 2014-10-31 NOTE — Telephone Encounter (Signed)
Last seen 04/2014

## 2014-11-17 DIAGNOSIS — G5601 Carpal tunnel syndrome, right upper limb: Secondary | ICD-10-CM | POA: Diagnosis not present

## 2014-11-21 ENCOUNTER — Institutional Professional Consult (permissible substitution): Payer: Medicare Other | Admitting: Neurology

## 2014-11-25 DIAGNOSIS — Z4789 Encounter for other orthopedic aftercare: Secondary | ICD-10-CM | POA: Diagnosis not present

## 2014-11-29 DIAGNOSIS — M6281 Muscle weakness (generalized): Secondary | ICD-10-CM | POA: Diagnosis not present

## 2014-12-16 DIAGNOSIS — Z4789 Encounter for other orthopedic aftercare: Secondary | ICD-10-CM | POA: Diagnosis not present

## 2014-12-25 ENCOUNTER — Other Ambulatory Visit: Payer: Self-pay | Admitting: Family Medicine

## 2014-12-25 ENCOUNTER — Other Ambulatory Visit: Payer: Self-pay | Admitting: Obstetrics & Gynecology

## 2014-12-26 NOTE — Telephone Encounter (Signed)
Last seen 04/2014 

## 2014-12-26 NOTE — Telephone Encounter (Signed)
Pt notified [employer] 

## 2015-01-03 ENCOUNTER — Telehealth: Payer: Self-pay | Admitting: *Deleted

## 2015-01-03 ENCOUNTER — Other Ambulatory Visit: Payer: Self-pay | Admitting: Family Medicine

## 2015-01-03 ENCOUNTER — Ambulatory Visit: Payer: Medicare Other | Admitting: Neurology

## 2015-01-03 NOTE — Telephone Encounter (Signed)
no showed f/u appt 

## 2015-01-04 NOTE — Telephone Encounter (Signed)
Last seen 04/13/14 Dr Stacks 

## 2015-01-04 NOTE — Telephone Encounter (Signed)
Pt notified [employer] Verbalizes understanding 

## 2015-01-04 NOTE — Telephone Encounter (Signed)
Medication called in, patient aware 

## 2015-01-06 ENCOUNTER — Ambulatory Visit: Payer: Medicare Other | Admitting: Family Medicine

## 2015-01-09 ENCOUNTER — Encounter: Payer: Self-pay | Admitting: Neurology

## 2015-01-09 ENCOUNTER — Encounter: Payer: Self-pay | Admitting: Family Medicine

## 2015-01-31 DIAGNOSIS — Z4789 Encounter for other orthopedic aftercare: Secondary | ICD-10-CM | POA: Diagnosis not present

## 2015-02-05 ENCOUNTER — Other Ambulatory Visit: Payer: Self-pay | Admitting: Family Medicine

## 2015-02-07 NOTE — Telephone Encounter (Signed)
Last seen 04/13/14 Dr Stacks 

## 2015-02-08 ENCOUNTER — Other Ambulatory Visit: Payer: Self-pay

## 2015-02-08 MED ORDER — DULOXETINE HCL 60 MG PO CPEP: 60.0000 mg | ORAL_CAPSULE | Freq: Every day | ORAL | Status: DC

## 2015-02-08 NOTE — Telephone Encounter (Signed)
Last seen 04/13/14 Dr Stacks 

## 2015-02-09 ENCOUNTER — Ambulatory Visit (INDEPENDENT_AMBULATORY_CARE_PROVIDER_SITE_OTHER): Payer: Medicare Other | Admitting: Family Medicine

## 2015-02-09 ENCOUNTER — Encounter: Payer: Self-pay | Admitting: Family Medicine

## 2015-02-09 VITALS — BP 126/73 | HR 86 | Temp 97.4°F | Ht 67.0 in | Wt 213.6 lb

## 2015-02-09 DIAGNOSIS — G5600 Carpal tunnel syndrome, unspecified upper limb: Secondary | ICD-10-CM | POA: Insufficient documentation

## 2015-02-09 DIAGNOSIS — N806 Endometriosis in cutaneous scar: Secondary | ICD-10-CM | POA: Diagnosis not present

## 2015-02-09 DIAGNOSIS — F329 Major depressive disorder, single episode, unspecified: Secondary | ICD-10-CM

## 2015-02-09 DIAGNOSIS — S069X5D Unspecified intracranial injury with loss of consciousness greater than 24 hours with return to pre-existing conscious level, subsequent encounter: Secondary | ICD-10-CM

## 2015-02-09 DIAGNOSIS — G5602 Carpal tunnel syndrome, left upper limb: Secondary | ICD-10-CM

## 2015-02-09 DIAGNOSIS — F419 Anxiety disorder, unspecified: Secondary | ICD-10-CM | POA: Diagnosis not present

## 2015-02-09 DIAGNOSIS — F32A Depression, unspecified: Secondary | ICD-10-CM

## 2015-02-09 MED ORDER — MEDROXYPROGESTERONE ACETATE 5 MG PO TABS: 5.0000 mg | ORAL_TABLET | Freq: Every day | ORAL | Status: DC

## 2015-02-09 MED ORDER — GABAPENTIN 300 MG PO CAPS: 300.0000 mg | ORAL_CAPSULE | Freq: Every day | ORAL | Status: DC

## 2015-02-09 MED ORDER — DULOXETINE HCL 30 MG PO CPEP: 90.0000 mg | ORAL_CAPSULE | Freq: Every day | ORAL | Status: DC
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
## 2015-03-15 DIAGNOSIS — M79642 Pain in left hand: Secondary | ICD-10-CM | POA: Diagnosis not present

## 2015-03-15 DIAGNOSIS — Z4789 Encounter for other orthopedic aftercare: Secondary | ICD-10-CM | POA: Diagnosis not present

## 2015-03-15 DIAGNOSIS — R200 Anesthesia of skin: Secondary | ICD-10-CM | POA: Diagnosis not present

## 2015-03-15 DIAGNOSIS — G5602 Carpal tunnel syndrome, left upper limb: Secondary | ICD-10-CM | POA: Diagnosis not present

## 2015-03-22 ENCOUNTER — Other Ambulatory Visit: Payer: Self-pay | Admitting: Nurse Practitioner

## 2015-04-20 ENCOUNTER — Encounter (HOSPITAL_COMMUNITY): Payer: Self-pay

## 2015-04-20 ENCOUNTER — Emergency Department (HOSPITAL_COMMUNITY)
Admission: EM | Admit: 2015-04-20 | Discharge: 2015-04-21 | Disposition: A | Discharge status: Home / Self Care | Payer: Medicare Other | Attending: Emergency Medicine | Admitting: Emergency Medicine

## 2015-04-20 DIAGNOSIS — J45909 Unspecified asthma, uncomplicated: Secondary | ICD-10-CM | POA: Insufficient documentation

## 2015-04-20 DIAGNOSIS — Z79899 Other long term (current) drug therapy: Secondary | ICD-10-CM | POA: Diagnosis not present

## 2015-04-20 DIAGNOSIS — F17210 Nicotine dependence, cigarettes, uncomplicated: Secondary | ICD-10-CM | POA: Insufficient documentation

## 2015-04-20 DIAGNOSIS — R05 Cough: Secondary | ICD-10-CM | POA: Insufficient documentation

## 2015-04-20 DIAGNOSIS — J039 Acute tonsillitis, unspecified: Secondary | ICD-10-CM | POA: Insufficient documentation

## 2015-04-20 DIAGNOSIS — J029 Acute pharyngitis, unspecified: Secondary | ICD-10-CM | POA: Diagnosis present

## 2015-04-20 MED ORDER — AZITHROMYCIN 250 MG PO TABS: ORAL_TABLET | ORAL | Status: DC

## 2015-04-20 MED ORDER — MAGIC MOUTHWASH W/LIDOCAINE: 5.0000 mL | Freq: Three times a day (TID) | ORAL | Status: DC | PRN

## 2015-04-20 NOTE — Discharge Instructions (Signed)
Tonsillitis Tonsillitis is an infection of the throat. This infection causes the tonsils to become red, tender, and puffy (swollen). Tonsils are groups of tissue at the back of your throat. If bacteria caused your infection, antibiotic medicine will be given to you. Sometimes symptoms of tonsillitis can be relieved with the use of steroid medicine. If your tonsillitis is severe and happens often, you may need to get your tonsils removed (tonsillectomy). HOME CARE   Rest and sleep often.  Drink enough fluids to keep your pee (urine) clear or pale yellow.  While your throat is sore, eat soft or liquid foods like:  Soup.  Ice cream.  Instant breakfast drinks.  Eat frozen ice pops.  Gargle with a warm or cold liquid to help soothe the throat. Gargle with a water and salt mix. Mix 1/4 teaspoon of salt and 1/4 teaspoon of baking soda in 1 cup of water.  Only take medicines as told by your doctor.  If you are given medicines (antibiotics), take them as told. Finish them even if you start to feel better. GET HELP IF:  You have large, tender lumps in your neck.  You have a rash.  You cough up green, yellow-brown, or bloody fluid.  You cannot swallow liquids or food for 24 hours.  You notice that only one of your tonsils is swollen. GET HELP RIGHT AWAY IF:   You throw up (vomit).  You have a very bad headache.  You have a stiff neck.  You have chest pain.  You have trouble breathing or swallowing.  You have bad throat pain, drooling, or your voice changes.  You have bad pain not helped by medicine.  You cannot fully open your mouth.  You have redness, puffiness, or bad pain in the neck.  You have a fever. MAKE SURE YOU:   Understand these instructions.  Will watch your condition.  Will get help right away if you are not doing well or get worse.   This information is not intended to replace advice given to you by your health care provider. Make sure you discuss any  questions you have with your health care provider.   Document Released: 07/17/2007 Document Revised: 02/02/2013 Document Reviewed: 07/17/2012 Elsevier Interactive Patient Education [employer].
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
## 2015-04-20 NOTE — ED Notes (Signed)
My throat has been itching for the past week, now it has became raw and sore per pt.

## 2015-04-21 DIAGNOSIS — J039 Acute tonsillitis, unspecified: Secondary | ICD-10-CM | POA: Diagnosis not present

## 2015-04-21 MED ORDER — AZITHROMYCIN 250 MG PO TABS
500.0000 mg | ORAL_TABLET | Freq: Once | ORAL | Status: AC
  Administered 2015-04-21: 500 mg via ORAL
  Filled 2015-04-21: qty 2

## 2015-05-02 ENCOUNTER — Encounter: Payer: Self-pay | Admitting: Family Medicine

## 2015-05-02 ENCOUNTER — Ambulatory Visit (INDEPENDENT_AMBULATORY_CARE_PROVIDER_SITE_OTHER): Payer: Medicare Other | Admitting: Family Medicine

## 2015-05-02 VITALS — BP 125/79 | HR 91 | Temp 97.4°F | Ht 67.0 in | Wt 217.2 lb

## 2015-05-02 DIAGNOSIS — S069X5D Unspecified intracranial injury with loss of consciousness greater than 24 hours with return to pre-existing conscious level, subsequent encounter: Secondary | ICD-10-CM | POA: Diagnosis not present

## 2015-05-02 DIAGNOSIS — F329 Major depressive disorder, single episode, unspecified: Secondary | ICD-10-CM | POA: Diagnosis not present

## 2015-05-02 DIAGNOSIS — J0100 Acute maxillary sinusitis, unspecified: Secondary | ICD-10-CM | POA: Diagnosis not present

## 2015-05-02 DIAGNOSIS — F32A Depression, unspecified: Secondary | ICD-10-CM

## 2015-05-02 DIAGNOSIS — F411 Generalized anxiety disorder: Secondary | ICD-10-CM | POA: Diagnosis not present

## 2015-05-02 DIAGNOSIS — F3162 Bipolar disorder, current episode mixed, moderate: Secondary | ICD-10-CM

## 2015-05-02 MED ORDER — LAMOTRIGINE 100 MG PO TABS: 100.0000 mg | ORAL_TABLET | Freq: Every day | ORAL | Status: DC

## 2015-05-02 MED ORDER — LEVOFLOXACIN 500 MG PO TABS: 500.0000 mg | ORAL_TABLET | Freq: Every day | ORAL | Status: DC

## 2015-05-02 MED ORDER — PSEUDOEPHEDRINE-GUAIFENESIN ER 60-600 MG PO TB12: 1.0000 | ORAL_TABLET | Freq: Two times a day (BID) | ORAL | Status: AC
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
## 2015-05-12 ENCOUNTER — Ambulatory Visit: Payer: Medicare Other | Admitting: Family Medicine

## 2015-06-07 ENCOUNTER — Telehealth (HOSPITAL_COMMUNITY): Payer: Self-pay | Admitting: *Deleted

## 2015-06-07 NOTE — Telephone Encounter (Signed)
office resceived ref from [facility] to sch pt.

## 2015-06-13 ENCOUNTER — Ambulatory Visit: Payer: Medicare Other | Admitting: Family Medicine

## 2015-06-14 ENCOUNTER — Encounter: Payer: Self-pay | Admitting: Family Medicine

## 2015-06-14 DIAGNOSIS — G5603 Carpal tunnel syndrome, bilateral upper limbs: Secondary | ICD-10-CM | POA: Diagnosis not present

## 2015-06-14 DIAGNOSIS — Z4789 Encounter for other orthopedic aftercare: Secondary | ICD-10-CM | POA: Diagnosis not present

## 2015-06-15 ENCOUNTER — Other Ambulatory Visit: Payer: Self-pay | Admitting: Family Medicine

## 2015-06-19 ENCOUNTER — Other Ambulatory Visit: Payer: Self-pay | Admitting: Family Medicine

## 2015-06-20 ENCOUNTER — Telehealth: Payer: Self-pay

## 2015-06-20 DIAGNOSIS — M2170 Unequal limb length (acquired), unspecified site: Secondary | ICD-10-CM

## 2015-06-20 NOTE — Telephone Encounter (Signed)
Patient said she was suppose to be referred to someone for a lift in her shoe

## 2015-06-20 NOTE — Telephone Encounter (Signed)
She needs an orthotics provider. Not sure how to write for that. Can you help?

## 2015-06-22 NOTE — Telephone Encounter (Signed)
[facility] does orthotics so you can put in as Ortho and in comments put for orthotics

## 2015-06-23 NOTE — Addendum Note (Signed)
Addended by: Claretta Fraise on: 06/23/2015 08:06 AM   Modules accepted: Orders

## 2015-06-29 ENCOUNTER — Telehealth: Payer: Self-pay

## 2015-06-29 NOTE — Telephone Encounter (Signed)
Called patient about orthotic RX that was sent over and told patient how much they would be and she said she couldn't pay that.   Tried to give her [employer] number but she said she was too tired to write it down

## 2015-07-02 ENCOUNTER — Emergency Department (HOSPITAL_COMMUNITY): Payer: Medicare Other

## 2015-07-02 ENCOUNTER — Emergency Department (HOSPITAL_COMMUNITY)
Admission: EM | Admit: 2015-07-02 | Discharge: 2015-07-02 | Disposition: A | Discharge status: Home / Self Care | Payer: Medicare Other | Attending: Emergency Medicine | Admitting: Emergency Medicine

## 2015-07-02 ENCOUNTER — Encounter (HOSPITAL_COMMUNITY): Payer: Self-pay | Admitting: Emergency Medicine

## 2015-07-02 DIAGNOSIS — F329 Major depressive disorder, single episode, unspecified: Secondary | ICD-10-CM | POA: Insufficient documentation

## 2015-07-02 DIAGNOSIS — F17210 Nicotine dependence, cigarettes, uncomplicated: Secondary | ICD-10-CM | POA: Diagnosis not present

## 2015-07-02 DIAGNOSIS — J45909 Unspecified asthma, uncomplicated: Secondary | ICD-10-CM | POA: Diagnosis not present

## 2015-07-02 DIAGNOSIS — R0602 Shortness of breath: Secondary | ICD-10-CM | POA: Diagnosis present

## 2015-07-02 DIAGNOSIS — M1990 Unspecified osteoarthritis, unspecified site: Secondary | ICD-10-CM | POA: Diagnosis not present

## 2015-07-02 DIAGNOSIS — J45901 Unspecified asthma with (acute) exacerbation: Secondary | ICD-10-CM

## 2015-07-02 DIAGNOSIS — Z79899 Other long term (current) drug therapy: Secondary | ICD-10-CM | POA: Insufficient documentation

## 2015-07-02 MED ORDER — PREDNISONE 10 MG PO TABS: 20.0000 mg | ORAL_TABLET | Freq: Every day | ORAL | Status: DC

## 2015-07-02 MED ORDER — PREDNISONE 50 MG PO TABS
60.0000 mg | ORAL_TABLET | Freq: Once | ORAL | Status: AC
  Administered 2015-07-02: 60 mg via ORAL
  Filled 2015-07-02: qty 1

## 2015-07-02 MED ORDER — ALBUTEROL SULFATE HFA 108 (90 BASE) MCG/ACT IN AERS: 1.0000 | INHALATION_SPRAY | Freq: Four times a day (QID) | RESPIRATORY_TRACT | Status: DC | PRN

## 2015-07-02 MED ORDER — IPRATROPIUM-ALBUTEROL 0.5-2.5 (3) MG/3ML IN SOLN
3.0000 mL | Freq: Once | RESPIRATORY_TRACT | Status: AC
  Administered 2015-07-02: 3 mL via RESPIRATORY_TRACT
  Filled 2015-07-02: qty 3

## 2015-07-02 MED ORDER — ALBUTEROL SULFATE (2.5 MG/3ML) 0.083% IN NEBU
2.5000 mg | INHALATION_SOLUTION | Freq: Once | RESPIRATORY_TRACT | Status: AC
  Administered 2015-07-02: 2.5 mg via RESPIRATORY_TRACT
  Filled 2015-07-02: qty 3
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
## 2015-07-02 NOTE — Discharge Instructions (Signed)
Prescription for prednisone and Proventil. Follow-up your primary care doctor.

## 2015-07-02 NOTE — ED Notes (Signed)
Pt states she had an asthma attack yesterday and has been using her inhaler every few hours since with little relief.

## 2015-07-03 ENCOUNTER — Telehealth: Payer: Self-pay

## 2015-07-03 DIAGNOSIS — J45902 Unspecified asthma with status asthmaticus: Secondary | ICD-10-CM

## 2015-07-03 NOTE — Telephone Encounter (Signed)
Referral ordered

## 2015-07-03 NOTE — Telephone Encounter (Signed)
Please refer as requested 

## 2015-07-03 NOTE — Telephone Encounter (Signed)
I want a referral to Dr Luan Pulling pulmonary in [place]

## 2015-07-20 ENCOUNTER — Ambulatory Visit (HOSPITAL_COMMUNITY): Payer: Self-pay | Admitting: Psychiatry

## 2015-07-25 ENCOUNTER — Encounter: Payer: Self-pay | Admitting: Family Medicine

## 2015-07-25 ENCOUNTER — Other Ambulatory Visit: Payer: Self-pay | Admitting: Family Medicine

## 2015-07-25 ENCOUNTER — Ambulatory Visit (INDEPENDENT_AMBULATORY_CARE_PROVIDER_SITE_OTHER): Payer: Medicare Other | Admitting: Family Medicine

## 2015-07-25 VITALS — BP 109/73 | HR 90 | Temp 97.7°F | Ht 69.0 in | Wt 210.6 lb

## 2015-07-25 DIAGNOSIS — J4530 Mild persistent asthma, uncomplicated: Secondary | ICD-10-CM

## 2015-07-25 DIAGNOSIS — F329 Major depressive disorder, single episode, unspecified: Secondary | ICD-10-CM

## 2015-07-25 DIAGNOSIS — F419 Anxiety disorder, unspecified: Secondary | ICD-10-CM | POA: Diagnosis not present

## 2015-07-25 DIAGNOSIS — N951 Menopausal and female climacteric states: Secondary | ICD-10-CM

## 2015-07-25 DIAGNOSIS — R232 Flushing: Secondary | ICD-10-CM

## 2015-07-25 DIAGNOSIS — F32A Depression, unspecified: Secondary | ICD-10-CM

## 2015-07-25 MED ORDER — ALBUTEROL SULFATE HFA 108 (90 BASE) MCG/ACT IN AERS: 1.0000 | INHALATION_SPRAY | Freq: Four times a day (QID) | RESPIRATORY_TRACT | Status: DC | PRN

## 2015-07-25 MED ORDER — LAMOTRIGINE 100 MG PO TABS: ORAL_TABLET | ORAL | Status: DC

## 2015-07-25 MED ORDER — ALPRAZOLAM 0.5 MG PO TABS
0.5000 mg | ORAL_TABLET | Freq: Three times a day (TID) | ORAL | Status: DC
Start: 2015-07-25 — End: 2015-10-03

## 2015-07-25 MED ORDER — BUDESONIDE-FORMOTEROL FUMARATE 80-4.5 MCG/ACT IN AERO: 2.0000 | INHALATION_SPRAY | Freq: Two times a day (BID) | RESPIRATORY_TRACT | Status: DC

## 2015-07-25 NOTE — Addendum Note (Signed)
Addended by: Jamelle Haring on: 07/25/2015 03:39 PM   Modules accepted: Orders
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
## 2015-07-26 LAB — FSH/LH
FSH: 6.4 m[IU]/mL
LH: 7.3 m[IU]/mL

## 2015-08-21 ENCOUNTER — Other Ambulatory Visit (HOSPITAL_COMMUNITY): Payer: Self-pay | Admitting: Respiratory Therapy

## 2015-08-21 DIAGNOSIS — F319 Bipolar disorder, unspecified: Secondary | ICD-10-CM | POA: Diagnosis not present

## 2015-08-21 DIAGNOSIS — F17200 Nicotine dependence, unspecified, uncomplicated: Secondary | ICD-10-CM | POA: Diagnosis not present

## 2015-08-21 DIAGNOSIS — J45909 Unspecified asthma, uncomplicated: Secondary | ICD-10-CM

## 2015-08-21 DIAGNOSIS — J4540 Moderate persistent asthma, uncomplicated: Secondary | ICD-10-CM | POA: Diagnosis not present

## 2015-08-27 ENCOUNTER — Other Ambulatory Visit: Payer: Self-pay | Admitting: Family Medicine

## 2015-08-30 ENCOUNTER — Telehealth: Payer: Self-pay | Admitting: Nurse Practitioner

## 2015-08-30 NOTE — Telephone Encounter (Signed)
Pt called wanting to discuss starting Execlon to help with focusing and memory. She said her insurance should pay for it. An appt was scheduled with Dr Jaynee Eagles 8/9 @ 4:00 but she is not wanting to wait until then to discuss this medication. Pls call

## 2015-08-30 NOTE — Telephone Encounter (Signed)
I called pt.  She is wanting to start on exelon, for her memory.  She is not taking anything presently for memory. She did not have sleep test done (I will follow up on this).  She is still having daytime sleepiness. Tired all the time.    We will check with Dr. Jaynee Eagles on her return early next week and see if wants to start on other med prior to her 09-20-15 appt.  She has not seen psychiatry as recommended.

## 2015-08-31 ENCOUNTER — Other Ambulatory Visit: Payer: Self-pay | Admitting: *Deleted

## 2015-08-31 DIAGNOSIS — R0683 Snoring: Secondary | ICD-10-CM

## 2015-08-31 DIAGNOSIS — G4719 Other hypersomnia: Secondary | ICD-10-CM

## 2015-08-31 NOTE — Telephone Encounter (Signed)
I spoke with sleep lab.  Will re order.  Done.

## 2015-08-31 NOTE — Progress Notes (Signed)
See previous phone note.  Per pt will f/u on sleep study.

## 2015-08-31 NOTE — Telephone Encounter (Signed)
I never received a referral.  Vaughan Basta

## 2015-09-01 ENCOUNTER — Ambulatory Visit (HOSPITAL_COMMUNITY)
Admission: RE | Admit: 2015-09-01 | Discharge: 2015-09-01 | Disposition: A | Discharge status: Home / Self Care | Payer: Medicare Other | Source: Ambulatory Visit | Attending: Pulmonary Disease | Admitting: Pulmonary Disease

## 2015-09-01 ENCOUNTER — Encounter (HOSPITAL_COMMUNITY): Payer: Self-pay

## 2015-09-01 DIAGNOSIS — J45909 Unspecified asthma, uncomplicated: Secondary | ICD-10-CM | POA: Insufficient documentation

## 2015-09-01 MED ORDER — ALBUTEROL SULFATE (2.5 MG/3ML) 0.083% IN NEBU
2.5000 mg | INHALATION_SOLUTION | Freq: Once | RESPIRATORY_TRACT | Status: AC
  Administered 2015-09-01: 2.5 mg via RESPIRATORY_TRACT

## 2015-09-02 LAB — PULMONARY FUNCTION TEST
DL/VA % pred: 60 %
DL/VA: 3.1 ml/min/mmHg/L
DLCO UNC % PRED: 53 %
DLCO unc: 15.06 ml/min/mmHg
FEF 25-75 POST: 2.27 L/s
FEF 25-75 PRE: 1.96 L/s
FEF2575-%Change-Post: 16 %
FEF2575-%PRED-POST: 68 %
FEF2575-%PRED-PRE: 58 %
FEV1-%CHANGE-POST: 4 %
FEV1-%Pred-Post: 79 %
FEV1-%Pred-Pre: 76 %
FEV1-Post: 2.64 L
FEV1-Pre: 2.52 L
FEV1FVC-%Change-Post: 1 %
FEV1FVC-%PRED-PRE: 90 %
FEV6-%CHANGE-POST: 3 %
FEV6-%PRED-POST: 88 %
FEV6-%Pred-Pre: 85 %
FEV6-Post: 3.53 L
FEV6-Pre: 3.42 L
FEV6FVC-%Pred-Post: 102 %
FEV6FVC-%Pred-Pre: 102 %
FVC-%CHANGE-POST: 3 %
FVC-%PRED-POST: 86 %
FVC-%Pred-Pre: 83 %
FVC-Post: 3.53 L
FVC-Pre: 3.42 L
POST FEV1/FVC RATIO: 75 %
PRE FEV1/FVC RATIO: 74 %
Post FEV6/FVC ratio: 100 %
Pre FEV6/FVC Ratio: 100 %
RV % pred: 147 %
RV: 2.55 L
TLC % pred: 106 %
TLC: 5.87 L

## 2015-09-05 ENCOUNTER — Ambulatory Visit (INDEPENDENT_AMBULATORY_CARE_PROVIDER_SITE_OTHER): Payer: Medicare Other | Admitting: Family Medicine

## 2015-09-05 ENCOUNTER — Encounter: Payer: Self-pay | Admitting: Family Medicine

## 2015-09-05 VITALS — BP 109/68 | HR 76 | Temp 97.7°F | Ht 69.0 in | Wt 208.8 lb

## 2015-09-05 DIAGNOSIS — F329 Major depressive disorder, single episode, unspecified: Secondary | ICD-10-CM | POA: Diagnosis not present

## 2015-09-05 DIAGNOSIS — S069X5D Unspecified intracranial injury with loss of consciousness greater than 24 hours with return to pre-existing conscious level, subsequent encounter: Secondary | ICD-10-CM

## 2015-09-05 DIAGNOSIS — F419 Anxiety disorder, unspecified: Secondary | ICD-10-CM

## 2015-09-05 DIAGNOSIS — F32A Depression, unspecified: Secondary | ICD-10-CM
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
## 2015-09-06 ENCOUNTER — Telehealth: Payer: Self-pay | Admitting: Family Medicine

## 2015-09-13 ENCOUNTER — Institutional Professional Consult (permissible substitution): Payer: Medicare Other | Admitting: Neurology

## 2015-09-20 ENCOUNTER — Ambulatory Visit (HOSPITAL_COMMUNITY): Payer: Self-pay | Admitting: Psychiatry

## 2015-09-20 ENCOUNTER — Encounter: Payer: Self-pay | Admitting: Neurology

## 2015-09-20 ENCOUNTER — Ambulatory Visit (INDEPENDENT_AMBULATORY_CARE_PROVIDER_SITE_OTHER): Payer: Medicare Other | Admitting: Neurology

## 2015-09-20 VITALS — BP 113/71 | HR 77 | Ht 69.0 in | Wt 208.0 lb

## 2015-09-20 DIAGNOSIS — R404 Transient alteration of awareness: Secondary | ICD-10-CM

## 2015-09-20 NOTE — Patient Instructions (Signed)
Our phone number is [phone]. We also have an after hours call service for urgent matters and there is a physician on-call for urgent questions. For any emergencies you know to call 911 or go to the nearest emergency room   
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
## 2015-09-24 DIAGNOSIS — R404 Transient alteration of awareness: Secondary | ICD-10-CM

## 2015-09-24 HISTORY — DX: Transient alteration of awareness: R40.4

## 2015-09-28 ENCOUNTER — Other Ambulatory Visit: Payer: Self-pay | Admitting: Family Medicine

## 2015-10-03 ENCOUNTER — Other Ambulatory Visit: Payer: Self-pay | Admitting: Family Medicine

## 2015-10-03 NOTE — Telephone Encounter (Signed)
Last written 07/25/15. If approved route to nurse to call in to [facility] in [place]

## 2015-10-11 ENCOUNTER — Institutional Professional Consult (permissible substitution): Payer: Medicare Other | Admitting: Neurology

## 2015-10-11 ENCOUNTER — Telehealth: Payer: Self-pay

## 2015-10-11 NOTE — Telephone Encounter (Signed)
Patient did not show to new sleep consult.

## 2015-10-12 ENCOUNTER — Ambulatory Visit: Payer: Medicare Other | Admitting: Family Medicine

## 2015-10-12 NOTE — Progress Notes (Deleted)
   Subjective:    Patient ID: Shelby [name], female    DOB: 05/19/1975, 40 y.o.   MRN: [id number]  HPI    Review of Systems     Objective:   Physical Exam        Assessment & Plan:

## 2015-10-17 ENCOUNTER — Encounter: Payer: Self-pay | Admitting: Neurology

## 2015-10-20 ENCOUNTER — Other Ambulatory Visit: Payer: Self-pay | Admitting: Family Medicine

## 2015-10-25 ENCOUNTER — Ambulatory Visit (INDEPENDENT_AMBULATORY_CARE_PROVIDER_SITE_OTHER): Payer: Medicare Other | Admitting: Family Medicine

## 2015-10-25 ENCOUNTER — Encounter: Payer: Self-pay | Admitting: Family Medicine

## 2015-10-25 VITALS — BP 127/77 | HR 81 | Temp 97.8°F | Ht 69.0 in | Wt 205.4 lb

## 2015-10-25 DIAGNOSIS — F32A Depression, unspecified: Secondary | ICD-10-CM

## 2015-10-25 DIAGNOSIS — F419 Anxiety disorder, unspecified: Secondary | ICD-10-CM

## 2015-10-25 DIAGNOSIS — S069X5D Unspecified intracranial injury with loss of consciousness greater than 24 hours with return to pre-existing conscious level, subsequent encounter: Secondary | ICD-10-CM | POA: Diagnosis not present

## 2015-10-25 DIAGNOSIS — F329 Major depressive disorder, single episode, unspecified: Secondary | ICD-10-CM | POA: Diagnosis not present

## 2015-10-25 MED ORDER — ALPRAZOLAM 0.5 MG PO TABS
0.5000 mg | ORAL_TABLET | Freq: Three times a day (TID) | ORAL | 5 refills | Status: DC
Start: 2015-10-25 — End: 2016-04-10
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
## 2015-11-25 ENCOUNTER — Emergency Department (HOSPITAL_COMMUNITY)
Admission: EM | Admit: 2015-11-25 | Discharge: 2015-11-25 | Disposition: A | Discharge status: Home / Self Care | Payer: Medicare Other | Attending: Emergency Medicine | Admitting: Emergency Medicine

## 2015-11-25 ENCOUNTER — Emergency Department (HOSPITAL_COMMUNITY): Payer: Medicare Other

## 2015-11-25 ENCOUNTER — Encounter (HOSPITAL_COMMUNITY): Payer: Self-pay | Admitting: *Deleted

## 2015-11-25 DIAGNOSIS — S6992XA Unspecified injury of left wrist, hand and finger(s), initial encounter: Secondary | ICD-10-CM | POA: Diagnosis not present

## 2015-11-25 DIAGNOSIS — S6000XA Contusion of unspecified finger without damage to nail, initial encounter: Secondary | ICD-10-CM

## 2015-11-25 DIAGNOSIS — Y999 Unspecified external cause status: Secondary | ICD-10-CM | POA: Diagnosis not present

## 2015-11-25 DIAGNOSIS — W208XXA Other cause of strike by thrown, projected or falling object, initial encounter: Secondary | ICD-10-CM | POA: Insufficient documentation

## 2015-11-25 DIAGNOSIS — F17210 Nicotine dependence, cigarettes, uncomplicated: Secondary | ICD-10-CM | POA: Diagnosis not present

## 2015-11-25 DIAGNOSIS — Y92018 Other place in single-family (private) house as the place of occurrence of the external cause: Secondary | ICD-10-CM | POA: Diagnosis not present

## 2015-11-25 DIAGNOSIS — S60032A Contusion of left middle finger without damage to nail, initial encounter: Secondary | ICD-10-CM | POA: Insufficient documentation

## 2015-11-25 DIAGNOSIS — J45909 Unspecified asthma, uncomplicated: Secondary | ICD-10-CM | POA: Insufficient documentation

## 2015-11-25 DIAGNOSIS — Y9389 Activity, other specified: Secondary | ICD-10-CM | POA: Diagnosis not present

## 2015-11-25 DIAGNOSIS — S60022A Contusion of left index finger without damage to nail, initial encounter: Secondary | ICD-10-CM | POA: Insufficient documentation

## 2015-11-25 MED ORDER — HYDROCODONE-ACETAMINOPHEN 5-325 MG PO TABS: 1.0000 | ORAL_TABLET | ORAL | 0 refills | Status: DC | PRN

## 2015-11-25 NOTE — ED Notes (Signed)
Pt alert & oriented x4, stable gait. Patient given discharge instructions, paperwork & prescription(s). Patient informed not to drive, operate any equipment & handel any important documents 4 hours after taking pain medication. Patient  instructed to stop at the registration desk to finish any additional paperwork. Patient  verbalized understanding. Pt left department w/ no further questions. 

## 2015-11-25 NOTE — Discharge Instructions (Signed)
The x-ray of your left hand is negative for fracture or dislocation. Your examination suggest contusion to the index finger and middle finger of the left hand. Please keep your hand elevated above your heart is much as possible. Apply ice. Use Tylenol or ibuprofen for mild pain. Use Norco for more severe pain.
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
## 2015-11-25 NOTE — ED Notes (Signed)
Cushion wrap applied to middle & index finger.
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
## 2015-11-25 NOTE — ED Notes (Signed)
Patient transported to X-ray 

## 2015-11-25 NOTE — ED Provider Notes (Signed)
[facility] DEPT Provider Note   CSN: [id number] Arrival date & time: 11/25/15  1712     History   Chief Complaint Chief Complaint  Patient presents with  . Finger Injury    HPI IMPI FICHTNER is a 40 y.o. female.  Patient is a 40 year old female who presents to the emergency department with a complaint of finger injury.  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 She sustained injury to the left index finger and the left middle finger. She noticed a blood blister to the bottom of the middle finger almost immediately. She complains of throbbing type pain that moves up into her hand. No other injury reported at this time. The patient denies being on any anticoagulation medications. There's been no previous operations or procedures involving the left upper extremity.   The history is provided by the patient.    Past Medical History:  Diagnosis Date  . Anxiety   . Arthritis   . Asthma   . Bad memory    from [place] brain injury 2001  . Depression    Patient does not have a problem with depression.  . Migraines   . Traumatic brain injury [facility]) 2001   [facility]    Patient Active Problem List   Diagnosis Date Noted  . Transient alteration of awareness 09/24/2015  . Carpal tunnel syndrome 02/09/2015  . 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 Asthma   . Depression   . Anxiety   . Migraines   . Arthritis   . Memory loss 05/18/2012  . Traumatic brain injury ([place]) 05/18/2012  . Constipation 07/26/2011    Past Surgical History:  Procedure Laterality Date  . CESAREAN SECTION     x2  . cyst remove     x3 from wrist-ganglion  . INCISIONAL HERNIA REPAIR  12/27/2011   Procedure: HERNIA REPAIR INCISIONAL;  Surgeon: Jamesetta So, MD;  Location: [facility];  Service: General;  Laterality: N/A;  . INSERTION OF MESH  12/27/2011   Procedure: INSERTION OF MESH;  Surgeon: Jamesetta So, MD;   Location: [facility];  Service: General;  Laterality: N/A;  . TUBAL LIGATION    . UMBILICAL HERNIA REPAIR  09/20/2011   Procedure: HERNIA REPAIR UMBILICAL ADULT;  Surgeon: Jamesetta So, MD;  Location: [facility];  Service: General;  Laterality: N/A;  . uterine ablation    . WOUND DEBRIDEMENT  12/27/2011   Procedure: DEBRIDEMENT ABDOMINAL WOUND;  Surgeon: Jamesetta So, MD;  Location: [facility];  Service: General;  Laterality: N/A;    OB History    Gravida Para Term Preterm AB Living   2         2   SAB TAB Ectopic Multiple Live Births                   Home Medications    Prior to Admission medications   Medication Sig Start Date End Date Taking? Authorizing Provider  albuterol (PROVENTIL HFA;VENTOLIN HFA) 108 (90 Base) MCG/ACT inhaler Inhale 1-2 puffs into the lungs every 6 (six) hours as needed for wheezing or shortness of breath. 07/25/15  Yes Wardell Honour, MD  ALPRAZolam Duanne Moron) 0.5 MG tablet Take 1 tablet (0.5 mg total) by mouth 3 (three) times daily. Patient taking differently: Take 0.5 mg by mouth 3 (three) times daily as needed for anxiety.  10/25/15  Yes Claretta Fraise, MD  DULoxetine (CYMBALTA) 30 MG  capsule TABLET 3 CAPSULES BY MOUTH DAILY AS ONE DOSE ON A FULL STOMACH 10/20/15  Yes Claretta Fraise, MD  gabapentin (NEURONTIN) 300 MG capsule TAKE 1 CAPSULE(300 MG) BY MOUTH AT BEDTIME 09/28/15  Yes Wardell Honour, MD  medroxyPROGESTERone (PROVERA) 5 MG tablet TAKE 1 TABLET(5 MG) BY MOUTH DAILY 08/29/15  Yes Wardell Honour, MD  budesonide-formoterol [facility]) 80-4.5 MCG/ACT inhaler Inhale 2 puffs into the lungs 2 (two) times daily. Patient not taking: Reported on 11/25/2015 07/25/15   Wardell Honour, MD    Family History Family History  Problem Relation Age of Onset  . Diabetes Father   . Colon cancer Paternal Grandfather     Social History Social History  Substance Use Topics  . Smoking status: Current Every Day Smoker    Packs/day: 1.00    Years: 10.00    Types:  Cigarettes  . Smokeless tobacco: Never Used  . Alcohol use No     Allergies   Chantix [varenicline]; Penicillins; and Latex   Review of Systems Review of Systems  Constitutional: Negative for activity change.       All ROS Neg except as noted in HPI  HENT: Negative for nosebleeds.   Eyes: Negative for photophobia and discharge.  Respiratory: Negative for cough, shortness of breath and wheezing.   Cardiovascular: Negative for chest pain and palpitations.  Gastrointestinal: Negative for abdominal pain and blood in stool.  Genitourinary: Negative for dysuria, frequency and hematuria.  Musculoskeletal: Negative for arthralgias, back pain and neck pain.  Skin: Negative.   Neurological: Negative for dizziness, seizures and speech difficulty.  Psychiatric/Behavioral: Negative for confusion and hallucinations. The patient is nervous/anxious.      Physical Exam Updated Vital Signs BP 132/78 (BP Location: Right Arm)   Pulse 67   Temp 98 F (36.7 C) (Oral)   Resp 16   Ht 5' 8.5" (1.74 m)   Wt 94.3 kg   SpO2 98%   BMI 31.17 kg/m   Physical Exam  Constitutional: She is oriented to person, place, and time. She appears well-developed and well-nourished.  Non-toxic appearance.  HENT:  Head: Normocephalic.  Right Ear: Tympanic membrane and external ear normal.  Left Ear: Tympanic membrane and external ear normal.  Eyes: EOM and lids are normal. Pupils are equal, round, and reactive to light.  Neck: Normal range of motion. Neck supple. Carotid bruit is not present.  Cardiovascular: Normal rate, regular rhythm, normal heart sounds, intact distal pulses and normal pulses.   Pulmonary/Chest: Breath sounds normal. No respiratory distress.  Abdominal: Soft. Bowel sounds are normal. There is no tenderness. There is no guarding.  Musculoskeletal: She exhibits tenderness.  There is good range of motion of the fingers of the left hand, but significant pain with movement of the index finger  and the left middle finger. There is a blood blister on the palmar surface of the middle finger. No subungual hematoma. Capillary refill is less than 2 seconds. The radial pulses 2+. There is full range of motion of the left wrist, elbow, and shoulder.  Lymphadenopathy:       Head (right side): No submandibular adenopathy present.       Head (left side): No submandibular adenopathy present.    She has no cervical adenopathy.  Neurological: She is alert and oriented to person, place, and time. She has normal strength. No cranial nerve deficit or sensory deficit.  Skin: Skin is warm and dry.  Psychiatric: Her speech is normal. Her mood appears anxious.  Nursing note and vitals reviewed.    ED Treatments / Results  Labs (all labs ordered are listed, but only abnormal results are displayed) Labs Reviewed - No data to display  EKG  EKG Interpretation None       Radiology No results found.  Procedures Procedures (including critical care time)  Medications Ordered in ED Medications - No data to display   Initial Impression / Assessment and Plan / ED Course  I have reviewed the triage vital signs and the nursing notes.  Pertinent labs & imaging results that were available during my care of the patient were reviewed by me and considered in my medical decision making (see chart for details).  Clinical Course    *I have reviewed nursing notes, vital signs, and all appropriate lab and imaging results for this patient.**  Final Clinical Impressions(s) / ED Diagnoses  X-ray of the left hand is negative for fracture or dislocation. The examination suggest contusion to the left index and middle finger. The patient is fitted with a Colles splint, and provided with an ice pack. A prescription for norco given to the patient.   Final diagnoses:  None    New Prescriptions New Prescriptions   No medications on file     Lily Kocher, PA-C 11/25/15 [place],  PA-C 11/25/15 [place], DO 11/28/15 [phone]

## 2016-01-03 DIAGNOSIS — G5602 Carpal tunnel syndrome, left upper limb: Secondary | ICD-10-CM | POA: Diagnosis not present

## 2016-01-08 ENCOUNTER — Emergency Department (HOSPITAL_COMMUNITY)
Admission: EM | Admit: 2016-01-08 | Discharge: 2016-01-08 | Disposition: A | Discharge status: Home / Self Care | Payer: Worker's Compensation | Attending: Emergency Medicine | Admitting: Emergency Medicine

## 2016-01-08 ENCOUNTER — Encounter (HOSPITAL_COMMUNITY): Payer: Self-pay | Admitting: Emergency Medicine

## 2016-01-08 DIAGNOSIS — N3001 Acute cystitis with hematuria: Secondary | ICD-10-CM | POA: Diagnosis not present

## 2016-01-08 DIAGNOSIS — Z91040 Latex allergy status: Secondary | ICD-10-CM | POA: Diagnosis not present

## 2016-01-08 DIAGNOSIS — S29012A Strain of muscle and tendon of back wall of thorax, initial encounter: Secondary | ICD-10-CM | POA: Diagnosis not present

## 2016-01-08 DIAGNOSIS — Y990 Civilian activity done for income or pay: Secondary | ICD-10-CM | POA: Diagnosis not present

## 2016-01-08 DIAGNOSIS — Z79899 Other long term (current) drug therapy: Secondary | ICD-10-CM | POA: Diagnosis not present

## 2016-01-08 DIAGNOSIS — F17210 Nicotine dependence, cigarettes, uncomplicated: Secondary | ICD-10-CM | POA: Insufficient documentation

## 2016-01-08 DIAGNOSIS — X509XXA Other and unspecified overexertion or strenuous movements or postures, initial encounter: Secondary | ICD-10-CM | POA: Insufficient documentation

## 2016-01-08 DIAGNOSIS — Y929 Unspecified place or not applicable: Secondary | ICD-10-CM | POA: Diagnosis not present

## 2016-01-08 DIAGNOSIS — Y9389 Activity, other specified: Secondary | ICD-10-CM | POA: Insufficient documentation

## 2016-01-08 DIAGNOSIS — J45909 Unspecified asthma, uncomplicated: Secondary | ICD-10-CM | POA: Insufficient documentation

## 2016-01-08 DIAGNOSIS — T148XXA Other injury of unspecified body region, initial encounter: Secondary | ICD-10-CM

## 2016-01-08 DIAGNOSIS — S299XXA Unspecified injury of thorax, initial encounter: Secondary | ICD-10-CM | POA: Diagnosis present

## 2016-01-08 LAB — URINALYSIS, ROUTINE W REFLEX MICROSCOPIC
BILIRUBIN URINE: NEGATIVE
GLUCOSE, UA: NEGATIVE mg/dL
KETONES UR: NEGATIVE mg/dL
Leukocytes, UA: NEGATIVE
Nitrite: POSITIVE — AB
PROTEIN: NEGATIVE mg/dL
Specific Gravity, Urine: <1.005 — ABNORMAL LOW (ref 1.005–1.030)
pH: 6 (ref 5.0–8.0)

## 2016-01-08 LAB — URINE MICROSCOPIC-ADD ON

## 2016-01-08 LAB — POC URINE PREG, ED: PREG TEST UR: NEGATIVE

## 2016-01-08 MED ORDER — SULFAMETHOXAZOLE-TRIMETHOPRIM 800-160 MG PO TABS: 1.0000 | ORAL_TABLET | Freq: Two times a day (BID) | ORAL | 0 refills | Status: AC

## 2016-01-08 MED ORDER — CYCLOBENZAPRINE HCL 10 MG PO TABS: 10.0000 mg | ORAL_TABLET | Freq: Three times a day (TID) | ORAL | 0 refills | Status: DC | PRN

## 2016-01-08 MED ORDER — IBUPROFEN 800 MG PO TABS
800.0000 mg | ORAL_TABLET | Freq: Once | ORAL | Status: AC
  Administered 2016-01-08: 800 mg via ORAL
  Filled 2016-01-08: qty 1

## 2016-01-08 MED ORDER — IBUPROFEN 600 MG PO TABS: 600.0000 mg | ORAL_TABLET | Freq: Four times a day (QID) | ORAL | 0 refills | Status: DC | PRN

## 2016-01-08 NOTE — ED Provider Notes (Signed)
[facility] DEPT Provider Note   CSN: [id number] Arrival date & time: 01/08/16  0039     History   Chief Complaint Chief Complaint  Patient presents with  . Back Pain    HPI Shelby [name] is a 40 y.o. female.  The history is provided by the patient.  Back Pain   This is a new problem. The current episode started 2 days ago. The problem occurs constantly. The problem has been gradually worsening. Associated with: pulling a pallet at work. The pain is present in the thoracic spine. The pain is moderate. The symptoms are aggravated by bending, twisting and certain positions. Pertinent negatives include no chest pain, no fever, no numbness, no abdominal pain, no bladder incontinence and no weakness. She has tried heat for the symptoms. The treatment provided no relief.  pt has had thoracic back pain since pulling a pallet at work ([employer]) 2 days ago No falls/trauma No focal extremity weakness It is not improving with home remedies   Past Medical History:  Diagnosis Date  . Anxiety   . Arthritis   . Asthma   . Bad memory    from [place] brain injury 2001  . Depression    Patient does not have a problem with depression.  . Migraines   . Traumatic brain injury [facility]) 2001   [facility]    Patient Active Problem List   Diagnosis Date Noted  . Transient alteration of awareness 09/24/2015  . Carpal tunnel syndrome 02/09/2015  . 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 Asthma   . Depression   . Anxiety   . Migraines   . Arthritis   . Memory loss 05/18/2012  . Traumatic brain injury ([place]) 05/18/2012  . Constipation 07/26/2011    Past Surgical History:  Procedure Laterality Date  . CESAREAN SECTION     x2  . cyst remove     x3 from wrist-ganglion  . INCISIONAL HERNIA REPAIR  12/27/2011   Procedure: HERNIA REPAIR INCISIONAL;  Surgeon: Jamesetta So, MD;  Location: [facility];  Service: General;  Laterality: N/A;  . INSERTION OF MESH   12/27/2011   Procedure: INSERTION OF MESH;  Surgeon: Jamesetta So, MD;  Location: [facility];  Service: General;  Laterality: N/A;  . TUBAL LIGATION    . UMBILICAL HERNIA REPAIR  09/20/2011   Procedure: HERNIA REPAIR UMBILICAL ADULT;  Surgeon: Jamesetta So, MD;  Location: [facility];  Service: General;  Laterality: N/A;  . uterine ablation    . WOUND DEBRIDEMENT  12/27/2011   Procedure: DEBRIDEMENT ABDOMINAL WOUND;  Surgeon: Jamesetta So, MD;  Location: [facility];  Service: General;  Laterality: N/A;    OB History    Gravida Para Term Preterm AB Living   2         2   SAB TAB Ectopic Multiple Live Births                   Home Medications    Prior to Admission medications   Medication Sig Start Date End Date Taking? Authorizing Provider  albuterol (PROVENTIL HFA;VENTOLIN HFA) 108 (90 Base) MCG/ACT inhaler Inhale 1-2 puffs into the lungs every 6 (six) hours as needed for wheezing or shortness of breath. 07/25/15  Yes Wardell Honour, MD  ALPRAZolam Duanne Moron) 0.5 MG tablet Take 1 tablet (0.5 mg total) by mouth 3 (three) times daily. Patient taking differently: Take 0.5 mg by mouth 3 (three) times daily as needed for anxiety.  10/25/15  Yes Claretta Fraise, MD  budesonide-formoterol [facility]) 80-4.5 MCG/ACT inhaler Inhale 2 puffs into the lungs 2 (two) times daily. 07/25/15  Yes Wardell Honour, MD  DULoxetine (CYMBALTA) 30 MG capsule TABLET 3 CAPSULES BY MOUTH DAILY AS ONE DOSE ON A FULL STOMACH 10/20/15  Yes Claretta Fraise, MD  gabapentin (NEURONTIN) 300 MG capsule TAKE 1 CAPSULE(300 MG) BY MOUTH AT BEDTIME 09/28/15  Yes Wardell Honour, MD  HYDROcodone-acetaminophen (NORCO/VICODIN) 5-325 MG tablet Take 1 tablet by mouth every 4 (four) hours as needed. 11/25/15  Yes Lily Kocher, PA-C  medroxyPROGESTERone (PROVERA) 5 MG tablet TAKE 1 TABLET(5 MG) BY MOUTH DAILY 08/29/15  Yes Wardell Honour, MD  cyclobenzaprine (FLEXERIL) 10 MG tablet Take 1 tablet (10 mg total) by mouth 3 (three) times daily as  needed for muscle spasms. 01/08/16   Ripley Fraise, MD  ibuprofen (ADVIL,MOTRIN) 600 MG tablet Take 1 tablet (600 mg total) by mouth every 6 (six) hours as needed. 01/08/16   Ripley Fraise, MD  sulfamethoxazole-trimethoprim (BACTRIM DS,SEPTRA DS) 800-160 MG tablet Take 1 tablet by mouth 2 (two) times daily. 01/08/16 01/15/16  Ripley Fraise, MD    Family History Family History  Problem Relation Age of Onset  . Diabetes Father   . Colon cancer Paternal Grandfather     Social History Social History  Substance Use Topics  . Smoking status: Current Every Day Smoker    Packs/day: 1.00    Years: 10.00    Types: Cigarettes  . Smokeless tobacco: Never Used  . Alcohol use No     Allergies   Chantix [varenicline]; Penicillins; and Latex   Review of Systems Review of Systems  Constitutional: Negative for fever.  Cardiovascular: Negative for chest pain.  Gastrointestinal: Negative for abdominal pain.  Genitourinary: Negative for bladder incontinence.  Musculoskeletal: Positive for back pain.  Neurological: Negative for weakness and numbness.  All other systems reviewed and are negative.    Physical Exam Updated Vital Signs BP 129/77 (BP Location: Left Arm)   Pulse 78   Temp 98.4 F (36.9 C) (Oral)   Resp 18   Ht 5\' 9"  (1.753 m)   Wt 93 kg   SpO2 97%   BMI 30.27 kg/m   Physical Exam CONSTITUTIONAL: Well developed/well nourished HEAD: Normocephalic/atraumatic EYES: EOMI/PERRL ENMT: Mucous membranes moist NECK: supple no meningeal signs SPINE/BACK:mild thoracic tenderness, paraspinal tenderness noted, No bruising/crepitance/stepoffs noted to spine CV: S1/S2 noted, no murmurs/rubs/gallops noted LUNGS: Lungs are clear to auscultation bilaterally, no apparent distress ABDOMEN: soft, nontender NEURO: Awake/alert, equal motor 5/5 strength noted with the following: hip flexion/knee flexion/extension, foot dorsi/plantar flexion, no clonus bilaterally,no sensory deficit in  any dermatome. Pt is able to ambulate unassisted. EXTREMITIES: pulses normal, full ROM SKIN: warm, color normal PSYCH: no abnormalities of mood noted, alert and oriented to situation    ED Treatments / Results  Labs (all labs ordered are listed, but only abnormal results are displayed) Labs Reviewed  URINALYSIS, ROUTINE W REFLEX MICROSCOPIC (NOT AT [facility]) - Abnormal; Notable for the following:       Result Value   Specific Gravity, Urine <1.005 (*)    Hgb urine dipstick MODERATE (*)    Nitrite POSITIVE (*)    All other components within normal limits  URINE MICROSCOPIC-ADD ON - Abnormal; Notable for the following:    Squamous Epithelial / LPF 0-5 (*)    Bacteria, UA MANY (*)    All other components within normal limits  POC URINE PREG, ED  EKG  EKG Interpretation None       Radiology No results found.  Procedures Procedures (including critical care time)  Medications Ordered in ED Medications  ibuprofen (ADVIL,MOTRIN) tablet 800 mg (800 mg Oral Given 01/08/16 0113)     Initial Impression / Assessment and Plan / ED Course  I have reviewed the triage vital signs and the nursing notes.  Pertinent labs  results that were available during my care of the patient were reviewed by me and considered in my medical decision making (see chart for details).  Clinical Course     Plan to treat with NSAIDs/muscle relaxant Also noted to have UTI Work note given for 3 days, otherwise f/u with PCP if no improvement   Final Clinical Impressions(s) / ED Diagnoses   Final diagnoses:  Muscle strain  Acute cystitis with hematuria    New Prescriptions New Prescriptions   CYCLOBENZAPRINE (FLEXERIL) 10 MG TABLET    Take 1 tablet (10 mg total) by mouth 3 (three) times daily as needed for muscle spasms.   IBUPROFEN (ADVIL,MOTRIN) 600 MG TABLET    Take 1 tablet (600 mg total) by mouth every 6 (six) hours as needed.   SULFAMETHOXAZOLE-TRIMETHOPRIM (BACTRIM DS,SEPTRA DS) 800-160  MG TABLET    Take 1 tablet by mouth 2 (two) times daily.     Ripley Fraise, MD 01/08/16 [phone]

## 2016-01-08 NOTE — Discharge Instructions (Signed)

## 2016-01-08 NOTE — ED Triage Notes (Signed)
Pt was pulling a pallet at work two nights ago when she started she pulled something in her back. Pt states she has tried heating pad and Tylenol but hasn't experienced any relief.

## 2016-01-26 ENCOUNTER — Other Ambulatory Visit: Payer: Self-pay | Admitting: Family Medicine

## 2016-02-06 DIAGNOSIS — G5602 Carpal tunnel syndrome, left upper limb: Secondary | ICD-10-CM | POA: Diagnosis not present

## 2016-02-06 DIAGNOSIS — G5601 Carpal tunnel syndrome, right upper limb: Secondary | ICD-10-CM | POA: Diagnosis not present

## 2016-02-13 DIAGNOSIS — Z4789 Encounter for other orthopedic aftercare: Secondary | ICD-10-CM | POA: Diagnosis not present

## 2016-02-20 DIAGNOSIS — G5602 Carpal tunnel syndrome, left upper limb: Secondary | ICD-10-CM | POA: Diagnosis not present

## 2016-03-06 DIAGNOSIS — R200 Anesthesia of skin: Secondary | ICD-10-CM | POA: Diagnosis not present

## 2016-03-06 DIAGNOSIS — Z4789 Encounter for other orthopedic aftercare: Secondary | ICD-10-CM | POA: Diagnosis not present

## 2016-03-06 DIAGNOSIS — G5601 Carpal tunnel syndrome, right upper limb: Secondary | ICD-10-CM | POA: Diagnosis not present

## 2016-03-13 ENCOUNTER — Other Ambulatory Visit: Payer: Self-pay | Admitting: Family Medicine

## 2016-03-21 ENCOUNTER — Encounter (HOSPITAL_COMMUNITY): Payer: Self-pay | Admitting: *Deleted

## 2016-03-21 ENCOUNTER — Emergency Department (HOSPITAL_COMMUNITY)
Admission: EM | Admit: 2016-03-21 | Discharge: 2016-03-21 | Disposition: A | Discharge status: Home / Self Care | Payer: Medicare Other | Attending: Emergency Medicine | Admitting: Emergency Medicine

## 2016-03-21 DIAGNOSIS — Z79899 Other long term (current) drug therapy: Secondary | ICD-10-CM | POA: Diagnosis not present

## 2016-03-21 DIAGNOSIS — F17210 Nicotine dependence, cigarettes, uncomplicated: Secondary | ICD-10-CM | POA: Diagnosis not present

## 2016-03-21 DIAGNOSIS — H5711 Ocular pain, right eye: Secondary | ICD-10-CM | POA: Diagnosis present

## 2016-03-21 DIAGNOSIS — J45909 Unspecified asthma, uncomplicated: Secondary | ICD-10-CM | POA: Diagnosis not present

## 2016-03-21 DIAGNOSIS — Z791 Long term (current) use of non-steroidal anti-inflammatories (NSAID): Secondary | ICD-10-CM | POA: Insufficient documentation

## 2016-03-21 DIAGNOSIS — H1031 Unspecified acute conjunctivitis, right eye: Secondary | ICD-10-CM | POA: Insufficient documentation

## 2016-03-21 DIAGNOSIS — H109 Unspecified conjunctivitis: Secondary | ICD-10-CM | POA: Diagnosis not present

## 2016-03-21 HISTORY — DX: Bipolar disorder, unspecified: F31.9

## 2016-03-21 MED ORDER — FLUORESCEIN SODIUM 0.6 MG OP STRP
1.0000 | ORAL_STRIP | Freq: Once | OPHTHALMIC | Status: AC
  Administered 2016-03-21: 1 via OPHTHALMIC

## 2016-03-21 MED ORDER — TOBRAMYCIN 0.3 % OP SOLN
1.0000 [drp] | Freq: Once | OPHTHALMIC | Status: AC
  Administered 2016-03-21: 1 [drp] via OPHTHALMIC
  Filled 2016-03-21: qty 5

## 2016-03-21 NOTE — ED Triage Notes (Signed)
Pt c/o right eye pain and sensitivity that started yesterday. Pt reports hx of eye infections since she was a child. Pt's sclera of right eye is red. Slight drainage from right eye per pt. Denies blurry vision.

## 2016-03-21 NOTE — ED Notes (Signed)
PA at bedside.

## 2016-03-21 NOTE — Discharge Instructions (Signed)
Apply the antibiotic drop to both eyes - 1 drop every 3 hours while awake for the next 7 days.  Do not wear your contacts until this infection is completely resolved.

## 2016-03-24 NOTE — ED Provider Notes (Signed)
[facility] DEPT Provider Note   CSN: [id number] Arrival date & time: 03/21/16  1448     History   Chief Complaint Chief Complaint  Patient presents with  . Eye Pain    HPI Shelby [name] is a 41 y.o. female.  The history is provided by the patient.  Eye Pain  This is a recurrent (Pt reports frequent eye infections since she was a child.  she wears eyeglasses, occasionally contacts, wore a brand new pair of contacts for one only yesterday.) problem. The current episode started yesterday. The problem occurs constantly. The problem has not changed since onset.Nothing aggravates the symptoms. Nothing relieves the symptoms. She has tried a warm compress (used warm compresses to clear the thick dc when woke this am) for the symptoms. The treatment provided moderate relief.    Past Medical History:  Diagnosis Date  . Anxiety   . Arthritis   . Asthma   . Bad memory    from [place] brain injury 2001  . Bipolar disorder ([place])   . Depression    Patient does not have a problem with depression.  . Migraines   . Traumatic brain injury [facility]) 2001   [facility]    Patient Active Problem List   Diagnosis Date Noted  . Transient alteration of awareness 09/24/2015  . Carpal tunnel syndrome 02/09/2015  . 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 Asthma   . Depression   . Anxiety   . Migraines   . Arthritis   . Memory loss 05/18/2012  . Traumatic brain injury ([place]) 05/18/2012  . Constipation 07/26/2011    Past Surgical History:  Procedure Laterality Date  . CESAREAN SECTION     x2  . cyst remove     x3 from wrist-ganglion  . INCISIONAL HERNIA REPAIR  12/27/2011   Procedure: HERNIA REPAIR INCISIONAL;  Surgeon: Jamesetta So, MD;  Location: [facility];  Service: General;  Laterality: N/A;  . INSERTION OF MESH  12/27/2011   Procedure: INSERTION OF MESH;  Surgeon: Jamesetta So, MD;  Location: [facility];  Service: General;  Laterality: N/A;  . TUBAL  LIGATION    . UMBILICAL HERNIA REPAIR  09/20/2011   Procedure: HERNIA REPAIR UMBILICAL ADULT;  Surgeon: Jamesetta So, MD;  Location: [facility];  Service: General;  Laterality: N/A;  . uterine ablation    . WOUND DEBRIDEMENT  12/27/2011   Procedure: DEBRIDEMENT ABDOMINAL WOUND;  Surgeon: Jamesetta So, MD;  Location: [facility];  Service: General;  Laterality: N/A;    OB History    Gravida Para Term Preterm AB Living   2         2   SAB TAB Ectopic Multiple Live Births                   Home Medications    Prior to Admission medications   Medication Sig Start Date End Date Taking? Authorizing Provider  albuterol (PROVENTIL HFA;VENTOLIN HFA) 108 (90 Base) MCG/ACT inhaler Inhale 1-2 puffs into the lungs every 6 (six) hours as needed for wheezing or shortness of breath. 07/25/15   Wardell Honour, MD  ALPRAZolam Duanne Moron) 0.5 MG tablet Take 1 tablet (0.5 mg total) by mouth 3 (three) times daily. Patient taking differently: Take 0.5 mg by mouth 3 (three) times daily as needed for anxiety.  10/25/15   Claretta Fraise, MD  budesonide-formoterol [facility]) 80-4.5 MCG/ACT inhaler Inhale 2 puffs into the lungs 2 (two) times daily. 07/25/15  Wardell Honour, MD  cyclobenzaprine (FLEXERIL) 10 MG tablet Take 1 tablet (10 mg total) by mouth 3 (three) times daily as needed for muscle spasms. 01/08/16   Ripley Fraise, MD  DULoxetine (CYMBALTA) 30 MG capsule TAKE 3 CAPSULES BY MOUTH DAILY AS ONE DOSE ON A FULL STOMACH 03/13/16   Claretta Fraise, MD  gabapentin (NEURONTIN) 300 MG capsule TAKE 1 CAPSULE(300 MG) BY MOUTH AT BEDTIME 09/28/15   Wardell Honour, MD  HYDROcodone-acetaminophen (NORCO/VICODIN) 5-325 MG tablet Take 1 tablet by mouth every 4 (four) hours as needed. 11/25/15   Lily Kocher, PA-C  ibuprofen (ADVIL,MOTRIN) 600 MG tablet Take 1 tablet (600 mg total) by mouth every 6 (six) hours as needed. 01/08/16   Ripley Fraise, MD  medroxyPROGESTERone (PROVERA) 5 MG tablet TAKE 1 TABLET(5 MG) BY MOUTH  DAILY 03/13/16   Claretta Fraise, MD    Family History Family History  Problem Relation Age of Onset  . Diabetes Father   . Colon cancer Paternal Grandfather     Social History Social History  Substance Use Topics  . Smoking status: Current Every Day Smoker    Packs/day: 2.00    Years: 10.00    Types: Cigarettes  . Smokeless tobacco: Never Used  . Alcohol use No     Allergies   Chantix [varenicline]; Penicillins; and Latex   Review of Systems Review of Systems  Constitutional: Negative.   HENT: Negative.  Negative for congestion, postnasal drip, rhinorrhea, sinus pain and sore throat.   Eyes: Positive for pain, discharge, redness and itching. Negative for visual disturbance.  Gastrointestinal: Negative for nausea.  Skin: Negative for rash.     Physical Exam Updated Vital Signs BP 136/88 (BP Location: Left Arm)   Pulse 74   Temp 97.8 F (36.6 C) (Oral)   Resp 16   Ht 5\' 9"  (1.753 m)   Wt 94.3 kg   SpO2 99%   BMI 30.72 kg/m   Physical Exam  Constitutional: She is oriented to person, place, and time. She appears well-developed and well-nourished.  HENT:  Head: Normocephalic and atraumatic.  Right Ear: Tympanic membrane and ear canal normal.  Left Ear: Tympanic membrane and ear canal normal.  Nose: No mucosal edema or rhinorrhea.  Mouth/Throat: Uvula is midline, oropharynx is clear and moist and mucous membranes are normal. No oropharyngeal exudate, posterior oropharyngeal edema or posterior oropharyngeal erythema.  Eyes: EOM are normal. Pupils are equal, round, and reactive to light. Right eye exhibits discharge. Right eye exhibits no chemosis. Right conjunctiva is injected.  Slit lamp exam:      The right eye shows no corneal abrasion, no corneal flare, no corneal ulcer, no hyphema and no fluorescein uptake.  Bilateral Distance: 20/25 R Distance: 20/25 L Distance: 20/30    Cardiovascular: Normal rate and normal heart sounds.   Pulmonary/Chest: Effort  normal. No respiratory distress. She has no wheezes. She has no rales.  Abdominal: Soft. There is no tenderness.  Musculoskeletal: Normal range of motion.  Neurological: She is alert and oriented to person, place, and time.  Skin: Skin is warm and dry. No rash noted.  Psychiatric: She has a normal mood and affect.     ED Treatments / Results  Labs (all labs ordered are listed, but only abnormal results are displayed) Labs Reviewed - No data to display  EKG  EKG Interpretation None       Radiology No results found.  Procedures Procedures (including critical care time)  Medications Ordered in ED  Medications  fluorescein ophthalmic strip 1 strip (1 strip Right Eye Given 03/21/16 1623)  tobramycin (TOBREX) 0.3 % ophthalmic solution 1 drop (1 drop Both Eyes Given 03/21/16 1623)     Initial Impression / Assessment and Plan / ED Course  I have reviewed the triage vital signs and the nursing notes.  Pertinent labs & imaging results that were available during my care of the patient were reviewed by me and considered in my medical decision making (see chart for details).     Conjunctivitis, probably viral.  Pt tx with tobrex, advised continued warm compresses prn, f/u with pcp or ophth. If sx persist or worsen.  Advised to not wear contacts, pt aware, is wearing her glasses today. Feel tobrex adequate coverage since pt rarely wears contacts, only wore brand new pair ytd, had been wearing her glasses for weeks prior.  Final Clinical Impressions(s) / ED Diagnoses   Final diagnoses:  Acute conjunctivitis of right eye, unspecified acute conjunctivitis type    New Prescriptions Discharge Medication List as of 03/21/2016  4:19 PM       Evalee Jefferson, PA-C 03/24/16 2135    Merrily Pew, MD 04/01/16 [phone]

## 2016-03-29 ENCOUNTER — Encounter (HOSPITAL_COMMUNITY): Payer: Self-pay | Admitting: Emergency Medicine

## 2016-03-29 ENCOUNTER — Emergency Department (HOSPITAL_COMMUNITY)
Admission: EM | Admit: 2016-03-29 | Discharge: 2016-03-29 | Disposition: A | Discharge status: Home / Self Care | Payer: Medicare Other | Attending: Emergency Medicine | Admitting: Emergency Medicine

## 2016-03-29 ENCOUNTER — Emergency Department (HOSPITAL_COMMUNITY): Payer: Medicare Other

## 2016-03-29 DIAGNOSIS — Z79899 Other long term (current) drug therapy: Secondary | ICD-10-CM | POA: Insufficient documentation

## 2016-03-29 DIAGNOSIS — J40 Bronchitis, not specified as acute or chronic: Secondary | ICD-10-CM | POA: Diagnosis not present

## 2016-03-29 DIAGNOSIS — F17210 Nicotine dependence, cigarettes, uncomplicated: Secondary | ICD-10-CM | POA: Diagnosis not present

## 2016-03-29 DIAGNOSIS — R05 Cough: Secondary | ICD-10-CM | POA: Diagnosis not present

## 2016-03-29 HISTORY — DX: Endometriosis, unspecified: N80.9

## 2016-03-29 MED ORDER — PREDNISONE 20 MG PO TABS: 40.0000 mg | ORAL_TABLET | Freq: Every day | ORAL | 0 refills | Status: DC

## 2016-03-29 MED ORDER — ALBUTEROL SULFATE HFA 108 (90 BASE) MCG/ACT IN AERS: 1.0000 | INHALATION_SPRAY | Freq: Four times a day (QID) | RESPIRATORY_TRACT | 0 refills | Status: DC | PRN

## 2016-03-29 MED ORDER — IPRATROPIUM-ALBUTEROL 0.5-2.5 (3) MG/3ML IN SOLN
3.0000 mL | Freq: Once | RESPIRATORY_TRACT | Status: AC
  Administered 2016-03-29: 3 mL via RESPIRATORY_TRACT
  Filled 2016-03-29: qty 3

## 2016-03-29 MED ORDER — PREDNISONE 50 MG PO TABS
60.0000 mg | ORAL_TABLET | Freq: Once | ORAL | Status: AC
  Administered 2016-03-29: 60 mg via ORAL
  Filled 2016-03-29: qty 1

## 2016-03-29 MED ORDER — ALBUTEROL SULFATE (2.5 MG/3ML) 0.083% IN NEBU
2.5000 mg | INHALATION_SOLUTION | Freq: Once | RESPIRATORY_TRACT | Status: AC
  Administered 2016-03-29: 2.5 mg via RESPIRATORY_TRACT
  Filled 2016-03-29: qty 3

## 2016-03-29 NOTE — ED Triage Notes (Signed)
Reports cough x 3 weeks, using inhaler without improvement, followed by [facility] but has not contacted as she reports too many new doctors

## 2016-03-29 NOTE — Discharge Instructions (Signed)
Start the prednisone tomorrow.  Albuterol 2 puffs 4 times a day as needed.
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
## 2016-04-07 DIAGNOSIS — F17200 Nicotine dependence, unspecified, uncomplicated: Secondary | ICD-10-CM | POA: Diagnosis not present

## 2016-04-07 DIAGNOSIS — R05 Cough: Secondary | ICD-10-CM | POA: Diagnosis not present

## 2016-04-07 DIAGNOSIS — J069 Acute upper respiratory infection, unspecified: Secondary | ICD-10-CM | POA: Diagnosis not present

## 2016-04-07 DIAGNOSIS — R0981 Nasal congestion: Secondary | ICD-10-CM | POA: Diagnosis not present

## 2016-04-07 DIAGNOSIS — F17299 Nicotine dependence, other tobacco product, with unspecified nicotine-induced disorders: Secondary | ICD-10-CM | POA: Diagnosis not present

## 2016-04-09 ENCOUNTER — Telehealth: Payer: Self-pay | Admitting: Family Medicine

## 2016-04-09 NOTE — Telephone Encounter (Signed)
What type of referral do you need? psychiatrist  Have you been seen at our office for this problem? yes (If no, schedule them an appointment.  They will need to be seen before a referral can be done.)  Is there a particular doctor or location that you prefer? local  Patient notified that referrals can take up to a week or longer to process. If they haven't heard anything within a week they should call back and speak with the referral department.

## 2016-04-09 NOTE — Telephone Encounter (Signed)
Patient would like to be referred to psychiatrist, would not give explanation.  We have not seen patient since September, so appointment was made with Dr. Livia Snellen on 04/10/16 at 3:55 pm to discuss referral.

## 2016-04-10 ENCOUNTER — Encounter: Payer: Self-pay | Admitting: Family Medicine

## 2016-04-10 ENCOUNTER — Ambulatory Visit (INDEPENDENT_AMBULATORY_CARE_PROVIDER_SITE_OTHER): Payer: Medicare Other | Admitting: Family Medicine

## 2016-04-10 VITALS — BP 121/74 | HR 76 | Ht 69.0 in | Wt 198.0 lb

## 2016-04-10 DIAGNOSIS — S069X5D Unspecified intracranial injury with loss of consciousness greater than 24 hours with return to pre-existing conscious level, subsequent encounter: Secondary | ICD-10-CM | POA: Diagnosis not present

## 2016-04-10 DIAGNOSIS — F419 Anxiety disorder, unspecified: Secondary | ICD-10-CM | POA: Diagnosis not present

## 2016-04-10 DIAGNOSIS — F331 Major depressive disorder, recurrent, moderate: Secondary | ICD-10-CM

## 2016-04-10 MED ORDER — ARIPIPRAZOLE 5 MG PO TABS: ORAL_TABLET | ORAL | 1 refills | Status: DC

## 2016-04-10 MED ORDER — ALPRAZOLAM 0.5 MG PO TABS: 0.5000 mg | ORAL_TABLET | Freq: Three times a day (TID) | ORAL | 5 refills | Status: DC | PRN
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
## 2016-04-11 ENCOUNTER — Telehealth: Payer: Self-pay | Admitting: Family Medicine

## 2016-04-11 ENCOUNTER — Other Ambulatory Visit: Payer: Self-pay | Admitting: Family Medicine

## 2016-04-11 MED ORDER — QUETIAPINE FUMARATE 50 MG PO TABS: ORAL_TABLET | ORAL | 1 refills | Status: DC

## 2016-04-11 NOTE — Telephone Encounter (Signed)
Please contact the patient Shelby [name]. Work as well. Available as generic. Hopefully cheaper.

## 2016-04-12 ENCOUNTER — Other Ambulatory Visit: Payer: Self-pay | Admitting: Family Medicine

## 2016-04-12 NOTE — Telephone Encounter (Signed)
Attempted to contact patient- NA and no vm.

## 2016-04-12 NOTE — Telephone Encounter (Signed)
Patient aware and verbalizes understanding. 

## 2016-04-30 ENCOUNTER — Encounter: Payer: Self-pay | Admitting: Family Medicine

## 2016-04-30 ENCOUNTER — Ambulatory Visit (INDEPENDENT_AMBULATORY_CARE_PROVIDER_SITE_OTHER): Payer: Medicare Other | Admitting: Family Medicine

## 2016-04-30 VITALS — BP 130/80 | HR 79 | Temp 97.9°F | Ht 69.0 in | Wt 201.0 lb

## 2016-04-30 DIAGNOSIS — M545 Low back pain, unspecified: Secondary | ICD-10-CM

## 2016-04-30 DIAGNOSIS — G8929 Other chronic pain: Secondary | ICD-10-CM

## 2016-04-30 DIAGNOSIS — F331 Major depressive disorder, recurrent, moderate: Secondary | ICD-10-CM | POA: Diagnosis not present

## 2016-04-30 DIAGNOSIS — F419 Anxiety disorder, unspecified: Secondary | ICD-10-CM | POA: Diagnosis not present

## 2016-04-30 MED ORDER — NAPROXEN 500 MG PO TABS: 500.0000 mg | ORAL_TABLET | Freq: Two times a day (BID) | ORAL | 2 refills | Status: DC

## 2016-04-30 MED ORDER — CYCLOBENZAPRINE HCL 10 MG PO TABS: 10.0000 mg | ORAL_TABLET | Freq: Three times a day (TID) | ORAL | 0 refills | Status: DC | PRN

## 2016-04-30 MED ORDER — QUETIAPINE FUMARATE 200 MG PO TABS: 200.0000 mg | ORAL_TABLET | Freq: Every day | ORAL | 5 refills | Status: DC

## 2016-04-30 NOTE — Progress Notes (Signed)
Subjective:  Patient ID: Shelby [name], female    DOB: August 29, 1975  Age: 41 y.o. MRN: [id number]  CC: Back Pain (pt here today c/o back pain that she has had since November when she was pulling a heavy pallet at work and injured her back.)   HPI Shelby [name] presents for Recheck of her mood disorder. She has not been moody or snappy. Her husband says that she is doing much better. She is titrated now to the Seroquel 100 mg 2 at bedtime. She is resting well and not anxious or depressed. Depression screen [facility] 2/9 04/30/2016 04/10/2016 10/25/2015 09/05/2015 07/25/2015  Decreased Interest 3 3 1 1 2   Down, Depressed, Hopeless 3 3 1 1 1   PHQ - 2 Score 6 6 2 2 3   Altered sleeping 2 2 1 2 3   Tired, decreased energy 1 1 1 2 3   Change in appetite 1 1 1 3 3   Feeling bad or failure about yourself  2 2 1 3 1   Trouble concentrating 1 1 1 1 1   Moving slowly or fidgety/restless 0 0 1 1 1   Suicidal thoughts 0 0 0 0 0  PHQ-9 Score 13 13 8 14 15   Difficult doing work/chores - - Somewhat difficult - -    Patient also would like to have an evaluation for her low back pain. She said it was started with onset in mid November when she was pulling a pallet at her work at [employer]. She says she was pulling the palate because the way the palate was sitting she could not push it. She was required to move it however. She felt a pop in the midline of her back she points to approximately L2-L3 region. Since that time she's had constant pain. When she is sitting and resting laying down etc. pain will be 3/10. However if she gets up to walk even short distances or stands for a moderate period of time she will get a throbbing sensation that cushion dose to 8/10. It also increases when she has to do any bending and lifting. She has been taking hydrocodone for this when it is severe. She uses Advil for routine use but that has not been helping. She says she gets better relief with over-the-counter Aleve. She denies radiation  into the buttocks or thigh   History Shelby [name] has a past medical history of Anxiety; Arthritis; Asthma; Bad memory; Bipolar disorder ([place]); Depression; Endometriosis; Migraines; and Traumatic brain injury ([place]) (2001).   She has a past surgical history that includes Cesarean section; cyst remove; uterine ablation; Tubal ligation; Umbilical hernia repair (09/20/2011); Incisional hernia repair (12/27/2011); Wound debridement (12/27/2011); and Insertion of mesh (12/27/2011).   Her family history includes Colon cancer in her paternal grandfather; Diabetes in her father.She reports that she has been smoking Cigarettes.  She has a 20.00 pack-year smoking history. She has never used smokeless tobacco. She reports that she does not drink alcohol or use drugs.  Current Outpatient Prescriptions on File Prior to Visit  Medication Sig Dispense Refill  . albuterol (PROVENTIL HFA;VENTOLIN HFA) 108 (90 Base) MCG/ACT inhaler Inhale 1-2 puffs into the lungs every 6 (six) hours as needed for wheezing or shortness of breath. 1 Inhaler 0  . ALPRAZolam (XANAX) 0.5 MG tablet Take 1 tablet (0.5 mg total) by mouth 3 (three) times daily as needed for anxiety. 90 tablet 5  . DULoxetine (CYMBALTA) 30 MG capsule TAKE 3 CAPSULES BY MOUTH DAILY AS ONE DOSE ON A FULL STOMACH  270 capsule 0  . gabapentin (NEURONTIN) 300 MG capsule TAKE 1 CAPSULE(300 MG) BY MOUTH AT BEDTIME 90 capsule 0  . ibuprofen (ADVIL,MOTRIN) 600 MG tablet Take 1 tablet (600 mg total) by mouth every 6 (six) hours as needed. 30 tablet 0  . medroxyPROGESTERone (PROVERA) 5 MG tablet TAKE 1 TABLET(5 MG) BY MOUTH DAILY 30 tablet 0   No current facility-administered medications on file prior to visit.     ROS Review of Systems  Constitutional: Negative for activity change, appetite change and fever.  HENT: Negative for congestion, rhinorrhea and sore throat.   Eyes: Negative for visual disturbance.  Respiratory: Negative for cough and shortness of breath.     Cardiovascular: Negative for chest pain and palpitations.  Gastrointestinal: Negative for abdominal pain, diarrhea and nausea.  Genitourinary: Negative for dysuria.  Musculoskeletal: Positive for arthralgias, back pain and myalgias.    Objective:  BP 130/80   Pulse 79   Temp 97.9 F (36.6 C) (Oral)   Ht 5\' 9"  (1.753 m)   Wt 201 lb (91.2 kg)   BMI 29.68 kg/m   Physical Exam  Constitutional: She is oriented to person, place, and time. She appears well-developed and well-nourished. No distress.  HENT:  Head: Normocephalic and atraumatic.  Right Ear: External ear normal.  Left Ear: External ear normal.  Nose: Nose normal.  Mouth/Throat: Oropharynx is clear and moist.  Eyes: Conjunctivae and EOM are normal. Pupils are equal, round, and reactive to light.  Neck: Normal range of motion. Neck supple. No thyromegaly present.  Cardiovascular: Normal rate, regular rhythm and normal heart sounds.   No murmur heard. Pulmonary/Chest: Effort normal and breath sounds normal. No respiratory distress. She has no wheezes. She has no rales.  Abdominal: Soft. Bowel sounds are normal. She exhibits no distension. There is no tenderness.  Musculoskeletal: She exhibits tenderness (paraspinous musculature of the L3-L5 region. There is a positive straight leg raise at 30 on the right 45 on the left. There is palpable muscle spasm in the tender region). She exhibits no edema or deformity.  Lymphadenopathy:    She has no cervical adenopathy.  Neurological: She is alert and oriented to person, place, and time. She has normal reflexes.  Skin: Skin is warm and dry.  Psychiatric: She has a normal mood and affect. Her behavior is normal. Judgment and thought content normal.    Assessment & Plan:   Shelby [name] was seen today for back pain.  Diagnoses and all orders for this visit:  Moderate episode of recurrent major depressive disorder ([facility])  Anxiety  Chronic midline low back pain without sciatica -      Ambulatory referral to Physical Therapy  Other orders -     cyclobenzaprine (FLEXERIL) 10 MG tablet; Take 1 tablet (10 mg total) by mouth 3 (three) times daily as needed for muscle spasms. -     QUEtiapine (SEROQUEL) 200 MG tablet; Take 1 tablet (200 mg total) by mouth at bedtime. -     naproxen (NAPROSYN) 500 MG tablet; Take 1 tablet (500 mg total) by mouth 2 (two) times daily with a meal.   I have discontinued Ms. Kuenzi's predniSONE and QUEtiapine. I have also changed her QUEtiapine. Additionally, I am having her start on naproxen. Lastly, I am having her maintain her gabapentin, ibuprofen, DULoxetine, medroxyPROGESTERone, albuterol, ALPRAZolam, and cyclobenzaprine.  Meds ordered this encounter  Medications  . cyclobenzaprine (FLEXERIL) 10 MG tablet    Sig: Take 1 tablet (10 mg total) by mouth 3 (  three) times daily as needed for muscle spasms.    Dispense:  10 tablet    Refill:  0  . QUEtiapine (SEROQUEL) 200 MG tablet    Sig: Take 1 tablet (200 mg total) by mouth at bedtime.    Dispense:  30 tablet    Refill:  5    **Patient requests 90 days supply**  . naproxen (NAPROSYN) 500 MG tablet    Sig: Take 1 tablet (500 mg total) by mouth 2 (two) times daily with a meal.    Dispense:  60 tablet    Refill:  2     Follow-up: Return in about 6 weeks (around 06/11/2016).  Claretta Fraise, M.D.

## 2016-05-01 ENCOUNTER — Other Ambulatory Visit: Payer: Self-pay | Admitting: Family Medicine

## 2016-05-08 NOTE — Telephone Encounter (Signed)
rx was called in yesterday

## 2016-05-13 ENCOUNTER — Ambulatory Visit: Payer: Medicare Other | Attending: Family Medicine | Admitting: Physical Therapy

## 2016-05-13 ENCOUNTER — Encounter: Payer: Self-pay | Admitting: Physical Therapy

## 2016-05-13 DIAGNOSIS — M6281 Muscle weakness (generalized): Secondary | ICD-10-CM | POA: Insufficient documentation

## 2016-05-13 DIAGNOSIS — G8929 Other chronic pain: Secondary | ICD-10-CM | POA: Insufficient documentation

## 2016-05-13 DIAGNOSIS — M545 Low back pain: Secondary | ICD-10-CM | POA: Diagnosis not present

## 2016-05-13 NOTE — Patient Instructions (Addendum)
Stretching: Hamstring (Supine)    Supporting right thigh behind knee, slowly straighten knee until stretch is felt in back of thigh. Hold ____ seconds. Repeat ____ times per set. Do ____ sets per session. Do ____ sessions per day.  http://orth.exer.us/656   Copyright  VHI. All rights reserved.  Stretching: Piriformis (Supine)    Pull right knee toward opposite shoulder. Hold ____ seconds. Relax. Repeat ____ times per set. Do ____ sets per session. Do ____ sessions per day.  http://orth.exer.us/712   Copyright  VHI. All rights reserved.  Lumbar Rotation: Caudal - Bilateral, Advanced (Supine)    Feet and knees together, arms outstretched, bring knees toward chest. Rotate knees to left, turning head in opposite direction until stretch is felt. Hold ____ seconds. Relax. Repeat ____ times per set. Do ____ sets per session. Do ____ sessions per day.  http://orth.exer.us/1022   Copyright  VHI. All rights reserved.  Pelvic Tilt: Posterior - Legs Bent (Supine)    Tighten stomach and flatten back by rolling pelvis down. Hold ____ seconds. Relax. Repeat ____ times per set. Do ____ sets per session. Do ____ sessions per day.  http://orth.exer.us/202   Copyright  VHI. All rights reserved.
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
## 2016-05-15 ENCOUNTER — Encounter: Payer: Self-pay | Admitting: Physical Therapy

## 2016-05-20 ENCOUNTER — Ambulatory Visit: Payer: Medicare Other | Admitting: Physical Therapy

## 2016-05-22 ENCOUNTER — Ambulatory Visit: Payer: Medicare Other | Admitting: Physical Therapy

## 2016-05-22 DIAGNOSIS — M6281 Muscle weakness (generalized): Secondary | ICD-10-CM | POA: Diagnosis not present

## 2016-05-22 DIAGNOSIS — G8929 Other chronic pain: Secondary | ICD-10-CM

## 2016-05-22 DIAGNOSIS — M545 Low back pain, unspecified: Secondary | ICD-10-CM
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
## 2016-05-27 ENCOUNTER — Encounter: Payer: Self-pay | Admitting: Physical Therapy

## 2016-05-29 ENCOUNTER — Encounter: Payer: Self-pay | Admitting: Physical Therapy

## 2016-05-29 ENCOUNTER — Ambulatory Visit: Payer: Medicare Other | Admitting: Physical Therapy

## 2016-05-29 DIAGNOSIS — M6281 Muscle weakness (generalized): Secondary | ICD-10-CM | POA: Diagnosis not present

## 2016-05-29 DIAGNOSIS — M545 Low back pain: Secondary | ICD-10-CM

## 2016-05-29 DIAGNOSIS — G8929 Other chronic pain: Secondary | ICD-10-CM
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
## 2016-06-03 ENCOUNTER — Ambulatory Visit: Payer: Medicare Other | Admitting: Physical Therapy

## 2016-06-06 ENCOUNTER — Encounter: Payer: Self-pay | Admitting: Physical Therapy

## 2016-06-10 ENCOUNTER — Ambulatory Visit (INDEPENDENT_AMBULATORY_CARE_PROVIDER_SITE_OTHER): Payer: Medicare Other | Admitting: Psychiatry

## 2016-06-10 ENCOUNTER — Encounter (INDEPENDENT_AMBULATORY_CARE_PROVIDER_SITE_OTHER): Payer: Self-pay

## 2016-06-10 VITALS — Ht 69.0 in

## 2016-06-10 DIAGNOSIS — F17210 Nicotine dependence, cigarettes, uncomplicated: Secondary | ICD-10-CM | POA: Diagnosis not present

## 2016-06-10 DIAGNOSIS — F331 Major depressive disorder, recurrent, moderate: Secondary | ICD-10-CM

## 2016-06-10 DIAGNOSIS — Z79899 Other long term (current) drug therapy: Secondary | ICD-10-CM | POA: Diagnosis not present

## 2016-06-10 MED ORDER — HYDROXYZINE HCL 25 MG PO TABS: 25.0000 mg | ORAL_TABLET | Freq: Every day | ORAL | 0 refills | Status: DC | PRN

## 2016-06-10 MED ORDER — FLUOXETINE HCL (PMDD) 20 MG PO CAPS: 20.0000 mg | ORAL_CAPSULE | Freq: Every day | ORAL | 1 refills | Status: DC

## 2016-06-10 NOTE — Patient Instructions (Signed)
1. Start fluoxetine 20 mg daily 2. Start hydroxyzine 25 mg daily as needed for anxiety (Try this medication instead of Xanax) 3. Return to clinic in one month for 30 mins

## 2016-06-11 ENCOUNTER — Ambulatory Visit (INDEPENDENT_AMBULATORY_CARE_PROVIDER_SITE_OTHER): Payer: Medicare Other | Admitting: Family Medicine

## 2016-06-11 ENCOUNTER — Encounter: Payer: Self-pay | Admitting: Family Medicine

## 2016-06-11 VITALS — BP 106/70 | HR 81 | Temp 98.9°F | Ht 69.0 in | Wt 216.0 lb

## 2016-06-11 DIAGNOSIS — F331 Major depressive disorder, recurrent, moderate: Secondary | ICD-10-CM

## 2016-06-11 DIAGNOSIS — M545 Low back pain, unspecified: Secondary | ICD-10-CM

## 2016-06-11 DIAGNOSIS — G8929 Other chronic pain: Secondary | ICD-10-CM | POA: Diagnosis not present

## 2016-06-11 DIAGNOSIS — N949 Unspecified condition associated with female genital organs and menstrual cycle: Secondary | ICD-10-CM | POA: Diagnosis not present

## 2016-06-11 MED ORDER — PREDNISONE 10 MG PO TABS: ORAL_TABLET | ORAL | 0 refills | Status: DC
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
## 2016-06-20 ENCOUNTER — Emergency Department (HOSPITAL_COMMUNITY)
Admission: EM | Admit: 2016-06-20 | Discharge: 2016-06-20 | Disposition: A | Discharge status: Home / Self Care | Payer: Medicare Other | Attending: Emergency Medicine | Admitting: Emergency Medicine

## 2016-06-20 ENCOUNTER — Encounter (HOSPITAL_COMMUNITY): Payer: Self-pay | Admitting: Emergency Medicine

## 2016-06-20 DIAGNOSIS — Z79899 Other long term (current) drug therapy: Secondary | ICD-10-CM | POA: Diagnosis not present

## 2016-06-20 DIAGNOSIS — Y999 Unspecified external cause status: Secondary | ICD-10-CM | POA: Insufficient documentation

## 2016-06-20 DIAGNOSIS — S61411A Laceration without foreign body of right hand, initial encounter: Secondary | ICD-10-CM | POA: Diagnosis not present

## 2016-06-20 DIAGNOSIS — W269XXA Contact with unspecified sharp object(s), initial encounter: Secondary | ICD-10-CM | POA: Insufficient documentation

## 2016-06-20 DIAGNOSIS — F17210 Nicotine dependence, cigarettes, uncomplicated: Secondary | ICD-10-CM | POA: Insufficient documentation

## 2016-06-20 DIAGNOSIS — J45909 Unspecified asthma, uncomplicated: Secondary | ICD-10-CM | POA: Insufficient documentation

## 2016-06-20 DIAGNOSIS — Z23 Encounter for immunization: Secondary | ICD-10-CM | POA: Insufficient documentation

## 2016-06-20 DIAGNOSIS — S61419A Laceration without foreign body of unspecified hand, initial encounter: Secondary | ICD-10-CM

## 2016-06-20 DIAGNOSIS — Y9389 Activity, other specified: Secondary | ICD-10-CM | POA: Diagnosis not present

## 2016-06-20 DIAGNOSIS — Y92009 Unspecified place in unspecified non-institutional (private) residence as the place of occurrence of the external cause: Secondary | ICD-10-CM | POA: Insufficient documentation

## 2016-06-20 MED ORDER — TETANUS-DIPHTH-ACELL PERTUSSIS 5-2.5-18.5 LF-MCG/0.5 IM SUSP
0.5000 mL | Freq: Once | INTRAMUSCULAR | Status: AC
  Administered 2016-06-20: 0.5 mL via INTRAMUSCULAR
  Filled 2016-06-20: qty 0.5

## 2016-06-20 NOTE — ED Triage Notes (Signed)
Patient states cut her right hand while washing dishes at home this morning.
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
## 2016-06-20 NOTE — Discharge Instructions (Signed)
Return if any problems.

## 2016-06-26 ENCOUNTER — Ambulatory Visit (INDEPENDENT_AMBULATORY_CARE_PROVIDER_SITE_OTHER): Payer: Medicare Other | Admitting: Orthopedic Surgery

## 2016-06-28 DIAGNOSIS — M545 Low back pain: Secondary | ICD-10-CM | POA: Diagnosis not present

## 2016-07-01 ENCOUNTER — Other Ambulatory Visit: Payer: Self-pay | Admitting: Family Medicine
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
## 2016-07-08 ENCOUNTER — Other Ambulatory Visit: Payer: Self-pay | Admitting: Family Medicine

## 2016-07-09 ENCOUNTER — Other Ambulatory Visit: Payer: Self-pay | Admitting: Family Medicine

## 2016-07-09 NOTE — Telephone Encounter (Signed)
Last filled 05/07/16, last seen 06/11/16. Call in

## 2016-07-10 ENCOUNTER — Encounter (HOSPITAL_COMMUNITY): Payer: Self-pay | Admitting: Psychiatry

## 2016-07-10 ENCOUNTER — Encounter (HOSPITAL_COMMUNITY): Payer: Medicare Other | Admitting: Psychiatry

## 2016-07-10 ENCOUNTER — Other Ambulatory Visit: Payer: Self-pay | Admitting: *Deleted

## 2016-07-10 ENCOUNTER — Encounter (HOSPITAL_COMMUNITY): Payer: Self-pay

## 2016-07-10 ENCOUNTER — Ambulatory Visit (INDEPENDENT_AMBULATORY_CARE_PROVIDER_SITE_OTHER): Payer: Medicare Other | Admitting: Women's Health

## 2016-07-10 ENCOUNTER — Encounter: Payer: Self-pay | Admitting: Women's Health

## 2016-07-10 ENCOUNTER — Other Ambulatory Visit: Payer: Self-pay | Admitting: Family Medicine

## 2016-07-10 ENCOUNTER — Telehealth (HOSPITAL_COMMUNITY): Payer: Self-pay | Admitting: *Deleted

## 2016-07-10 VITALS — BP 120/82 | HR 72 | Ht 69.0 in | Wt 220.0 lb

## 2016-07-10 DIAGNOSIS — Z3202 Encounter for pregnancy test, result negative: Secondary | ICD-10-CM

## 2016-07-10 DIAGNOSIS — R232 Flushing: Secondary | ICD-10-CM

## 2016-07-10 DIAGNOSIS — N644 Mastodynia: Secondary | ICD-10-CM | POA: Diagnosis not present

## 2016-07-10 DIAGNOSIS — M545 Low back pain: Secondary | ICD-10-CM | POA: Diagnosis not present

## 2016-07-10 DIAGNOSIS — N809 Endometriosis, unspecified: Secondary | ICD-10-CM

## 2016-07-10 LAB — POCT URINE PREGNANCY: PREG TEST UR: NEGATIVE

## 2016-07-10 NOTE — Telephone Encounter (Signed)
It is fine to [facility] her.

## 2016-07-10 NOTE — Progress Notes (Signed)
   Shelby [name]  Patient name: Shelby [name] MRN [id number]  Date of birth: 08/16/1975  CC & HPI:  Shelby [name] is a 41 y.o. G5P2002 Caucasian female presenting today for report of bilateral breasts being sensitive for past few months, they also feel lumpy/bumpy all over. Had mammogram ~22yrs ago. Last pap ~3-57yrs ago-Needs to be scheduled for pap. Has known endometriosis and is taking provera 5mg  nightly for past 3 years or so, starting to feel a little crampy again. Does not have periods. S/P ablation. Beginning to have hot flashes. Does smoke. Mom was in late 45s when she went through menopause. 1yo sister is starting to have sx as well.  No LMP recorded. Patient has had an ablation. The current method of family planning is s/p ablation, also on provera. Last pap >3-4 yrs ago, normal PCP: [place]  Pertinent History Reviewed:  Medical & Surgical Hx:   Past medical, surgical, family, and social history reviewed in electronic medical record Medications: Reviewed & Updated - see associated section Allergies: Reviewed in electronic medical record  Objective Findings:  Vitals: BP 120/82 (BP Location: Right Arm, Patient Position: Sitting, Cuff Size: Large)   Pulse 72   Ht 5\' 9"  (1.753 m)   Wt 220 lb (99.8 kg)   BMI 32.49 kg/m  Body mass index is 32.49 kg/m.  Physical Examination: General appearance - alert, well appearing, and in no distress Breasts - breasts appear normal, no suspicious masses, no skin or nipple changes or axillary nodes  No results found for this or any previous [name] (from the past 24 hour(s)).   Assessment & Plan:  A:   Bilateral breast sensitivity  Endometriosis   Hot flashes  P:  Diagnostic mammo/poss u/s scheduled at [facility] 6/5 @ 2:30pm, no lotion/deoderant/powder/perfume that am  Continue nightly provera  Wear layers, use fan for hot flashes  Return for Pap & physical w/ Dr. Elonda Husky (1st available). per pt preference & further  discuss endometriosis, hot flashes  Tawnya Crook CNM, [facility] 07/10/2016 4:05 PM

## 2016-07-10 NOTE — Telephone Encounter (Signed)
appt cancelled and resch. Pt verbalized understanding.

## 2016-07-10 NOTE — Telephone Encounter (Signed)
Pt came in office for f/u and was checked in at 1:25pm for a 1:00pm appt. At 1:56 pm, pt stated she would like to resch her appt due to having another doctor's appt at 2:15pm. Informed provider that pt have another appt to go to and provider is fine with pt rescheduling. Pt then stated due to her not getting the chance to meet with the doctor, she do not want her insurance billed. Informed pt message will be put back to provider for approval to [facility] her in for the day and removed from today sch. Pt would like for office to call her back with an appt. Per pt, she do not mind what the process is but she do not want her insurance billed for a visit that she did not get to see the doctor.

## 2016-07-10 NOTE — Addendum Note (Signed)
Addended by: Traci Sermon A on: 07/10/2016 04:29 PM   Modules accepted: Orders

## 2016-07-10 NOTE — Patient Instructions (Signed)
Breast ultrasound and possible mammogram Tues 6/5 @ 2:30pm at [facility], be there at 2:15, no lotion/deoderant/powder/perfume that day   For hot flashes:  Wear layers Use fan

## 2016-07-11 ENCOUNTER — Ambulatory Visit (HOSPITAL_COMMUNITY): Payer: Medicare Other | Admitting: Psychiatry

## 2016-07-11 NOTE — Progress Notes (Signed)
This encounter was created in error - please disregard.  This encounter was created in error - please disregard.
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
## 2016-07-14 ENCOUNTER — Emergency Department (HOSPITAL_COMMUNITY)
Admission: EM | Admit: 2016-07-14 | Discharge: 2016-07-14 | Disposition: A | Discharge status: Home / Self Care | Payer: Medicare Other | Attending: Dermatology | Admitting: Dermatology

## 2016-07-14 ENCOUNTER — Encounter (HOSPITAL_COMMUNITY): Payer: Self-pay | Admitting: Emergency Medicine

## 2016-07-14 DIAGNOSIS — Z5321 Procedure and treatment not carried out due to patient leaving prior to being seen by health care provider: Secondary | ICD-10-CM | POA: Diagnosis not present

## 2016-07-14 DIAGNOSIS — H578 Other specified disorders of eye and adnexa: Secondary | ICD-10-CM | POA: Diagnosis not present

## 2016-07-14 DIAGNOSIS — J45909 Unspecified asthma, uncomplicated: Secondary | ICD-10-CM | POA: Diagnosis not present

## 2016-07-14 DIAGNOSIS — Z79899 Other long term (current) drug therapy: Secondary | ICD-10-CM | POA: Insufficient documentation

## 2016-07-14 DIAGNOSIS — F17210 Nicotine dependence, cigarettes, uncomplicated: Secondary | ICD-10-CM | POA: Insufficient documentation

## 2016-07-14 NOTE — ED Triage Notes (Signed)
Patient c/o left eye irritation. Per patient hx of recurrent conjuctivitis. Denies any injury to eye or changes in vision. Patient states clear -yellow drainage.

## 2016-07-14 NOTE — ED Notes (Signed)
Pt states she has to leave to be at work at 1.

## 2016-07-15 ENCOUNTER — Telehealth: Payer: Self-pay | Admitting: Family Medicine

## 2016-07-15 NOTE — Telephone Encounter (Signed)
Rx called into pharmacy and pt is aware. 

## 2016-07-15 NOTE — Telephone Encounter (Signed)
Was RX called in? Please advise

## 2016-07-15 NOTE — Telephone Encounter (Signed)
rx called into pharmacy and pt is aware. 

## 2016-07-16 ENCOUNTER — Encounter (HOSPITAL_COMMUNITY): Payer: Self-pay

## 2016-07-22 ENCOUNTER — Encounter: Payer: Self-pay | Admitting: Obstetrics & Gynecology

## 2016-07-22 ENCOUNTER — Ambulatory Visit (INDEPENDENT_AMBULATORY_CARE_PROVIDER_SITE_OTHER): Payer: Medicare Other | Admitting: Obstetrics & Gynecology

## 2016-07-22 ENCOUNTER — Other Ambulatory Visit (HOSPITAL_COMMUNITY)
Admission: RE | Admit: 2016-07-22 | Discharge: 2016-07-22 | Disposition: A | Discharge status: Home / Self Care | Payer: Medicare Other | Source: Ambulatory Visit | Attending: Obstetrics & Gynecology | Admitting: Obstetrics & Gynecology

## 2016-07-22 VITALS — BP 134/90 | HR 105 | Ht 66.0 in | Wt 229.0 lb

## 2016-07-22 DIAGNOSIS — Z0000 Encounter for general adult medical examination without abnormal findings: Secondary | ICD-10-CM | POA: Diagnosis not present

## 2016-07-22 DIAGNOSIS — Z124 Encounter for screening for malignant neoplasm of cervix: Secondary | ICD-10-CM

## 2016-07-22 DIAGNOSIS — Z01419 Encounter for gynecological examination (general) (routine) without abnormal findings: Secondary | ICD-10-CM
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
## 2016-07-23 DIAGNOSIS — G8929 Other chronic pain: Secondary | ICD-10-CM | POA: Diagnosis not present

## 2016-07-23 DIAGNOSIS — M545 Low back pain: Secondary | ICD-10-CM | POA: Diagnosis not present

## 2016-07-24 ENCOUNTER — Telehealth: Payer: Self-pay | Admitting: Obstetrics & Gynecology

## 2016-07-24 ENCOUNTER — Other Ambulatory Visit: Payer: Self-pay | Admitting: Women's Health

## 2016-07-24 DIAGNOSIS — N6320 Unspecified lump in the left breast, unspecified quadrant: Secondary | ICD-10-CM

## 2016-07-24 DIAGNOSIS — N644 Mastodynia: Secondary | ICD-10-CM

## 2016-07-24 NOTE — Telephone Encounter (Signed)
LMOVM returing call.

## 2016-07-26 LAB — CYTOLOGY - PAP
Diagnosis: NEGATIVE
HPV (WINDOPATH): NOT DETECTED

## 2016-07-26 NOTE — Telephone Encounter (Signed)
Called patient to inform her of mammogram scheduled for 6/26 at 10:40. Verbalized understanding.

## 2016-08-06 ENCOUNTER — Ambulatory Visit (HOSPITAL_COMMUNITY)
Admission: RE | Admit: 2016-08-06 | Discharge: 2016-08-06 | Disposition: A | Discharge status: Home / Self Care | Payer: Medicare Other | Source: Ambulatory Visit | Attending: Women's Health | Admitting: Women's Health

## 2016-08-06 DIAGNOSIS — N6320 Unspecified lump in the left breast, unspecified quadrant: Secondary | ICD-10-CM

## 2016-08-06 DIAGNOSIS — N644 Mastodynia: Secondary | ICD-10-CM | POA: Insufficient documentation

## 2016-08-06 DIAGNOSIS — M545 Low back pain: Secondary | ICD-10-CM | POA: Diagnosis not present

## 2016-08-06 DIAGNOSIS — N6489 Other specified disorders of breast: Secondary | ICD-10-CM | POA: Diagnosis not present

## 2016-08-06 DIAGNOSIS — R928 Other abnormal and inconclusive findings on diagnostic imaging of breast: Secondary | ICD-10-CM | POA: Diagnosis not present

## 2016-08-08 DIAGNOSIS — M545 Low back pain: Secondary | ICD-10-CM | POA: Diagnosis not present

## 2016-08-09 DIAGNOSIS — M5136 Other intervertebral disc degeneration, lumbar region: Secondary | ICD-10-CM | POA: Diagnosis not present

## 2016-08-09 DIAGNOSIS — M5137 Other intervertebral disc degeneration, lumbosacral region: Secondary | ICD-10-CM | POA: Diagnosis not present

## 2016-08-19 DIAGNOSIS — M545 Low back pain: Secondary | ICD-10-CM | POA: Diagnosis not present

## 2016-08-26 DIAGNOSIS — M545 Low back pain: Secondary | ICD-10-CM | POA: Diagnosis not present

## 2016-08-26 DIAGNOSIS — G8929 Other chronic pain: Secondary | ICD-10-CM | POA: Diagnosis not present

## 2016-09-01 ENCOUNTER — Other Ambulatory Visit: Payer: Self-pay | Admitting: Family Medicine

## 2016-09-02 ENCOUNTER — Other Ambulatory Visit: Payer: Self-pay | Admitting: Family Medicine

## 2016-09-05 DIAGNOSIS — M545 Low back pain: Secondary | ICD-10-CM | POA: Diagnosis not present

## 2016-09-11 ENCOUNTER — Telehealth: Payer: Self-pay

## 2016-09-11 DIAGNOSIS — R1030 Lower abdominal pain, unspecified: Secondary | ICD-10-CM

## 2016-09-11 DIAGNOSIS — M545 Low back pain: Secondary | ICD-10-CM | POA: Diagnosis not present

## 2016-09-11 NOTE — Telephone Encounter (Signed)
Pt notified referral is approved Referral entered in Epic per Dr Livia Snellen

## 2016-09-11 NOTE — Telephone Encounter (Signed)
Okay to refer as requested. I assume this is for her low back pain.

## 2016-09-11 NOTE — Telephone Encounter (Signed)
Yes, go ahead with referral Thanks, WS

## 2016-09-11 NOTE — Telephone Encounter (Signed)
Patient states it is not about her lower back pain but it is about her abdominal pain. Still okay to do referral?

## 2016-09-11 NOTE — Addendum Note (Signed)
Addended by: Marin Olp on: 09/11/2016 01:32 PM   Modules accepted: Orders

## 2016-09-11 NOTE — Telephone Encounter (Signed)
Patient calling that she wants a referral to Dr Arnoldo Morale  Had one previously

## 2016-09-16 ENCOUNTER — Encounter: Payer: Self-pay | Admitting: Internal Medicine

## 2016-09-27 ENCOUNTER — Other Ambulatory Visit: Payer: Self-pay | Admitting: Family Medicine

## 2016-09-27 DIAGNOSIS — M545 Low back pain: Secondary | ICD-10-CM | POA: Diagnosis not present

## 2016-09-27 DIAGNOSIS — M5117 Intervertebral disc disorders with radiculopathy, lumbosacral region: Secondary | ICD-10-CM | POA: Diagnosis not present

## 2016-09-27 DIAGNOSIS — M5136 Other intervertebral disc degeneration, lumbar region: Secondary | ICD-10-CM | POA: Diagnosis not present

## 2016-10-09 ENCOUNTER — Other Ambulatory Visit: Payer: Self-pay | Admitting: Family Medicine

## 2016-10-11 DIAGNOSIS — G8929 Other chronic pain: Secondary | ICD-10-CM | POA: Diagnosis not present

## 2016-10-11 DIAGNOSIS — M545 Low back pain: Secondary | ICD-10-CM | POA: Diagnosis not present

## 2016-10-13 ENCOUNTER — Other Ambulatory Visit: Payer: Self-pay | Admitting: Family Medicine

## 2016-10-15 ENCOUNTER — Other Ambulatory Visit: Payer: Self-pay | Admitting: Family Medicine

## 2016-10-24 DIAGNOSIS — M461 Sacroiliitis, not elsewhere classified: Secondary | ICD-10-CM | POA: Diagnosis not present

## 2016-10-30 DIAGNOSIS — M533 Sacrococcygeal disorders, not elsewhere classified: Secondary | ICD-10-CM | POA: Diagnosis not present

## 2016-11-04 ENCOUNTER — Ambulatory Visit (INDEPENDENT_AMBULATORY_CARE_PROVIDER_SITE_OTHER): Payer: Medicare Other | Admitting: Gastroenterology

## 2016-11-04 ENCOUNTER — Encounter: Payer: Self-pay | Admitting: Gastroenterology

## 2016-11-04 VITALS — BP 138/87 | HR 97 | Temp 97.7°F | Ht 68.5 in | Wt 239.2 lb

## 2016-11-04 DIAGNOSIS — K625 Hemorrhage of anus and rectum: Secondary | ICD-10-CM

## 2016-11-04 DIAGNOSIS — R1030 Lower abdominal pain, unspecified: Secondary | ICD-10-CM

## 2016-11-04 DIAGNOSIS — R1031 Right lower quadrant pain: Secondary | ICD-10-CM

## 2016-11-04 DIAGNOSIS — K5900 Constipation, unspecified: Secondary | ICD-10-CM

## 2016-11-04 MED ORDER — LINACLOTIDE 145 MCG PO CAPS: 145.0000 ug | ORAL_CAPSULE | Freq: Every day | ORAL | 5 refills | Status: DC

## 2016-11-04 NOTE — Progress Notes (Signed)
CC'D TO PCP °

## 2016-11-04 NOTE — Assessment & Plan Note (Signed)
41 y/o female with acute on chronic lower abdominal pain, worse with movement, unrelated to bowel function or meals. She does have chronic constipation, managed with intermittent OTC laxative only. Some brbpr. No prior colonoscopy. FH of CRC in grandparent. Patient with multiple abdominal surgeries, could have adhesions. Prior umbilical hernia repair with resolution of abdominal pain in 2013. Appendix remains in situ.   Plan on updating labs. She will need colonoscopy with deep sedation in OR soon for rectal bleeding. If labs abnormal, may consider CT A/P with contrast 1st. Will add Linzess 138mcg daily for constipation. Continue OTC antacids for heartburn. Further recommendations to follow.

## 2016-11-04 NOTE — Patient Instructions (Signed)
1. Please have your labs done. 2. Start Linzess 155mcg once daily before breakfast for constipation.  3. Once labs are reviewed, we will decide next step ie colonoscopy vs CT scan.
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
## 2016-11-05 LAB — SEDIMENTATION RATE: Sed Rate: 2 mm/h (ref 0–20)

## 2016-11-05 LAB — CBC WITH DIFFERENTIAL/PLATELET
BASOS PCT: 0.5 %
Basophils Absolute: 83 cells/uL (ref 0–200)
Eosinophils Absolute: 611 cells/uL — ABNORMAL HIGH (ref 15–500)
Eosinophils Relative: 3.7 %
HCT: 42.4 % (ref 35.0–45.0)
Hemoglobin: 14.7 g/dL (ref 11.7–15.5)
LYMPHS ABS: 4736 {cells}/uL — AB (ref 850–3900)
MCH: 29.4 pg (ref 27.0–33.0)
MCHC: 34.7 g/dL (ref 32.0–36.0)
MCV: 84.8 fL (ref 80.0–100.0)
MONOS PCT: 5.9 %
MPV: 8.9 fL (ref 7.5–12.5)
NEUTROS ABS: 10098 {cells}/uL — AB (ref 1500–7800)
NEUTROS PCT: 61.2 %
PLATELETS: 325 10*3/uL (ref 140–400)
RBC: 5 10*6/uL (ref 3.80–5.10)
RDW: 12.9 % (ref 11.0–15.0)
Total Lymphocyte: 28.7 %
WBC mixed population: 974 cells/uL — ABNORMAL HIGH (ref 200–950)
WBC: 16.5 10*3/uL — AB (ref 3.8–10.8)

## 2016-11-05 LAB — COMPREHENSIVE METABOLIC PANEL
AG Ratio: 1.9 (calc) (ref 1.0–2.5)
ALBUMIN MSPROF: 4 g/dL (ref 3.6–5.1)
ALT: 26 U/L (ref 6–29)
AST: 13 U/L (ref 10–30)
Alkaline phosphatase (APISO): 53 U/L (ref 33–115)
BUN: 16 mg/dL (ref 7–25)
CHLORIDE: 105 mmol/L (ref 98–110)
CO2: 29 mmol/L (ref 20–32)
CREATININE: 0.84 mg/dL (ref 0.50–1.10)
Calcium: 8.8 mg/dL (ref 8.6–10.2)
GLOBULIN: 2.1 g/dL (ref 1.9–3.7)
GLUCOSE: 78 mg/dL (ref 65–139)
Potassium: 3.8 mmol/L (ref 3.5–5.3)
Sodium: 141 mmol/L (ref 135–146)
Total Bilirubin: 0.3 mg/dL (ref 0.2–1.2)
Total Protein: 6.1 g/dL (ref 6.1–8.1)

## 2016-11-05 LAB — C-REACTIVE PROTEIN: CRP: 5.3 mg/L (ref <8.0)

## 2016-11-09 ENCOUNTER — Other Ambulatory Visit: Payer: Self-pay | Admitting: Family Medicine

## 2016-11-12 ENCOUNTER — Telehealth: Payer: Self-pay | Admitting: Internal Medicine

## 2016-11-12 NOTE — Telephone Encounter (Signed)
Rx called in to pharmacy. 

## 2016-11-12 NOTE — Telephone Encounter (Signed)
Pt called to say that she was seen by [place] on 9/24 and we were suppose to refer her somewhere and her phone has been out of service and didn't know if we had tried to call her. I see where she needs to schedule a colonoscopy. Please call her back at [phone]

## 2016-11-12 NOTE — Telephone Encounter (Signed)
Pt doesn't need to be referred anywhere. Per OV note. LSL will review labs and decide if she needs CT or TCS. Called and informed pt that no one had tried to call her.

## 2016-11-15 ENCOUNTER — Other Ambulatory Visit: Payer: Self-pay | Admitting: Family Medicine

## 2016-11-15 NOTE — Telephone Encounter (Signed)
Last seen 06/11/16  Dr Livia Snellen

## 2016-11-16 ENCOUNTER — Other Ambulatory Visit: Payer: Self-pay | Admitting: Family Medicine

## 2016-11-18 NOTE — Progress Notes (Signed)
Labs showed elevated WBC. Otherwise unremarkable.  How is she doing? Is she still having abdominal pain? If so is it same as at time of OV?  If still having abd pain, please schedule CT A/P with contrast. Dx: lower abd pain, RLQ pain, elevated WBC.

## 2016-11-19 DIAGNOSIS — M5416 Radiculopathy, lumbar region: Secondary | ICD-10-CM | POA: Diagnosis not present

## 2016-11-20 ENCOUNTER — Other Ambulatory Visit: Payer: Self-pay

## 2016-11-20 DIAGNOSIS — R1031 Right lower quadrant pain: Secondary | ICD-10-CM

## 2016-11-28 DIAGNOSIS — Z23 Encounter for immunization: Secondary | ICD-10-CM | POA: Diagnosis not present

## 2016-12-02 ENCOUNTER — Ambulatory Visit (HOSPITAL_COMMUNITY)
Admission: RE | Admit: 2016-12-02 | Discharge: 2016-12-02 | Disposition: A | Discharge status: Home / Self Care | Payer: Medicare Other | Source: Ambulatory Visit | Attending: Gastroenterology | Admitting: Gastroenterology

## 2016-12-02 DIAGNOSIS — R1031 Right lower quadrant pain: Secondary | ICD-10-CM

## 2016-12-02 DIAGNOSIS — R1030 Lower abdominal pain, unspecified: Secondary | ICD-10-CM | POA: Diagnosis not present

## 2016-12-02 DIAGNOSIS — S2232XA Fracture of one rib, left side, initial encounter for closed fracture: Secondary | ICD-10-CM | POA: Diagnosis not present

## 2016-12-02 DIAGNOSIS — R918 Other nonspecific abnormal finding of lung field: Secondary | ICD-10-CM | POA: Insufficient documentation

## 2016-12-02 MED ORDER — IOPAMIDOL (ISOVUE-300) INJECTION 61%
100.0000 mL | Freq: Once | INTRAVENOUS | Status: AC | PRN
  Administered 2016-12-02: 100 mL via INTRAVENOUS

## 2016-12-04 ENCOUNTER — Other Ambulatory Visit: Payer: Self-pay | Admitting: Family Medicine

## 2016-12-11 ENCOUNTER — Other Ambulatory Visit: Payer: Self-pay | Admitting: Family Medicine

## 2016-12-12 ENCOUNTER — Encounter: Payer: Self-pay | Admitting: Family Medicine

## 2016-12-12 ENCOUNTER — Ambulatory Visit (INDEPENDENT_AMBULATORY_CARE_PROVIDER_SITE_OTHER): Payer: Medicare Other | Admitting: Family Medicine

## 2016-12-12 ENCOUNTER — Other Ambulatory Visit: Payer: Self-pay | Admitting: Family Medicine

## 2016-12-12 VITALS — BP 112/71 | HR 84 | Temp 97.1°F | Ht 68.5 in | Wt 248.0 lb

## 2016-12-12 DIAGNOSIS — F331 Major depressive disorder, recurrent, moderate: Secondary | ICD-10-CM

## 2016-12-12 DIAGNOSIS — S069X5D Unspecified intracranial injury with loss of consciousness greater than 24 hours with return to pre-existing conscious level, subsequent encounter: Secondary | ICD-10-CM | POA: Diagnosis not present

## 2016-12-12 MED ORDER — HYDROXYZINE HCL 25 MG PO TABS: 25.0000 mg | ORAL_TABLET | Freq: Every day | ORAL | 0 refills | Status: DC | PRN

## 2016-12-12 MED ORDER — ALPRAZOLAM 0.5 MG PO TABS: ORAL_TABLET | ORAL | 0 refills | Status: DC

## 2016-12-12 MED ORDER — DULOXETINE HCL 30 MG PO CPEP: ORAL_CAPSULE | ORAL | 0 refills | Status: DC

## 2016-12-12 MED ORDER — CYCLOBENZAPRINE HCL 10 MG PO TABS: 10.0000 mg | ORAL_TABLET | Freq: Three times a day (TID) | ORAL | 0 refills | Status: DC | PRN

## 2016-12-12 MED ORDER — ARIPIPRAZOLE 5 MG PO TABS: 10.0000 mg | ORAL_TABLET | Freq: Every day | ORAL | 0 refills | Status: DC

## 2016-12-12 MED ORDER — FLUOXETINE HCL (PMDD) 20 MG PO CAPS: ORAL_CAPSULE | ORAL | 1 refills | Status: DC

## 2016-12-12 NOTE — Progress Notes (Signed)
Subjective:  Patient ID: Shelby [name], female    DOB: Nov 27, 1975  Age: 41 y.o. MRN: [id number]  CC: Follow-up (pt here today for routine follow up of her chronic medical conditions and also wants to discuss Prozac instead of Xanax.)   HPI Shelby [name] presents for anxiety and depression. Feels like she is caught in a whirlwind. Anxious a lot angry & crying frequently.Increasing recently. Havin feelings of sadness and hopelessness. Taking multiple meds. Not helping. Based on her sister's recommendation she would like to try prozac - unaware that she is already taking it in the generic - fluoxetine.  Depression screen [facility] 2/9 12/12/2016 07/22/2016 06/11/2016  Decreased Interest 2 0 3  Down, Depressed, Hopeless 2 0 3  PHQ - 2 Score 4 0 6  Altered sleeping 1 - 2  Tired, decreased energy 1 - 1  Change in appetite 0 - 1  Feeling bad or failure about yourself  0 - 2  Trouble concentrating 1 - 1  Moving slowly or fidgety/restless 0 - 0  Suicidal thoughts 0 - 0  PHQ-9 Score 7 - 13  Difficult doing work/chores - - -    History Avantika has a past medical history of Anxiety, Arthritis, Asthma, Bad memory, Bipolar disorder ([place]), Depression, Endometriosis, Migraines, and Traumatic brain injury ([place]) (2001).   She has a past surgical history that includes Cesarean section; cyst remove; uterine ablation; Tubal ligation; Hand surgery; HERNIA REPAIR INCISIONAL (N/A, 12/27/2011); DEBRIDEMENT ABDOMINAL WOUND (N/A, 12/27/2011); INSERTION OF MESH (N/A, 12/27/2011); and HERNIA REPAIR UMBILICAL ADULT (N/A, 07/14/6946).   Her family history includes Alzheimer's disease in her maternal grandmother and paternal grandmother; Colon cancer in her paternal grandfather; Diabetes in her father.She reports that she has been smoking cigarettes.  She has a 20.00 pack-year smoking history. she has never used smokeless tobacco. She reports that she does not drink alcohol or use drugs.    ROS Review of Systems    Constitutional: Negative for activity change, appetite change and fever.  HENT: Negative for congestion, rhinorrhea and sore throat.   Eyes: Negative for visual disturbance.  Respiratory: Negative for cough and shortness of breath.   Cardiovascular: Negative for chest pain and palpitations.  Gastrointestinal: Negative for abdominal pain, diarrhea and nausea.  Genitourinary: Negative for dysuria.  Musculoskeletal: Negative for arthralgias and myalgias.  Psychiatric/Behavioral: Positive for decreased concentration and dysphoric mood. The patient is nervous/anxious.     Objective:  BP 112/71   Pulse 84   Temp (!) 97.1 F (36.2 C) (Oral)   Ht 5' 8.5" (1.74 m)   Wt 248 lb (112.5 kg)   BMI 37.16 kg/m   BP Readings from Last 3 Encounters:  12/12/16 112/71  11/04/16 138/87  07/22/16 134/90    Wt Readings from Last 3 Encounters:  12/12/16 248 lb (112.5 kg)  11/04/16 239 lb 3.2 oz (108.5 kg)  07/22/16 229 lb (103.9 kg)     Physical Exam  Constitutional: She is oriented to person, place, and time. She appears well-developed and well-nourished. No distress.  HENT:  Head: Normocephalic and atraumatic.  Right Ear: External ear normal.  Left Ear: External ear normal.  Nose: Nose normal.  Mouth/Throat: Oropharynx is clear and moist.  Eyes: Conjunctivae and EOM are normal. Pupils are equal, round, and reactive to light.  Neck: Normal range of motion. Neck supple. No thyromegaly present.  Cardiovascular: Normal rate, regular rhythm and normal heart sounds.  No murmur heard. Pulmonary/Chest: Effort normal and breath sounds normal. No  respiratory distress. She has no wheezes. She has no rales.  Abdominal: Soft. Bowel sounds are normal. She exhibits no distension. There is no tenderness.  Lymphadenopathy:    She has no cervical adenopathy.  Neurological: She is alert and oriented to person, place, and time. She has normal reflexes.  Skin: Skin is warm and dry.  Psychiatric: She has a  normal mood and affect. Her behavior is normal. Judgment and thought content normal.      Assessment & Plan:   [name] was seen today for follow-up.  Diagnoses and all orders for this visit:  Moderate episode of recurrent major depressive disorder ([place])  Traumatic brain injury, with loss of consciousness greater than 24 hours with return to pre-existing conscious level, subsequent encounter  Other orders -     ALPRAZolam (XANAX) 0.5 MG tablet; Take 1 and 1/2 tablets QHS -     ARIPiprazole (ABILIFY) 5 MG tablet; Take 2 tablets (10 mg total) by mouth at bedtime. -     cyclobenzaprine (FLEXERIL) 10 MG tablet; Take 1 tablet (10 mg total) by mouth 3 (three) times daily as needed for muscle spasms. -     Discontinue: DULoxetine (CYMBALTA) 30 MG capsule; Take 2 tabs everyday x 1 week, then 1 tab everyday x 1 week, then stop taking. -     hydrOXYzine (ATARAX/VISTARIL) 25 MG tablet; Take 1 tablet (25 mg total) by mouth daily as needed for anxiety. -     Discontinue: Fluoxetine HCl, PMDD, 20 MG CAPS; Take 2 caps daily x 1 week, then take 3 caps daily x 1 week, then take 4 caps daily.       I have discontinued Terrah M. Mcilvaine's QUEtiapine, Fluoxetine HCl (PMDD), and DULoxetine. I have also changed her ALPRAZolam and ARIPiprazole. Additionally, I am having her maintain her Acetaminophen (TYLENOL ARTHRITIS PAIN PO), linaclotide, VENTOLIN HFA, medroxyPROGESTERone, cyclobenzaprine, and hydrOXYzine.  Allergies as of 12/12/2016      Reactions   Chantix [varenicline]    Nightmares   Penicillins Other (See Comments)   Unknown   Latex Rash      Medication List        Accurate as of 12/12/16 11:59 PM. Always use your most recent med list.          ALPRAZolam 0.5 MG tablet Commonly known as:  XANAX Take 1 and 1/2 tablets QHS   ARIPiprazole 5 MG tablet Commonly known as:  ABILIFY Take 2 tablets (10 mg total) by mouth at bedtime.   cyclobenzaprine 10 MG tablet Commonly known as:   FLEXERIL Take 1 tablet (10 mg total) by mouth 3 (three) times daily as needed for muscle spasms.   DULoxetine 30 MG capsule Commonly known as:  CYMBALTA TAKE 2 CAPSULES BY MOUTH DAILY FOR 1 WEEK THEN TAKE 1 CAPSULE BY MOUTH DAILY FOR 1 WEEK THEN STOP   Fluoxetine HCl (PMDD) 20 MG Caps TAKE 2 CAPSULES BY MOUTH DAILY FOR 1 WEEK THEN TAKE 3 CAPSULES DAILY FOR 1 WEEK THEN TAKE 4 CAPSULES DAILY   hydrOXYzine 25 MG tablet Commonly known as:  ATARAX/VISTARIL Take 1 tablet (25 mg total) by mouth daily as needed for anxiety.   linaclotide 145 MCG Caps capsule Commonly known as:  LINZESS Take 1 capsule (145 mcg total) by mouth daily before breakfast.   medroxyPROGESTERone 5 MG tablet Commonly known as:  PROVERA TAKE 1 TABLET(5 MG) BY MOUTH DAILY   TYLENOL ARTHRITIS PAIN PO Take by mouth as needed.   VENTOLIN HFA 108 (  90 Base) MCG/ACT inhaler Generic drug:  albuterol INHALE 1 TO 2 PUFFS BY MOUTH EVERY 6 HOURS AS NEEDED FOR WHEEZING OR SHORTNESS OF BREATH        Follow-up: Return in about 1 month (around 01/11/2017).  Claretta Fraise, M.D.

## 2016-12-13 NOTE — Progress Notes (Signed)
Please let patient know CT does not explain her abd pain or elevated WBC. Chest CT recommended by radiologist due to nodular focus seen in left lung, ?scarring but incompletely evaluated.  ?retroverted uterus vs leiomyoma. Recommend gyn f/u. Send copy to gyn.   If patient ok with it, schedule Chest CT for left lung nodularity. Please have her repeat CBC with diff. Needs OV f/u in 2 weeks, may use urgent.

## 2016-12-15 ENCOUNTER — Encounter: Payer: Self-pay | Admitting: Family Medicine

## 2016-12-16 ENCOUNTER — Other Ambulatory Visit: Payer: Self-pay

## 2016-12-16 DIAGNOSIS — K5900 Constipation, unspecified: Secondary | ICD-10-CM

## 2016-12-16 DIAGNOSIS — K5909 Other constipation: Secondary | ICD-10-CM

## 2016-12-16 LAB — CBC WITH DIFFERENTIAL/PLATELET
BASOS ABS: 104 {cells}/uL (ref 0–200)
Basophils Relative: 0.9 %
EOS ABS: 626 {cells}/uL — AB (ref 15–500)
Eosinophils Relative: 5.4 %
HEMATOCRIT: 43.7 % (ref 35.0–45.0)
HEMOGLOBIN: 15.3 g/dL (ref 11.7–15.5)
LYMPHS ABS: 2610 {cells}/uL (ref 850–3900)
MCH: 29.7 pg (ref 27.0–33.0)
MCHC: 35 g/dL (ref 32.0–36.0)
MCV: 84.7 fL (ref 80.0–100.0)
MONOS PCT: 6.9 %
MPV: 9.2 fL (ref 7.5–12.5)
NEUTROS ABS: 7459 {cells}/uL (ref 1500–7800)
Neutrophils Relative %: 64.3 %
Platelets: 346 10*3/uL (ref 140–400)
RBC: 5.16 10*6/uL — ABNORMAL HIGH (ref 3.80–5.10)
RDW: 12.7 % (ref 11.0–15.0)
Total Lymphocyte: 22.5 %
WBC: 11.6 10*3/uL — ABNORMAL HIGH (ref 3.8–10.8)
WBCMIX: 800 {cells}/uL (ref 200–950)

## 2016-12-18 ENCOUNTER — Encounter: Payer: Self-pay | Admitting: Gastroenterology

## 2016-12-18 DIAGNOSIS — M5416 Radiculopathy, lumbar region: Secondary | ICD-10-CM | POA: Diagnosis not present

## 2016-12-19 ENCOUNTER — Other Ambulatory Visit: Payer: Self-pay | Admitting: *Deleted

## 2016-12-19 ENCOUNTER — Other Ambulatory Visit: Payer: Self-pay

## 2016-12-19 DIAGNOSIS — R911 Solitary pulmonary nodule: Secondary | ICD-10-CM

## 2016-12-19 DIAGNOSIS — K5900 Constipation, unspecified: Secondary | ICD-10-CM

## 2016-12-19 NOTE — Progress Notes (Signed)
WBC improved. I want to repeat CBC with diff in 1 month to dtermine if wbc is chronically elevated.  Keep upcoming ove. Cbc with diff in 1 month.

## 2016-12-25 ENCOUNTER — Other Ambulatory Visit: Payer: Self-pay

## 2016-12-25 DIAGNOSIS — K5900 Constipation, unspecified: Secondary | ICD-10-CM

## 2016-12-27 ENCOUNTER — Ambulatory Visit (HOSPITAL_COMMUNITY)
Admission: RE | Admit: 2016-12-27 | Discharge: 2016-12-27 | Disposition: A | Discharge status: Home / Self Care | Payer: Medicare Other | Source: Ambulatory Visit | Attending: Gastroenterology | Admitting: Gastroenterology

## 2016-12-27 DIAGNOSIS — R911 Solitary pulmonary nodule: Secondary | ICD-10-CM | POA: Diagnosis not present

## 2016-12-30 ENCOUNTER — Other Ambulatory Visit: Payer: Self-pay | Admitting: Family Medicine

## 2016-12-31 ENCOUNTER — Other Ambulatory Visit: Payer: Self-pay | Admitting: Student

## 2016-12-31 DIAGNOSIS — G8929 Other chronic pain: Secondary | ICD-10-CM

## 2016-12-31 DIAGNOSIS — M5440 Lumbago with sciatica, unspecified side: Principal | ICD-10-CM

## 2017-01-01 NOTE — Progress Notes (Signed)
Please let patient know finding in the left lower lung on prior CT was likely scarring from previous multiple rib fractures.  However she does have a 6.2 mm left upper lobe nodule that we will have to follow. These NIC for noncontrast chest CT in 6 months for left upper lobe pulmonary nodule. Patient should really try to stop smoking. Keep office visit upcoming.

## 2017-01-04 ENCOUNTER — Other Ambulatory Visit: Payer: Self-pay | Admitting: Family Medicine

## 2017-01-06 NOTE — Telephone Encounter (Signed)
Forward to pcp/[facility]

## 2017-01-07 ENCOUNTER — Telehealth: Payer: Self-pay | Admitting: Family Medicine

## 2017-01-07 DIAGNOSIS — Z6839 Body mass index (BMI) 39.0-39.9, adult: Secondary | ICD-10-CM | POA: Diagnosis not present

## 2017-01-07 DIAGNOSIS — G8929 Other chronic pain: Secondary | ICD-10-CM | POA: Diagnosis not present

## 2017-01-07 DIAGNOSIS — R030 Elevated blood-pressure reading, without diagnosis of hypertension: Secondary | ICD-10-CM | POA: Diagnosis not present

## 2017-01-07 DIAGNOSIS — M545 Low back pain: Secondary | ICD-10-CM | POA: Diagnosis not present

## 2017-01-07 MED ORDER — ALPRAZOLAM 0.5 MG PO TABS: ORAL_TABLET | ORAL | 0 refills | Status: DC

## 2017-01-07 NOTE — Telephone Encounter (Signed)
Called in,pt aware 

## 2017-01-07 NOTE — Telephone Encounter (Signed)
Just seen 12/12/16

## 2017-01-07 NOTE — Telephone Encounter (Signed)
True, go ahead with a one month supply She has a follow up appt scheduled for next week. WS

## 2017-01-09 ENCOUNTER — Ambulatory Visit
Admission: RE | Admit: 2017-01-09 | Discharge: 2017-01-09 | Disposition: A | Discharge status: Home / Self Care | Payer: Medicare Other | Source: Ambulatory Visit | Attending: Student | Admitting: Student

## 2017-01-09 DIAGNOSIS — G8929 Other chronic pain: Secondary | ICD-10-CM

## 2017-01-09 DIAGNOSIS — M5440 Lumbago with sciatica, unspecified side: Principal | ICD-10-CM

## 2017-01-09 DIAGNOSIS — M48061 Spinal stenosis, lumbar region without neurogenic claudication: Secondary | ICD-10-CM | POA: Diagnosis not present

## 2017-01-09 MED ORDER — ONDANSETRON HCL 4 MG/2ML IJ SOLN: 4.0000 mg | Freq: Four times a day (QID) | INTRAMUSCULAR | Status: DC | PRN

## 2017-01-09 MED ORDER — IOPAMIDOL (ISOVUE-M 200) INJECTION 41%
18.0000 mL | Freq: Once | INTRAMUSCULAR | Status: AC
  Administered 2017-01-09: 18 mL via INTRATHECAL

## 2017-01-09 MED ORDER — DIAZEPAM 5 MG PO TABS
10.0000 mg | ORAL_TABLET | Freq: Once | ORAL | Status: AC
  Administered 2017-01-09: 10 mg via ORAL

## 2017-01-09 NOTE — Discharge Instructions (Signed)

## 2017-01-13 ENCOUNTER — Ambulatory Visit: Payer: Medicare Other | Admitting: Family Medicine

## 2017-01-13 ENCOUNTER — Encounter: Payer: Self-pay | Admitting: Family Medicine

## 2017-01-13 ENCOUNTER — Ambulatory Visit (INDEPENDENT_AMBULATORY_CARE_PROVIDER_SITE_OTHER): Payer: Medicare Other | Admitting: Family Medicine

## 2017-01-13 ENCOUNTER — Other Ambulatory Visit: Payer: Self-pay | Admitting: Neurological Surgery

## 2017-01-13 VITALS — BP 116/81 | HR 79 | Temp 97.7°F | Ht 68.5 in | Wt 248.0 lb

## 2017-01-13 DIAGNOSIS — R911 Solitary pulmonary nodule: Secondary | ICD-10-CM | POA: Diagnosis not present

## 2017-01-13 DIAGNOSIS — M545 Low back pain: Secondary | ICD-10-CM | POA: Diagnosis not present

## 2017-01-13 DIAGNOSIS — F331 Major depressive disorder, recurrent, moderate: Secondary | ICD-10-CM | POA: Diagnosis not present

## 2017-01-13 MED ORDER — ALPRAZOLAM 0.5 MG PO TABS: ORAL_TABLET | ORAL | 2 refills | Status: DC

## 2017-01-13 MED ORDER — FLUOXETINE HCL (PMDD) 20 MG PO CAPS: 60.0000 mg | ORAL_CAPSULE | Freq: Every day | ORAL | 1 refills | Status: DC

## 2017-01-13 MED ORDER — HYDROXYZINE HCL 25 MG PO TABS: 25.0000 mg | ORAL_TABLET | Freq: Every day | ORAL | 0 refills | Status: DC | PRN

## 2017-01-13 MED ORDER — MEDROXYPROGESTERONE ACETATE 5 MG PO TABS: ORAL_TABLET | ORAL | 1 refills | Status: DC

## 2017-01-13 MED ORDER — ARIPIPRAZOLE 5 MG PO TABS: 10.0000 mg | ORAL_TABLET | Freq: Every day | ORAL | 0 refills | Status: DC

## 2017-01-13 MED ORDER — CYCLOBENZAPRINE HCL 10 MG PO TABS: 10.0000 mg | ORAL_TABLET | Freq: Three times a day (TID) | ORAL | 0 refills | Status: DC | PRN

## 2017-01-13 NOTE — Progress Notes (Signed)
Subjective:  Patient ID: Shelby [name], female    DOB: [birth year]  Age: 41 y.o. MRN: [id number]  CC: Follow-up (pt here today following up after starting Prozac)   HPI Shelby [name] presents for she states that the Prozac is working quite well.  She has improved significantly but unfortunately has also plateaued she is not quite where she needs to be.  There has been some sense of being sad and depressed and a loss of interest although it is less often than it was when she was checked last month.  Patient is rather hostile regarding the pulmonary nodule.  Apparently there was one found on imaging sometime this month and she feels that someone was rather nonchalant (her word) and telling her about it.  She thinks it might have been me.  I pulled the report and went over it in detail with her today showing that 1 of the nodules actually has "disappeared".  The other is about a quarter of an inch and recommendation is that it be rechecked in about 6 months.  I discussed with her that if she has concern for this by far her best intervention is to stop smoking she tells me that she is trying but it is very hard.  Depression screen [facility] 2/9 01/13/2017 12/12/2016 07/22/2016  Decreased Interest 1 2 0  Down, Depressed, Hopeless 1 2 0  PHQ - 2 Score 2 4 0  Altered sleeping 0 1 -  Tired, decreased energy 1 1 -  Change in appetite 0 0 -  Feeling bad or failure about yourself  0 0 -  Trouble concentrating 0 1 -  Moving slowly or fidgety/restless 0 0 -  Suicidal thoughts 0 0 -  PHQ-9 Score 3 7 -  Difficult doing work/chores - - -  Some recent data might be hidden    History Shelby [name] has a past medical history of Anxiety, Arthritis, Asthma, Bad memory, Bipolar disorder ([place]), Depression, Endometriosis, Migraines, and Traumatic brain injury ([place]) (2001).   She has a past surgical history that includes Cesarean section; cyst remove; uterine ablation; Tubal ligation; Umbilical hernia repair (09/20/2011);  Incisional hernia repair (12/27/2011); Wound debridement (12/27/2011); Insertion of mesh (12/27/2011); and Hand surgery.   Her family history includes Alzheimer's disease in her maternal grandmother and paternal grandmother; Colon cancer in her paternal grandfather; Diabetes in her father.She reports that she has been smoking cigarettes.  She has a 20.00 pack-year smoking history. she has never used smokeless tobacco. She reports that she does not drink alcohol or use drugs.    ROS Review of Systems  Constitutional: Negative for activity change, appetite change and fever.  HENT: Negative for congestion, rhinorrhea and sore throat.   Eyes: Negative for visual disturbance.  Respiratory: Negative for cough and shortness of breath.   Cardiovascular: Negative for chest pain and palpitations.  Gastrointestinal: Negative for abdominal pain, diarrhea and nausea.  Genitourinary: Negative for dysuria.  Musculoskeletal: Negative for arthralgias and myalgias.    Objective:  BP 116/81   Pulse 79   Temp 97.7 F (36.5 C) (Oral)   Ht 5' 8.5" (1.74 m)   Wt 248 lb (112.5 kg)   BMI 37.16 kg/m   BP Readings from Last 3 Encounters:  01/13/17 116/81  01/09/17 124/62  12/12/16 112/71    Wt Readings from Last 3 Encounters:  01/13/17 248 lb (112.5 kg)  12/12/16 248 lb (112.5 kg)  11/04/16 239 lb 3.2 oz (108.5 kg)     Physical  Exam  Constitutional: She is oriented to person, place, and time. She appears well-developed and well-nourished. No distress.  Cardiovascular: Normal rate and regular rhythm.  Pulmonary/Chest: Breath sounds normal.  Musculoskeletal: Normal range of motion.  Neurological: She is alert and oriented to person, place, and time.  Skin: Skin is warm and dry.  Psychiatric: Her speech is normal. Her mood appears anxious. She is aggressive. Thought content is paranoid. She expresses impulsivity.      Assessment & Plan:   Shelby [name] was seen today for follow-up.  Diagnoses and  all orders for this visit:  Moderate episode of recurrent major depressive disorder ([place])  Pulmonary nodule, left  Other orders -     Fluoxetine HCl, PMDD, 20 MG CAPS; Take 3 capsules (60 mg total) by mouth daily. -     ALPRAZolam (XANAX) 0.5 MG tablet; Take 1 and 1/2 tablets QHS -     ARIPiprazole (ABILIFY) 5 MG tablet; Take 2 tablets (10 mg total) by mouth at bedtime. -     cyclobenzaprine (FLEXERIL) 10 MG tablet; Take 1 tablet (10 mg total) by mouth 3 (three) times daily as needed for muscle spasms. -     hydrOXYzine (ATARAX/VISTARIL) 25 MG tablet; Take 1 tablet (25 mg total) by mouth daily as needed for anxiety. -     medroxyPROGESTERone (PROVERA) 5 MG tablet; TAKE 1 TABLET(5 MG) BY MOUTH DAILY       I have discontinued Shelby [name]'s DULoxetine and QUEtiapine. I have also changed her Fluoxetine HCl (PMDD). Additionally, I am having her maintain her Acetaminophen (TYLENOL ARTHRITIS PAIN PO), linaclotide, VENTOLIN HFA, ALPRAZolam, ARIPiprazole, cyclobenzaprine, hydrOXYzine, and medroxyPROGESTERone.  Allergies as of 01/13/2017      Reactions   Chantix [varenicline] Other (See Comments)   Nightmares   Penicillins Other (See Comments)   Unknown   Latex Rash      Medication List        Accurate as of 01/13/17  5:20 PM. Always use your most recent med list.          ALPRAZolam 0.5 MG tablet Commonly known as:  XANAX Take 1 and 1/2 tablets QHS   ARIPiprazole 5 MG tablet Commonly known as:  ABILIFY Take 2 tablets (10 mg total) by mouth at bedtime.   cyclobenzaprine 10 MG tablet Commonly known as:  FLEXERIL Take 1 tablet (10 mg total) by mouth 3 (three) times daily as needed for muscle spasms.   Fluoxetine HCl (PMDD) 20 MG Caps Take 3 capsules (60 mg total) by mouth daily.   hydrOXYzine 25 MG tablet Commonly known as:  ATARAX/VISTARIL Take 1 tablet (25 mg total) by mouth daily as needed for anxiety.   linaclotide 145 MCG Caps capsule Commonly known as:   LINZESS Take 1 capsule (145 mcg total) by mouth daily before breakfast.   medroxyPROGESTERone 5 MG tablet Commonly known as:  PROVERA TAKE 1 TABLET(5 MG) BY MOUTH DAILY   TYLENOL ARTHRITIS PAIN PO Take by mouth as needed.   VENTOLIN HFA 108 (90 Base) MCG/ACT inhaler Generic drug:  albuterol INHALE 1 TO 2 PUFFS BY MOUTH EVERY 6 HOURS AS NEEDED FOR WHEEZING OR SHORTNESS OF BREATH        Follow-up: Return in about 1 month (around 02/13/2017).  Claretta Fraise, M.D.

## 2017-01-16 ENCOUNTER — Ambulatory Visit (INDEPENDENT_AMBULATORY_CARE_PROVIDER_SITE_OTHER): Payer: Medicare Other | Admitting: Gastroenterology

## 2017-01-16 ENCOUNTER — Encounter: Payer: Self-pay | Admitting: Gastroenterology

## 2017-01-16 VITALS — BP 135/84 | HR 82 | Temp 97.2°F | Ht 68.0 in | Wt 247.4 lb

## 2017-01-16 DIAGNOSIS — R1031 Right lower quadrant pain: Secondary | ICD-10-CM

## 2017-01-16 DIAGNOSIS — K625 Hemorrhage of anus and rectum: Secondary | ICD-10-CM

## 2017-01-16 DIAGNOSIS — K5900 Constipation, unspecified: Secondary | ICD-10-CM

## 2017-01-16 NOTE — Assessment & Plan Note (Signed)
Needs a colonoscopy but will wait until she has recovered from her pending back surgery. Patient advised (mother aware as well) to call and schedule appointment with [place] once her neurosurgeon approves her to have a colonoscopy. Call in interim if problems. Avoid constipation and use Linzess daily prn.

## 2017-01-16 NOTE — Assessment & Plan Note (Signed)
Improved. Nothing on CT recently to explain. Needs to stay on top of constipation, utilizing Linzess. Colonoscopy when recovered from back surgery. Return to office in six month at the latest.

## 2017-01-16 NOTE — Progress Notes (Signed)
CC'ED TO PCP 

## 2017-01-16 NOTE — Patient Instructions (Signed)
1. Continue Linzess 130mcg daily as needed.  2. I will review your pre-op labs next week to see if you still need to have CBC done, etc. 3. We will remind you of need for repeat Chest CT in 06/2017.  4. Return to the office when you have been approved for colonoscopy by your neurosurgeon. Call if you have any problems in the interim.

## 2017-01-16 NOTE — Progress Notes (Signed)
Primary Care Physician: Claretta Fraise, MD  Primary Gastroenterologist:  Garfield Cornea, MD   Chief Complaint  Patient presents with  . Abdominal Pain    "mild"    HPI: Shelby [name] is a 41 y.o. female here for follow up of abdominal pain. Last seen in 10/2016. H/o chronic abdominal pain and chronic constipation. After last visit she had CT A/P with contrast that did not explain her abdominal pain. Chest CT recommended by radiologist due to nodular focus seen in left lung, ?scarring but incompletely evaluated. Chest CT showed scarring in left lower lung from previous multiple rib fractures. However there was a 6.6mm left upper lobe nodule found and will require chest CT in 6 months. She has had mildly elevated WBC on two occasions without explanation. Due for repeat but will be having it done at time of pre-op next week. She is scheduled for back surgery.   Overall her abdominal pain is improved. Intermittent. Mild lately. Pain worse with movement. Unrelated to meals. Rectal bleeding at times. Mild heartburn controlled with prn OTC antacids. Weight up 40 pounds. Takes Linzess every few days. If takes daily, then too many stools.   Current Outpatient Medications  Medication Sig Dispense Refill  . acetaminophen (TYLENOL) 650 MG CR tablet Take 1,300 mg by mouth 2 (two) times daily as needed for pain.    Marland Kitchen ALPRAZolam (XANAX) 0.5 MG tablet Take 1 and 1/2 tablets QHS (Patient taking differently: Take 1.25 mg by mouth at bedtime. Take 1 and 1/2 tablets) 45 tablet 2  . ARIPiprazole (ABILIFY) 5 MG tablet Take 2 tablets (10 mg total) by mouth at bedtime. 180 tablet 0  . cyclobenzaprine (FLEXERIL) 10 MG tablet Take 1 tablet (10 mg total) by mouth 3 (three) times daily as needed for muscle spasms. (Patient taking differently: Take 10 mg by mouth daily as needed for muscle spasms. ) 90 tablet 0  . VENTOLIN HFA 108 (90 Base) MCG/ACT inhaler INHALE 1 TO 2 PUFFS BY MOUTH EVERY 6 HOURS AS NEEDED FOR  WHEEZING OR SHORTNESS OF BREATH (Patient taking differently: INHALE  2 PUFFS BY MOUTH EVERY 6 HOURS AS NEEDED FOR WHEEZING OR SHORTNESS OF BREATH) 18 g 5  . linaclotide (LINZESS) 145 MCG CAPS capsule Take 1 capsule (145 mcg total) by mouth daily before breakfast. (Patient not taking: Reported on 01/15/2017) 30 capsule 5   No current facility-administered medications for this visit.     Allergies as of 01/16/2017 - Review Complete 01/16/2017  Allergen Reaction Noted  . Chantix [varenicline] Other (See Comments) 07/25/2015  . Penicillins Other (See Comments) 07/26/2011  . Latex Rash 09/18/2011    ROS:  General: Negative for anorexia, weight loss, fever, chills, fatigue, weakness. ENT: Negative for hoarseness, difficulty swallowing , nasal congestion. CV: Negative for chest pain, angina, palpitations, dyspnea on exertion, peripheral edema.  Respiratory: Negative for dyspnea at rest, dyspnea on exertion, cough, sputum, wheezing.  GI: See history of present illness. GU:  Negative for dysuria, hematuria, urinary incontinence, urinary frequency, nocturnal urination.  Endo: Negative for unusual weight change.    Physical Examination:   BP 135/84   Pulse 82   Temp (!) 97.2 F (36.2 C) (Oral)   Ht 5\' 8"  (1.727 m)   Wt 247 lb 6.4 oz (112.2 kg)   BMI 37.62 kg/m   General: Well-nourished, well-developed in no acute distress.  Eyes: No icterus. Mouth: Oropharyngeal mucosa moist and pink , no lesions erythema or exudate. Lungs: Clear to  auscultation bilaterally.  Heart: Regular rate and rhythm, no murmurs rubs or gallops.  Abdomen: Bowel sounds are normal, nontender, nondistended, no hepatosplenomegaly or masses, no abdominal bruits or hernia , no rebound or guarding.   Extremities: No lower extremity edema. No clubbing or deformities. Neuro: Alert and oriented x 4   Skin: Warm and dry, no jaundice.   Psych: Alert and cooperative, normal mood and affect.  Labs:  Lab Results  Component  Value Date   CREATININE 0.84 11/04/2016   BUN 16 11/04/2016   NA 141 11/04/2016   K 3.8 11/04/2016   CL 105 11/04/2016   CO2 29 11/04/2016   Lab Results  Component Value Date   ALT 26 11/04/2016   AST 13 11/04/2016   ALKPHOS 70 07/26/2011   BILITOT 0.3 11/04/2016   Lab Results  Component Value Date   WBC 11.6 (H) 12/16/2016   HGB 15.3 12/16/2016   HCT 43.7 12/16/2016   MCV 84.7 12/16/2016   PLT 346 12/16/2016    Imaging Studies: Ct Chest Wo Contrast  Result Date: 12/27/2016 CLINICAL DATA:  Chronic cough. Smoker. Left lower lobe nodule on a recent abdomen CT. EXAM: CT CHEST WITHOUT CONTRAST TECHNIQUE: Multidetector CT imaging of the chest was performed following the standard protocol without IV contrast. COMPARISON:  Abdomen CT dated 12/02/2016. Chest radiographs dated 11/26/2016. FINDINGS: Cardiovascular: No significant vascular findings. Normal heart size. No pericardial effusion. Mediastinum/Nodes: No enlarged mediastinal or axillary lymph nodes. Thyroid gland, trachea, and esophagus demonstrate no significant findings. Lungs/Pleura: 8.5 x 3.9 mm nodular density in the posterior aspect of the left upper lobe on image number 30 series 4. The 12.5 mm nodular focus seen on 12/02/2016 is currently shown to be part of an elongated, linear density in the left lower lobe extending inferiorly and anteriorly from the previously demonstrated old, healed left posterior rib deformities. No true nodule is present at this location today. No pleural fluid. Upper Abdomen: Unremarkable. Musculoskeletal: Old, healed fracture deformities of multiple left posterior and lateral ribs. Mild thoracic spine degenerative changes. IMPRESSION: 1. 6.2 mm average diameter left upper lobe nodule. Non-contrast chest CT at 6-12 months is recommended. If the nodule is stable at time of repeat CT, then future CT at 18-24 months (from today's scan) is considered optional for low-risk patients, but is recommended for  high-risk patients. This recommendation follows the consensus statement: Guidelines for Management of Incidental Pulmonary Nodules Detected on CT Images: From the Fleischner Society 2017; Radiology 2017; 284:228-243. 2. Multiple old, healed left rib fractures with associated left lower lobe scarring. Electronically Signed   By: Claudie Revering M.D.   On: 12/27/2016 15:20   Ct Lumbar Spine W Contrast  Result Date: 01/09/2017 CLINICAL DATA:  Right leg pain and numbness. EXAM: LUMBAR MYELOGRAM FLUOROSCOPY TIME:  0 minutes 30 seconds. 277.61 micro gray meter squared PROCEDURE: After thorough discussion of risks and benefits of the procedure including bleeding, infection, injury to nerves, blood vessels, adjacent structures as well as headache and CSF leak, written and oral informed consent was obtained. Consent was obtained by Dr. Nelson Chimes. Time out form was completed. Patient was positioned prone on the fluoroscopy table. Local anesthesia was provided with 1% lidocaine without epinephrine after prepped and draped in the usual sterile fashion. Puncture was performed at L5-S1 using a 5 inch 22-gauge spinal needle via right para median approach. Using a single pass through the dura, the needle was placed within the thecal sac, with return of clear CSF. 15 mL  of Isovue M-200 was injected into the thecal sac, with normal opacification of the nerve roots and cauda equina consistent with free flow within the subarachnoid space. I personally performed the lumbar puncture and administered the intrathecal contrast. I also personally performed acquisition of the myelogram images. TECHNIQUE: Contiguous axial images were obtained through the Lumbar spine after the intrathecal infusion of infusion. Coronal and sagittal reconstructions were obtained of the axial image sets. COMPARISON:  MRI 07/10/2016 FINDINGS: LUMBAR MYELOGRAM FINDINGS: Small anterior extradural defect at L4-5. Mild left lateral recess stenosis with some  diminished filling of the left L5 root sleeve. No right-sided neural compression seen. Standing flexion extension views show slight exaggeration of disc bulges at L1-2, L2-3 and L3-4. No abnormal motion occurs with flexion extension. CT LUMBAR MYELOGRAM FINDINGS: Normal appearance by CT at L3-4 and above. No stenosis or neural compression. L4-5: Endplate osteophytes and shallow protrusion of the disc more prominent in the left posterolateral direction. Mild facet hypertrophy. Left lateral recess narrowing that could affect the left L5 nerve. 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 No visible disc pathology. No apparent neural compression. Facet osteoarthritis right worse than left with pronounced sclerotic change on the right which could contribute to pain. IMPRESSION: L4-5: Endplate osteophytes and shallow protrusion of the disc more towards the left. Mild facet hypertrophy. Left lateral recess narrowing that could possibly affect the left L5 nerve. 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 No stenosis or neural compression. Sacroiliac osteoarthritis right worse than left with pronounced sclerotic change on the right which could be symptomatic. Electronically Signed   By: Nelson Chimes M.D.   On: 01/09/2017 11:28   Dg Myelography Lumbar Inj Lumbosacral  Result Date: 01/09/2017 CLINICAL DATA:  Right leg pain and numbness. EXAM: LUMBAR MYELOGRAM FLUOROSCOPY TIME:  0 minutes 30 seconds. 277.61 micro gray meter squared PROCEDURE: After thorough discussion of risks and benefits of the procedure including bleeding, infection, injury to nerves, blood vessels, adjacent structures as well as headache and CSF leak, written and oral informed consent was obtained. Consent was obtained by Dr. Nelson Chimes.  Time out form was completed. Patient was positioned prone on the fluoroscopy table. Local anesthesia was provided with 1% lidocaine without epinephrine after prepped and draped in the usual sterile fashion. Puncture was performed at L5-S1 using a 5 inch 22-gauge spinal needle via right para median approach. Using a single pass through the dura, the needle was placed within the thecal sac, with return of clear CSF. 15 mL of Isovue M-200 was injected into the thecal sac, with normal opacification of the nerve roots and cauda equina consistent with free flow within the subarachnoid space. I personally performed the lumbar puncture and administered the intrathecal contrast. I also personally performed acquisition of the myelogram images. TECHNIQUE: Contiguous axial images were obtained through the Lumbar spine after the intrathecal infusion of infusion. Coronal and sagittal reconstructions were obtained of the axial image sets. COMPARISON:  MRI 07/10/2016 FINDINGS: LUMBAR MYELOGRAM FINDINGS: Small anterior extradural defect at L4-5. Mild left lateral recess stenosis with some diminished filling of the left L5 root sleeve. No right-sided neural compression seen. Standing flexion extension views show slight exaggeration of disc bulges at L1-2, L2-3 and L3-4. No abnormal motion occurs with flexion extension. CT LUMBAR MYELOGRAM FINDINGS: Normal appearance by CT at L3-4 and above. No stenosis  or neural compression. L4-5: Endplate osteophytes and shallow protrusion of the disc more prominent in the left posterolateral direction. Mild facet hypertrophy. Left lateral recess narrowing that could affect the left L5 nerve. 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 No visible disc pathology. No apparent neural compression. Facet osteoarthritis right worse than left with pronounced sclerotic change on the right which could  contribute to pain. IMPRESSION: L4-5: Endplate osteophytes and shallow protrusion of the disc more towards the left. Mild facet hypertrophy. Left lateral recess narrowing that could possibly affect the left L5 nerve. 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 No stenosis or neural compression. Sacroiliac osteoarthritis right worse than left with pronounced sclerotic change on the right which could be symptomatic. Electronically Signed   By: Nelson Chimes M.D.   On: 01/09/2017 11:28

## 2017-01-20 NOTE — Pre-Procedure Instructions (Signed)
ALETHEA TERHAAR  01/20/2017      [facility] - [place], [place] - [address] AT [address] [place] [address] Phone: [phone] Fax: [phone]    Your procedure is scheduled on Wednesday December 12.  Report to [facility] Admitting at 12:30 P.M.  Call this number if you have problems the morning of surgery:  [phone]   Remember:  Do not eat food or drink liquids after midnight.  Take these medicines the morning of surgery with A SIP OF WATER:   Cyclobenzaprine (flexeril) if needed Ventolin Inhaler (please bring inhaler to hospital with you) Acetaminophen (tylenol) if needed  7 days prior to surgery STOP taking any Aspirin(unless otherwise instructed by your surgeon), Aleve, Naproxen, Ibuprofen, Motrin, Advil, Goody's, BC's, all herbal medications, fish oil, and all vitamins    Do not wear jewelry, make-up or nail polish.  Do not wear lotions, powders, or perfumes, or deodorant.  Do not shave 48 hours prior to surgery.  Men may shave face and neck.  Do not bring valuables to the hospital.  [facility] is not responsible for any belongings or valuables.  Contacts, dentures or bridgework may not be worn into surgery.  Leave your suitcase in the car.  After surgery it may be brought to your room.  For patients admitted to the hospital, discharge time will be determined by your treatment team.  Patients discharged the day of surgery will not be allowed to drive home.   Special instructions:    [facility]- Preparing For Surgery  Before surgery, you can play an important role. Because skin is not sterile, your skin needs to be as free of germs as possible. You can reduce the number of germs on your skin by washing with CHG (chlorahexidine gluconate) Soap before surgery.  CHG is an antiseptic cleaner which kills germs and bonds with the skin to continue killing germs even after  washing.  Please do not use if you have an allergy to CHG or antibacterial soaps. If your skin becomes reddened/irritated stop using the CHG.  Do not shave (including legs and underarms) for at least 48 hours prior to first CHG shower. It is OK to shave your face.  Please follow these instructions carefully.   1. Shower the NIGHT BEFORE SURGERY and the MORNING OF SURGERY with CHG.   2. If you chose to wash your hair, wash your hair first as usual with your normal shampoo.  3. After you shampoo, rinse your hair and body thoroughly to remove the shampoo.  4. Use CHG as you would any other liquid soap. You can apply CHG directly to the skin and wash gently with a scrungie or a clean washcloth.   5. Apply the CHG Soap to your body ONLY FROM THE NECK DOWN.  Do not use on open wounds or open sores. Avoid contact with your eyes, ears, mouth and genitals (private parts). Wash Face and genitals (private parts)  with your normal soap.  6. Wash thoroughly, paying special attention to the area where your surgery will be performed.  7. Thoroughly rinse your body with warm water from the neck down.  8. DO NOT shower/wash with your normal soap after using and rinsing off the CHG Soap.  9. Pat yourself dry with a CLEAN TOWEL.  10. Wear CLEAN PAJAMAS to bed the night before surgery, wear comfortable clothes the morning  of surgery  11. Place CLEAN SHEETS on your bed the night of your first shower and DO NOT SLEEP WITH PETS.    Day of Surgery: Do not apply any deodorants/lotions. Please wear clean clothes to the hospital/surgery center.      Please read over the following fact sheets that you were given. Coughing and Deep Breathing, MRSA Information and Surgical Site Infection Prevention

## 2017-01-21 ENCOUNTER — Telehealth: Payer: Self-pay | Admitting: Gastroenterology

## 2017-01-21 ENCOUNTER — Encounter (HOSPITAL_COMMUNITY): Payer: Self-pay

## 2017-01-21 ENCOUNTER — Other Ambulatory Visit: Payer: Self-pay

## 2017-01-21 ENCOUNTER — Ambulatory Visit (HOSPITAL_COMMUNITY)
Admission: RE | Admit: 2017-01-21 | Discharge: 2017-01-21 | Disposition: A | Discharge status: Home / Self Care | Payer: Medicare Other | Source: Ambulatory Visit | Attending: Neurological Surgery | Admitting: Neurological Surgery

## 2017-01-21 ENCOUNTER — Encounter (HOSPITAL_COMMUNITY)
Admission: RE | Admit: 2017-01-21 | Discharge: 2017-01-21 | Disposition: A | Discharge status: Home / Self Care | Payer: Medicare Other | Source: Ambulatory Visit | Attending: Neurological Surgery | Admitting: Neurological Surgery

## 2017-01-21 DIAGNOSIS — M4317 Spondylolisthesis, lumbosacral region: Secondary | ICD-10-CM | POA: Diagnosis not present

## 2017-01-21 DIAGNOSIS — F419 Anxiety disorder, unspecified: Secondary | ICD-10-CM | POA: Diagnosis not present

## 2017-01-21 DIAGNOSIS — Z91040 Latex allergy status: Secondary | ICD-10-CM | POA: Diagnosis not present

## 2017-01-21 DIAGNOSIS — Z880 Allergy status to penicillin: Secondary | ICD-10-CM | POA: Diagnosis not present

## 2017-01-21 DIAGNOSIS — R911 Solitary pulmonary nodule: Secondary | ICD-10-CM | POA: Insufficient documentation

## 2017-01-21 DIAGNOSIS — F319 Bipolar disorder, unspecified: Secondary | ICD-10-CM | POA: Diagnosis not present

## 2017-01-21 DIAGNOSIS — M549 Dorsalgia, unspecified: Secondary | ICD-10-CM | POA: Insufficient documentation

## 2017-01-21 DIAGNOSIS — M5136 Other intervertebral disc degeneration, lumbar region: Secondary | ICD-10-CM | POA: Diagnosis not present

## 2017-01-21 DIAGNOSIS — Z888 Allergy status to other drugs, medicaments and biological substances status: Secondary | ICD-10-CM | POA: Diagnosis not present

## 2017-01-21 DIAGNOSIS — Z87820 Personal history of traumatic brain injury: Secondary | ICD-10-CM | POA: Diagnosis not present

## 2017-01-21 DIAGNOSIS — Z79899 Other long term (current) drug therapy: Secondary | ICD-10-CM | POA: Diagnosis not present

## 2017-01-21 DIAGNOSIS — Z01810 Encounter for preprocedural cardiovascular examination: Secondary | ICD-10-CM | POA: Insufficient documentation

## 2017-01-21 DIAGNOSIS — F17210 Nicotine dependence, cigarettes, uncomplicated: Secondary | ICD-10-CM | POA: Diagnosis not present

## 2017-01-21 DIAGNOSIS — Z01812 Encounter for preprocedural laboratory examination: Secondary | ICD-10-CM

## 2017-01-21 LAB — CBC WITH DIFFERENTIAL/PLATELET
BASOS ABS: 0.1 10*3/uL (ref 0.0–0.1)
Basophils Relative: 1 %
Eosinophils Absolute: 0.7 10*3/uL (ref 0.0–0.7)
Eosinophils Relative: 7 %
HEMATOCRIT: 44.3 % (ref 36.0–46.0)
HEMOGLOBIN: 15.2 g/dL — AB (ref 12.0–15.0)
LYMPHS ABS: 2.1 10*3/uL (ref 0.7–4.0)
LYMPHS PCT: 21 %
MCH: 30 pg (ref 26.0–34.0)
MCHC: 34.3 g/dL (ref 30.0–36.0)
MCV: 87.4 fL (ref 78.0–100.0)
Monocytes Absolute: 0.7 10*3/uL (ref 0.1–1.0)
Monocytes Relative: 7 %
NEUTROS ABS: 6.5 10*3/uL (ref 1.7–7.7)
NEUTROS PCT: 66 %
PLATELETS: 282 10*3/uL (ref 150–400)
RBC: 5.07 MIL/uL (ref 3.87–5.11)
RDW: 12.9 % (ref 11.5–15.5)
WBC: 9.9 10*3/uL (ref 4.0–10.5)

## 2017-01-21 LAB — ABO/RH: ABO/RH(D): O POS

## 2017-01-21 LAB — BASIC METABOLIC PANEL WITH GFR
Anion gap: 10 (ref 5–15)
BUN: 6 mg/dL (ref 6–20)
CO2: 24 mmol/L (ref 22–32)
Calcium: 8.7 mg/dL — ABNORMAL LOW (ref 8.9–10.3)
Chloride: 102 mmol/L (ref 101–111)
Creatinine, Ser: 0.69 mg/dL (ref 0.44–1.00)
GFR calc Af Amer: >60 mL/min (ref >60)
GFR calc non Af Amer: >60 mL/min (ref >60)
Glucose, Bld: 92 mg/dL (ref 65–99)
Potassium: 3.5 mmol/L (ref 3.5–5.1)
Sodium: 136 mmol/L (ref 135–145)

## 2017-01-21 LAB — TYPE AND SCREEN
ABO/RH(D): O POS
Antibody Screen: NEGATIVE

## 2017-01-21 LAB — PROTIME-INR
INR: 1.06
Prothrombin Time: 13.7 s (ref 11.4–15.2)

## 2017-01-21 LAB — SURGICAL PCR SCREEN
MRSA, PCR: NEGATIVE
STAPHYLOCOCCUS AUREUS: NEGATIVE

## 2017-01-21 MED ORDER — VANCOMYCIN HCL 10 G IV SOLR
1500.0000 mg | INTRAVENOUS | Status: AC
  Administered 2017-01-22: 1000 mg via INTRAVENOUS
  Filled 2017-01-21: qty 1500

## 2017-01-21 NOTE — Telephone Encounter (Signed)
-----   Message from Florian Buff, MD sent at 01/19/2017 11:22 AM EST ----- No this is normal pelvic anatomy  Thank you for the follow up  Darlis Loan ----- Message ----- From: Mahala Menghini, PA-C Sent: 01/16/2017  11:17 AM To: Mahala Menghini, PA-C, Florian Buff, MD  Hi! This is a mutual patient who I have seen recently for RLQ abdominal pain. She had CT in 10/2016 and noted to have the following   "Reproductive: Uterus has a somewhat rounded configuration. I think this is due to a a retroverted configuration. Cannot exclude the possibility of uterine leiomyoma. However, no leiomyoma was demonstrated on ultrasound of February 2012. No evidence of ovarian mass or adnexal mass. No finding to suggest endometriosis."  You saw her in 07/2016. Does she need to follow up with you guys for this now?

## 2017-01-21 NOTE — Progress Notes (Signed)
PCP: Claretta Fraise, MD  Cardiologist: pt denies  EKG: pt denies past year, obtained today  Stress test: 5+ years ago  ECHO: pt denies ever  Cardiac Cath: pt denies ever  Chest x-ray: pt denies past year, obtained today

## 2017-01-21 NOTE — Progress Notes (Signed)
Lab Results  Component Value Date   WBC 9.9 01/21/2017   HGB 15.2 (H) 01/21/2017   HCT 44.3 01/21/2017   MCV 87.4 01/21/2017   PLT 282 01/21/2017

## 2017-01-22 ENCOUNTER — Inpatient Hospital Stay (HOSPITAL_COMMUNITY): Payer: Medicare Other | Admitting: Certified Registered Nurse Anesthetist

## 2017-01-22 ENCOUNTER — Encounter (HOSPITAL_COMMUNITY)
Admission: RE | Disposition: A | Discharge status: Home / Self Care | Payer: Self-pay | Source: Ambulatory Visit | Attending: Neurological Surgery

## 2017-01-22 ENCOUNTER — Inpatient Hospital Stay (HOSPITAL_COMMUNITY)
Admission: RE | Admit: 2017-01-22 | Discharge: 2017-01-23 | DRG: 460 | Disposition: A | Discharge status: Home / Self Care | Payer: Medicare Other | Source: Ambulatory Visit | Attending: Neurological Surgery | Admitting: Neurological Surgery

## 2017-01-22 ENCOUNTER — Inpatient Hospital Stay (HOSPITAL_COMMUNITY): Payer: Medicare Other

## 2017-01-22 ENCOUNTER — Encounter (HOSPITAL_COMMUNITY): Payer: Self-pay

## 2017-01-22 DIAGNOSIS — Z91040 Latex allergy status: Secondary | ICD-10-CM | POA: Diagnosis not present

## 2017-01-22 DIAGNOSIS — M5136 Other intervertebral disc degeneration, lumbar region: Secondary | ICD-10-CM | POA: Diagnosis not present

## 2017-01-22 DIAGNOSIS — Z419 Encounter for procedure for purposes other than remedying health state, unspecified: Secondary | ICD-10-CM

## 2017-01-22 DIAGNOSIS — M4317 Spondylolisthesis, lumbosacral region: Secondary | ICD-10-CM | POA: Diagnosis not present

## 2017-01-22 DIAGNOSIS — Z888 Allergy status to other drugs, medicaments and biological substances status: Secondary | ICD-10-CM | POA: Diagnosis not present

## 2017-01-22 DIAGNOSIS — M4316 Spondylolisthesis, lumbar region: Secondary | ICD-10-CM | POA: Diagnosis not present

## 2017-01-22 DIAGNOSIS — Z87820 Personal history of traumatic brain injury: Secondary | ICD-10-CM | POA: Diagnosis not present

## 2017-01-22 DIAGNOSIS — F418 Other specified anxiety disorders: Secondary | ICD-10-CM | POA: Diagnosis not present

## 2017-01-22 DIAGNOSIS — F419 Anxiety disorder, unspecified: Secondary | ICD-10-CM | POA: Diagnosis not present

## 2017-01-22 DIAGNOSIS — Z880 Allergy status to penicillin: Secondary | ICD-10-CM | POA: Diagnosis not present

## 2017-01-22 DIAGNOSIS — F319 Bipolar disorder, unspecified: Secondary | ICD-10-CM | POA: Diagnosis present

## 2017-01-22 DIAGNOSIS — G43909 Migraine, unspecified, not intractable, without status migrainosus: Secondary | ICD-10-CM | POA: Diagnosis not present

## 2017-01-22 DIAGNOSIS — Z79899 Other long term (current) drug therapy: Secondary | ICD-10-CM | POA: Diagnosis not present

## 2017-01-22 DIAGNOSIS — F17210 Nicotine dependence, cigarettes, uncomplicated: Secondary | ICD-10-CM | POA: Diagnosis not present

## 2017-01-22 DIAGNOSIS — Z9889 Other specified postprocedural states: Secondary | ICD-10-CM

## 2017-01-22 DIAGNOSIS — J45909 Unspecified asthma, uncomplicated: Secondary | ICD-10-CM | POA: Diagnosis not present

## 2017-01-22 DIAGNOSIS — Z981 Arthrodesis status: Secondary | ICD-10-CM | POA: Diagnosis not present

## 2017-01-22 DIAGNOSIS — M545 Low back pain: Secondary | ICD-10-CM | POA: Diagnosis not present

## 2017-01-22 SURGERY — POSTERIOR LUMBAR FUSION 1 LEVEL
Anesthesia: General | Site: Back

## 2017-01-22 MED ORDER — FENTANYL CITRATE (PF) 250 MCG/5ML IJ SOLN
INTRAMUSCULAR | Status: AC
  Filled 2017-01-22: qty 5

## 2017-01-22 MED ORDER — MORPHINE SULFATE (PF) 4 MG/ML IV SOLN
2.0000 mg | INTRAVENOUS | Status: DC | PRN
  Administered 2017-01-22: 2 mg via INTRAVENOUS
  Filled 2017-01-22: qty 1

## 2017-01-22 MED ORDER — ACETAMINOPHEN 650 MG RE SUPP: 650.0000 mg | RECTAL | Status: DC | PRN

## 2017-01-22 MED ORDER — THROMBIN (RECOMBINANT) 20000 UNITS EX SOLR
CUTANEOUS | Status: AC
  Filled 2017-01-22: qty 20000

## 2017-01-22 MED ORDER — SODIUM CHLORIDE 0.9% FLUSH: 3.0000 mL | Freq: Two times a day (BID) | INTRAVENOUS | Status: DC

## 2017-01-22 MED ORDER — SENNA 8.6 MG PO TABS
1.0000 | ORAL_TABLET | Freq: Two times a day (BID) | ORAL | Status: DC
  Administered 2017-01-22: 8.6 mg via ORAL
  Filled 2017-01-22: qty 1

## 2017-01-22 MED ORDER — 0.9 % SODIUM CHLORIDE (POUR BTL) OPTIME
TOPICAL | Status: DC | PRN
  Administered 2017-01-22: 1000 mL

## 2017-01-22 MED ORDER — LACTATED RINGERS IV SOLN
INTRAVENOUS | Status: DC | PRN
Start: 2017-01-22 — End: 2017-01-22
  Administered 2017-01-22 (×2): via INTRAVENOUS

## 2017-01-22 MED ORDER — SUGAMMADEX SODIUM 200 MG/2ML IV SOLN
INTRAVENOUS | Status: DC | PRN
  Administered 2017-01-22: 200 mg via INTRAVENOUS

## 2017-01-22 MED ORDER — METHOCARBAMOL 500 MG PO TABS
500.0000 mg | ORAL_TABLET | Freq: Four times a day (QID) | ORAL | Status: DC | PRN
  Administered 2017-01-22 – 2017-01-23 (×3): 500 mg via ORAL
  Filled 2017-01-22 (×3): qty 1

## 2017-01-22 MED ORDER — ONDANSETRON HCL 4 MG PO TABS: 4.0000 mg | ORAL_TABLET | Freq: Four times a day (QID) | ORAL | Status: DC | PRN

## 2017-01-22 MED ORDER — MIDAZOLAM HCL 2 MG/2ML IJ SOLN
INTRAMUSCULAR | Status: AC
  Filled 2017-01-22: qty 2

## 2017-01-22 MED ORDER — SODIUM CHLORIDE 0.9 % IR SOLN
Status: DC | PRN
  Administered 2017-01-22: 16:00:00

## 2017-01-22 MED ORDER — PROPOFOL 10 MG/ML IV BOLUS
INTRAVENOUS | Status: AC
  Filled 2017-01-22: qty 20

## 2017-01-22 MED ORDER — PROMETHAZINE HCL 25 MG/ML IJ SOLN: 6.2500 mg | INTRAMUSCULAR | Status: DC | PRN

## 2017-01-22 MED ORDER — LIDOCAINE 2% (20 MG/ML) 5 ML SYRINGE
INTRAMUSCULAR | Status: AC
  Filled 2017-01-22: qty 5

## 2017-01-22 MED ORDER — ONDANSETRON HCL 4 MG/2ML IJ SOLN: 4.0000 mg | Freq: Four times a day (QID) | INTRAMUSCULAR | Status: DC | PRN

## 2017-01-22 MED ORDER — MIDAZOLAM HCL 5 MG/5ML IJ SOLN
INTRAMUSCULAR | Status: DC | PRN
  Administered 2017-01-22: 2 mg via INTRAVENOUS

## 2017-01-22 MED ORDER — DEXAMETHASONE SODIUM PHOSPHATE 10 MG/ML IJ SOLN
10.0000 mg | INTRAMUSCULAR | Status: AC
  Administered 2017-01-22: 10 mg via INTRAVENOUS

## 2017-01-22 MED ORDER — BUPIVACAINE HCL (PF) 0.25 % IJ SOLN
INTRAMUSCULAR | Status: DC | PRN
  Administered 2017-01-22: 10 mL

## 2017-01-22 MED ORDER — OXYCODONE HCL 5 MG/5ML PO SOLN: 5.0000 mg | Freq: Once | ORAL | Status: DC | PRN

## 2017-01-22 MED ORDER — METHOCARBAMOL 1000 MG/10ML IJ SOLN
500.0000 mg | Freq: Four times a day (QID) | INTRAVENOUS | Status: DC | PRN
  Filled 2017-01-22: qty 5

## 2017-01-22 MED ORDER — POTASSIUM CHLORIDE IN NACL 20-0.9 MEQ/L-% IV SOLN: INTRAVENOUS | Status: DC

## 2017-01-22 MED ORDER — ROCURONIUM BROMIDE 100 MG/10ML IV SOLN
INTRAVENOUS | Status: DC | PRN
  Administered 2017-01-22 (×2): 20 mg via INTRAVENOUS
  Administered 2017-01-22: 50 mg via INTRAVENOUS

## 2017-01-22 MED ORDER — PHENOL 1.4 % MT LIQD: 1.0000 | OROMUCOSAL | Status: DC | PRN

## 2017-01-22 MED ORDER — VANCOMYCIN HCL 1000 MG IV SOLR
INTRAVENOUS | Status: AC
  Filled 2017-01-22: qty 1000

## 2017-01-22 MED ORDER — ALBUTEROL SULFATE HFA 108 (90 BASE) MCG/ACT IN AERS: 1.0000 | INHALATION_SPRAY | Freq: Four times a day (QID) | RESPIRATORY_TRACT | Status: DC | PRN

## 2017-01-22 MED ORDER — HYDROMORPHONE HCL 1 MG/ML IJ SOLN
0.2500 mg | INTRAMUSCULAR | Status: DC | PRN
  Administered 2017-01-22: 0.5 mg via INTRAVENOUS

## 2017-01-22 MED ORDER — HYDROCODONE-ACETAMINOPHEN 7.5-325 MG PO TABS
1.0000 | ORAL_TABLET | Freq: Four times a day (QID) | ORAL | Status: DC
  Administered 2017-01-22 – 2017-01-23 (×3): 1 via ORAL
  Filled 2017-01-22 (×3): qty 1

## 2017-01-22 MED ORDER — MENTHOL 3 MG MT LOZG: 1.0000 | LOZENGE | OROMUCOSAL | Status: DC | PRN

## 2017-01-22 MED ORDER — SODIUM CHLORIDE 0.9 % IV SOLN
250.0000 mL | INTRAVENOUS | Status: DC
Start: 2017-01-22 — End: 2017-01-23

## 2017-01-22 MED ORDER — ONDANSETRON HCL 4 MG/2ML IJ SOLN
INTRAMUSCULAR | Status: DC | PRN
Start: 2017-01-22 — End: 2017-01-22
  Administered 2017-01-22: 4 mg via INTRAVENOUS

## 2017-01-22 MED ORDER — BUPIVACAINE HCL (PF) 0.25 % IJ SOLN
INTRAMUSCULAR | Status: AC
  Filled 2017-01-22: qty 30

## 2017-01-22 MED ORDER — ACETAMINOPHEN 325 MG PO TABS: 650.0000 mg | ORAL_TABLET | ORAL | Status: DC | PRN

## 2017-01-22 MED ORDER — CELECOXIB 200 MG PO CAPS
200.0000 mg | ORAL_CAPSULE | Freq: Two times a day (BID) | ORAL | Status: DC
  Administered 2017-01-22: 200 mg via ORAL
  Filled 2017-01-22: qty 1

## 2017-01-22 MED ORDER — OXYCODONE HCL 5 MG PO TABS: 5.0000 mg | ORAL_TABLET | Freq: Once | ORAL | Status: DC | PRN

## 2017-01-22 MED ORDER — SUGAMMADEX SODIUM 200 MG/2ML IV SOLN
INTRAVENOUS | Status: AC
  Filled 2017-01-22: qty 2

## 2017-01-22 MED ORDER — MEPERIDINE HCL 25 MG/ML IJ SOLN: 6.2500 mg | INTRAMUSCULAR | Status: DC | PRN

## 2017-01-22 MED ORDER — SODIUM CHLORIDE 0.9% FLUSH: 3.0000 mL | INTRAVENOUS | Status: DC | PRN

## 2017-01-22 MED ORDER — FENTANYL CITRATE (PF) 100 MCG/2ML IJ SOLN
INTRAMUSCULAR | Status: DC | PRN
  Administered 2017-01-22: 50 ug via INTRAVENOUS
  Administered 2017-01-22: 150 ug via INTRAVENOUS
  Administered 2017-01-22 (×2): 50 ug via INTRAVENOUS

## 2017-01-22 MED ORDER — THROMBIN (RECOMBINANT) 5000 UNITS EX SOLR
OROMUCOSAL | Status: DC | PRN
  Administered 2017-01-22: 16:00:00 via TOPICAL

## 2017-01-22 MED ORDER — ALBUTEROL SULFATE (2.5 MG/3ML) 0.083% IN NEBU: 2.5000 mg | INHALATION_SOLUTION | Freq: Four times a day (QID) | RESPIRATORY_TRACT | Status: DC | PRN

## 2017-01-22 MED ORDER — THROMBIN (RECOMBINANT) 20000 UNITS EX SOLR
CUTANEOUS | Status: DC | PRN
  Administered 2017-01-22: 16:00:00 via TOPICAL

## 2017-01-22 MED ORDER — VANCOMYCIN HCL IN DEXTROSE 1-5 GM/200ML-% IV SOLN
1000.0000 mg | Freq: Once | INTRAVENOUS | Status: AC
  Administered 2017-01-23: 1000 mg via INTRAVENOUS
  Filled 2017-01-22: qty 200

## 2017-01-22 MED ORDER — ROCURONIUM BROMIDE 10 MG/ML (PF) SYRINGE
PREFILLED_SYRINGE | INTRAVENOUS | Status: AC
  Filled 2017-01-22: qty 5

## 2017-01-22 MED ORDER — PROPOFOL 10 MG/ML IV BOLUS
INTRAVENOUS | Status: DC | PRN
  Administered 2017-01-22: 160 mg via INTRAVENOUS

## 2017-01-22 MED ORDER — LIDOCAINE HCL (CARDIAC) 20 MG/ML IV SOLN
INTRAVENOUS | Status: DC | PRN
  Administered 2017-01-22: 100 mg via INTRAVENOUS

## 2017-01-22 MED ORDER — HYDROMORPHONE HCL 1 MG/ML IJ SOLN
INTRAMUSCULAR | Status: AC
Start: 2017-01-22 — End: 2017-01-22
  Administered 2017-01-22: 0.5 mg via INTRAVENOUS
  Filled 2017-01-22: qty 1

## 2017-01-22 MED ORDER — THROMBIN (RECOMBINANT) 5000 UNITS EX SOLR
CUTANEOUS | Status: AC
  Filled 2017-01-22: qty 5000

## 2017-01-22 SURGICAL SUPPLY — 61 items
BAG DECANTER FOR FLEXI CONT (MISCELLANEOUS) ×3 IMPLANT
BASKET BONE COLLECTION (BASKET) ×3 IMPLANT
BENZOIN TINCTURE PRP APPL 2/3 (GAUZE/BANDAGES/DRESSINGS) ×3 IMPLANT
BLADE CLIPPER SURG (BLADE) IMPLANT
BONE CANC CHIPS 20CC PCAN1/4 (Bone Implant) ×3 IMPLANT
BUR MATCHSTICK NEURO 3.0 LAGG (BURR) ×3 IMPLANT
CANISTER SUCT 3000ML PPV (MISCELLANEOUS) ×3 IMPLANT
CARTRIDGE OIL MAESTRO DRILL (MISCELLANEOUS) ×1 IMPLANT
CHIPS CANC BONE 20CC PCAN1/4 (Bone Implant) ×1 IMPLANT
CLOSURE WOUND 1/2 X4 (GAUZE/BANDAGES/DRESSINGS) ×1
CONT SPEC 4OZ CLIKSEAL STRL BL (MISCELLANEOUS) ×3 IMPLANT
COVER BACK TABLE 60X90IN (DRAPES) ×3 IMPLANT
DERMABOND ADVANCED (GAUZE/BANDAGES/DRESSINGS) ×2
DERMABOND ADVANCED .7 DNX12 (GAUZE/BANDAGES/DRESSINGS) ×1 IMPLANT
DIFFUSER DRILL AIR PNEUMATIC (MISCELLANEOUS) ×3 IMPLANT
DRAPE C-ARM 42X72 X-RAY (DRAPES) ×6 IMPLANT
DRAPE LAPAROTOMY 100X72X124 (DRAPES) ×3 IMPLANT
DRAPE POUCH INSTRU U-SHP 10X18 (DRAPES) ×3 IMPLANT
DRAPE SURG 17X23 STRL (DRAPES) ×3 IMPLANT
DRSG OPSITE POSTOP 4X6 (GAUZE/BANDAGES/DRESSINGS) ×3 IMPLANT
DURAPREP 26ML APPLICATOR (WOUND CARE) ×3 IMPLANT
ELECT BLADE 4.0 EZ CLEAN MEGAD (MISCELLANEOUS) ×3
ELECT REM PT RETURN 9FT ADLT (ELECTROSURGICAL) ×3
ELECTRODE BLDE 4.0 EZ CLN MEGD (MISCELLANEOUS) ×1 IMPLANT
ELECTRODE REM PT RTRN 9FT ADLT (ELECTROSURGICAL) ×1 IMPLANT
EVACUATOR 1/8 PVC DRAIN (DRAIN) IMPLANT
GAUZE SPONGE 4X4 16PLY XRAY LF (GAUZE/BANDAGES/DRESSINGS) IMPLANT
GLOVE BIO SURGEON STRL SZ7 (GLOVE) IMPLANT
GLOVE BIO SURGEON STRL SZ8 (GLOVE) IMPLANT
GLOVE BIOGEL PI IND STRL 7.0 (GLOVE) ×1 IMPLANT
GLOVE BIOGEL PI INDICATOR 7.0 (GLOVE) ×2
GLOVE SURG SS PI 6.0 STRL IVOR (GLOVE) ×3 IMPLANT
GLOVE SURG SS PI 7.0 STRL IVOR (GLOVE) ×3 IMPLANT
GLOVE SURG SS PI 8.0 STRL IVOR (GLOVE) ×3 IMPLANT
GOWN STRL REUS W/ TWL LRG LVL3 (GOWN DISPOSABLE) ×2 IMPLANT
GOWN STRL REUS W/ TWL XL LVL3 (GOWN DISPOSABLE) ×1 IMPLANT
GOWN STRL REUS W/TWL 2XL LVL3 (GOWN DISPOSABLE) IMPLANT
GOWN STRL REUS W/TWL LRG LVL3 (GOWN DISPOSABLE) ×4
GOWN STRL REUS W/TWL XL LVL3 (GOWN DISPOSABLE) ×2
HEMOSTAT POWDER KIT SURGIFOAM (HEMOSTASIS) ×3 IMPLANT
KIT BASIN OR (CUSTOM PROCEDURE TRAY) ×3 IMPLANT
KIT ROOM TURNOVER OR (KITS) ×3 IMPLANT
MILL MEDIUM DISP (BLADE) ×3 IMPLANT
NEEDLE ASP BONE MRW 8GX15 (NEEDLE) ×3 IMPLANT
NEEDLE HYPO 25X1 1.5 SAFETY (NEEDLE) ×3 IMPLANT
NS IRRIG 1000ML POUR BTL (IV SOLUTION) ×3 IMPLANT
OIL CARTRIDGE MAESTRO DRILL (MISCELLANEOUS) ×3
PACK LAMINECTOMY NEURO (CUSTOM PROCEDURE TRAY) ×3 IMPLANT
PAD ARMBOARD 7.5X6 YLW CONV (MISCELLANEOUS) ×9 IMPLANT
PUTTY DBM ALLOSYNC PURE 10CC (Putty) ×3 IMPLANT
SPONGE LAP 4X18 X RAY DECT (DISPOSABLE) IMPLANT
SPONGE SURGIFOAM ABS GEL 100 (HEMOSTASIS) ×3 IMPLANT
STRIP CLOSURE SKIN 1/2X4 (GAUZE/BANDAGES/DRESSINGS) ×2 IMPLANT
SUT VIC AB 0 CT1 18XCR BRD8 (SUTURE) ×1 IMPLANT
SUT VIC AB 0 CT1 8-18 (SUTURE) ×2
SUT VIC AB 2-0 CP2 18 (SUTURE) ×3 IMPLANT
SUT VIC AB 3-0 SH 8-18 (SUTURE) ×6 IMPLANT
SYR CONTROL 10ML LL (SYRINGE) ×3 IMPLANT
TOWEL GREEN STERILE (TOWEL DISPOSABLE) ×3 IMPLANT
TRAY FOLEY BAG SILVER LF 16FR (CATHETERS) ×3 IMPLANT
WATER STERILE IRR 1000ML POUR (IV SOLUTION) ×3 IMPLANT

## 2017-01-22 NOTE — Op Note (Signed)
01/22/2017  5:04 PM  PATIENT:  Shelby [name]  41 y.o. 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 Arnoldo Morale  ANESTHESIA:   General  EBL: 25 ml  Total I/O In: 1000 [I.V.:1000] Out: 250 [Urine:250]  BLOOD ADMINISTERED: none  DRAINS: None  SPECIMEN:  none  INDICATION FOR PROCEDURE: This patient presented with severe chronic back pain. Imaging showed a large pseudoarticulation at L5-S1 on the right, some retrolisthesis at L4-5, no spinal stenosis. The patient tried conservative measures without relief. Pain was debilitating. Recommended lumbar fusion L5-S1. Patient understood the risks, benefits, and alternatives and potential outcomes and wished to proceed.  PROCEDURE DETAILS: The patient was taken to the operating room and after induction of adequate generalized endotracheal anesthesia, the patient was rolled into the prone position on the Wilson frame and all pressure points were padded. The lumbar region was cleaned and then prepped with DuraPrep and draped in the usual sterile fashion. 5 cc of local anesthesia was injected and then a dorsal midline incision was made and carried down to the lumbo sacral fascia. The fascia was opened and the paraspinous musculature was taken down in a subperiosteal fashion to expose the L4-5 and L5-S1 bilaterally. Intraoperative fluoroscopy confirmed my level. We pulled on the spinous process of L5 and found no motion at L5-S1. There was significant motion at L4-5. We confirmed our level once again with fluoroscopy. The Lincoln scrubbed in and we discussed the situation. Obviously, she did not have significant  instability at L5-S1, likely because of the large pseudoarticulation giving some stability to the level. Therefore we did not think that a posterior lumbar interbody fusion hemi-facetectomies and nonsegmental fixation was completely warranted given the situation. We did think there was some instability at L4-5. We decided then and only fusion at L4-5 and L5-S1 without instrumentation (given her dysmorphic anatomy especially on the right) was the safest and most reasonable approach given our findings. I dissected in a suprafascial plane to the right iliac crest and extracted about 7 mL of bone marrow aspirate and soaked a morcellized allograft with this. I then decorticated the lamina at L4, L5 and S1 and placed the bone marrow soaked allograft to perform arthrodesis L4-S1 bilaterally. We then closed the fascia with 0 Vicryl. I closed the subcutaneous tissues with 2-0 Vicryl and the subcuticular tissues with 3-0 Vicryl. The skin was then closed with benzoin and Steri-Strips. The drapes were removed, a sterile dressing was applied. The patient was awakened from general anesthesia and transferred to the recovery room in stable condition. At the end of the procedure all sponge, needle and instrument counts were correct.    PLAN OF CARE: Admit for overnight observation  PATIENT DISPOSITION:  PACU - hemodynamically stable.   Delay start of Pharmacological VTE agent (>24hrs) due to surgical blood loss or risk of bleeding:  yes

## 2017-01-22 NOTE — Transfer of Care (Signed)
Immediate Anesthesia Transfer of Care Note  Patient: Shelby [name]  Procedure(s) Performed: Posterior lateral Lumbar  Fusion Lumbar Four-Five Lumbar Five- Sacral one (N/A Back)  Patient Location: PACU  Anesthesia Type:General  Level of Consciousness: awake, alert  and patient cooperative  Airway & Oxygen Therapy: Patient Spontanous Breathing  Post-op Assessment: Report given to RN and Post -op Vital signs reviewed and stable  Post vital signs: Reviewed and stable  Last Vitals:  Vitals:   01/22/17 1256 01/22/17 1703  BP: 125/77   Pulse: 67 90  Resp: 19 12  Temp: 36.5 C   SpO2: 95% 94%    Last Pain:  Vitals:   01/22/17 1306  TempSrc:   PainSc: 2       Patients Stated Pain Goal: 4 (02/72/53 6644)  Complications: No apparent anesthesia complications

## 2017-01-22 NOTE — Anesthesia Procedure Notes (Signed)
Procedure Name: Intubation Date/Time: 01/22/2017 2:50 PM Performed by: White, Amedeo Plenty, CRNA Pre-anesthesia Checklist: Patient identified, Emergency Drugs available, Suction available and Patient being monitored Patient Re-evaluated:Patient Re-evaluated prior to induction Oxygen Delivery Method: Circle System Utilized Preoxygenation: Pre-oxygenation with 100% oxygen Induction Type: IV induction Ventilation: Mask ventilation without difficulty Laryngoscope Size: Mac and 3 Grade View: Grade I Tube type: Oral Tube size: 7.0 mm Number of attempts: 1 Airway Equipment and Method: Stylet Placement Confirmation: ETT inserted through vocal cords under direct vision,  positive ETCO2 and breath sounds checked- equal and bilateral Secured at: 22 cm Tube secured with: Tape Dental Injury: Teeth and Oropharynx as per pre-operative assessment
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
## 2017-01-22 NOTE — Anesthesia Preprocedure Evaluation (Signed)
Anesthesia Evaluation  Patient identified by MRN, date of birth, ID band Patient awake    Reviewed: Allergy & Precautions, H&P , NPO status , Patient's Chart, lab work & pertinent test results  Airway Mallampati: I  TM Distance: >3 FB Neck ROM: Full    Dental no notable dental hx.    Pulmonary asthma , Current Smoker, PE   Pulmonary exam normal        Cardiovascular negative cardio ROS   Rhythm:Regular Rate:Normal     Neuro/Psych  Headaches, PSYCHIATRIC DISORDERS Anxiety Depression    GI/Hepatic negative GI ROS, Neg liver ROS,   Endo/Other  negative endocrine ROS  Renal/GU negative Renal ROS     Musculoskeletal negative musculoskeletal ROS (+) Arthritis , Osteoarthritis,    Abdominal Normal abdominal exam  (+) + obese,   Peds  Hematology negative hematology ROS (+)   Anesthesia Other Findings   Reproductive/Obstetrics negative OB ROS                             Anesthesia Physical  Anesthesia Plan  ASA: II  Anesthesia Plan: General   Post-op Pain Management:    Induction: Intravenous  PONV Risk Score and Plan: 2 and Ondansetron and Midazolam  Airway Management Planned: Oral ETT  Additional Equipment:   Intra-op Plan:   Post-operative Plan: Extubation in OR  Informed Consent: I have reviewed the patients History and Physical, chart, labs and discussed the procedure including the risks, benefits and alternatives for the proposed anesthesia with the patient or authorized representative who has indicated his/her understanding and acceptance.     Plan Discussed with: CRNA  Anesthesia Plan Comments:         Anesthesia Quick Evaluation

## 2017-01-23 DIAGNOSIS — Z87820 Personal history of traumatic brain injury: Secondary | ICD-10-CM | POA: Diagnosis not present

## 2017-01-23 DIAGNOSIS — Z79899 Other long term (current) drug therapy: Secondary | ICD-10-CM | POA: Diagnosis not present

## 2017-01-23 DIAGNOSIS — M5136 Other intervertebral disc degeneration, lumbar region: Secondary | ICD-10-CM | POA: Diagnosis present

## 2017-01-23 DIAGNOSIS — Z888 Allergy status to other drugs, medicaments and biological substances status: Secondary | ICD-10-CM | POA: Diagnosis not present

## 2017-01-23 DIAGNOSIS — F17210 Nicotine dependence, cigarettes, uncomplicated: Secondary | ICD-10-CM | POA: Diagnosis present

## 2017-01-23 DIAGNOSIS — Z880 Allergy status to penicillin: Secondary | ICD-10-CM | POA: Diagnosis not present

## 2017-01-23 DIAGNOSIS — M4317 Spondylolisthesis, lumbosacral region: Secondary | ICD-10-CM | POA: Diagnosis present

## 2017-01-23 DIAGNOSIS — F319 Bipolar disorder, unspecified: Secondary | ICD-10-CM | POA: Diagnosis present

## 2017-01-23 DIAGNOSIS — Z91040 Latex allergy status: Secondary | ICD-10-CM | POA: Diagnosis not present

## 2017-01-23 DIAGNOSIS — F419 Anxiety disorder, unspecified: Secondary | ICD-10-CM | POA: Diagnosis present

## 2017-01-23 MED ORDER — HYDROCODONE-ACETAMINOPHEN 7.5-325 MG PO TABS: 1.0000 | ORAL_TABLET | Freq: Four times a day (QID) | ORAL | 0 refills | Status: DC

## 2017-01-23 MED ORDER — CYCLOBENZAPRINE HCL 10 MG PO TABS: 10.0000 mg | ORAL_TABLET | Freq: Three times a day (TID) | ORAL | 1 refills | Status: DC | PRN

## 2017-01-23 NOTE — Discharge Summary (Signed)
Physician Discharge Summary  Patient ID: Shelby [name] MRN: [id number] DOB/AGE: October 20, 1975 41 y.o.  Admit date: 01/22/2017 Discharge date: 01/23/2017  Admission Diagnoses: lumbar DDD/ facet arthropathy    Discharge Diagnoses: same   Discharged Condition: good  Hospital Course: The patient was admitted on 01/22/2017 and taken to the operating room where the patient underwent lumbar on-lay fusion. The patient tolerated the procedure well and was taken to the recovery room and then to the floor in stable condition. The hospital course was routine. There were no complications. The wound remained clean dry and intact. Pt had appropriate back soreness. No complaints of leg pain or new N/T/W. The patient remained afebrile with stable vital signs, and tolerated a regular diet. The patient continued to increase activities, and pain was well controlled with oral pain medications.   Consults: None  Significant Diagnostic Studies:  Results for orders placed or performed during the hospital encounter of 01/21/17  Surgical pcr screen  Result Value Ref Range   MRSA, PCR NEGATIVE NEGATIVE   Staphylococcus aureus NEGATIVE NEGATIVE  Basic metabolic panel  Result Value Ref Range   Sodium 136 135 - 145 mmol/L   Potassium 3.5 3.5 - 5.1 mmol/L   Chloride 102 101 - 111 mmol/L   CO2 24 22 - 32 mmol/L   Glucose, Bld 92 65 - 99 mg/dL   BUN 6 6 - 20 mg/dL   Creatinine, Ser 0.69 0.44 - 1.00 mg/dL   Calcium 8.7 (L) 8.9 - 10.3 mg/dL   GFR calc non Af Amer >60 >60 mL/min   GFR calc Af Amer >60 >60 mL/min   Anion gap 10 5 - 15  CBC WITH DIFFERENTIAL  Result Value Ref Range   WBC 9.9 4.0 - 10.5 K/uL   RBC 5.07 3.87 - 5.11 MIL/uL   Hemoglobin 15.2 (H) 12.0 - 15.0 g/dL   HCT 44.3 36.0 - 46.0 %   MCV 87.4 78.0 - 100.0 fL   MCH 30.0 26.0 - 34.0 pg   MCHC 34.3 30.0 - 36.0 g/dL   RDW 12.9 11.5 - 15.5 %   Platelets 282 150 - 400 K/uL   Neutrophils Relative % 66 %   Neutro Abs 6.5 1.7 - 7.7 K/uL    Lymphocytes Relative 21 %   Lymphs Abs 2.1 0.7 - 4.0 K/uL   Monocytes Relative 7 %   Monocytes Absolute 0.7 0.1 - 1.0 K/uL   Eosinophils Relative 7 %   Eosinophils Absolute 0.7 0.0 - 0.7 K/uL   Basophils Relative 1 %   Basophils Absolute 0.1 0.0 - 0.1 K/uL  Protime-INR  Result Value Ref Range   Prothrombin Time 13.7 11.4 - 15.2 seconds   INR 1.06   Type and screen Shorter  Result Value Ref Range   ABO/RH(D) O POS    Antibody Screen NEG    Sample Expiration 02/04/2017    Extend sample reason NO TRANSFUSIONS OR PREGNANCY IN THE PAST 3 MONTHS   ABO/Rh  Result Value Ref Range   ABO/RH(D) O POS     Chest 2 View  Result Date: 01/21/2017 CLINICAL DATA:  41 year old female with a history of preop chest x-ray. Pending lumbar surgery EXAM: CHEST  2 VIEW COMPARISON:  CT 12/27/2016, chest x-ray 03/29/2016 FINDINGS: Cardiomediastinal silhouette unchanged. Left chest wall posttraumatic deformity, unchanged. No acute fracture. No pneumothorax, pleural effusion, or confluent airspace disease. Nodule identified on prior chest CT not visualized on the current plain film IMPRESSION: No radiographic evidence of  acute cardiopulmonary disease. 6 mm nodule the left upper lobe which was identified on the prior CT is not well visualized. As previously noted, a six-month follow-up chest CT recommended in accordance with updated Fleischner Society guidelines. Electronically Signed   By: Corrie Mckusick D.O.   On: 01/21/2017 11:23   Dg Lumbar Spine 2-3 Views  Result Date: 01/22/2017 CLINICAL DATA:  Intraoperative localization EXAM: DG C-ARM 61-120 MIN; LUMBAR SPINE - 2-3 VIEW COMPARISON:  01/09/2017 FLUOROSCOPY TIME:  Fluoroscopy Time:  19 seconds Radiation Exposure Index (if provided by the fluoroscopic device): Not available Number of Acquired Spot Images: 2 FINDINGS: Intraoperative film shows surgical instruments posterior to the L5 vertebral body just below the L4-5 interspace. IMPRESSION:  Intraoperative localization as described. Electronically Signed   By: Inez Catalina M.D.   On: 01/22/2017 16:49   Ct Chest Wo Contrast  Result Date: 12/27/2016 CLINICAL DATA:  Chronic cough. Smoker. Left lower lobe nodule on a recent abdomen CT. EXAM: CT CHEST WITHOUT CONTRAST TECHNIQUE: Multidetector CT imaging of the chest was performed following the standard protocol without IV contrast. COMPARISON:  Abdomen CT dated 12/02/2016. Chest radiographs dated 11/26/2016. FINDINGS: Cardiovascular: No significant vascular findings. Normal heart size. No pericardial effusion. Mediastinum/Nodes: No enlarged mediastinal or axillary lymph nodes. Thyroid gland, trachea, and esophagus demonstrate no significant findings. Lungs/Pleura: 8.5 x 3.9 mm nodular density in the posterior aspect of the left upper lobe on image number 30 series 4. The 12.5 mm nodular focus seen on 12/02/2016 is currently shown to be part of an elongated, linear density in the left lower lobe extending inferiorly and anteriorly from the previously demonstrated old, healed left posterior rib deformities. No true nodule is present at this location today. No pleural fluid. Upper Abdomen: Unremarkable. Musculoskeletal: Old, healed fracture deformities of multiple left posterior and lateral ribs. Mild thoracic spine degenerative changes. IMPRESSION: 1. 6.2 mm average diameter left upper lobe nodule. Non-contrast chest CT at 6-12 months is recommended. If the nodule is stable at time of repeat CT, then future CT at 18-24 months (from today's scan) is considered optional for low-risk patients, but is recommended for high-risk patients. This recommendation follows the consensus statement: Guidelines for Management of Incidental Pulmonary Nodules Detected on CT Images: From the Fleischner Society 2017; Radiology 2017; 284:228-243. 2. Multiple old, healed left rib fractures with associated left lower lobe scarring. Electronically Signed   By: Claudie Revering M.D.    On: 12/27/2016 15:20   Ct Lumbar Spine W Contrast  Result Date: 01/09/2017 CLINICAL DATA:  Right leg pain and numbness. EXAM: LUMBAR MYELOGRAM FLUOROSCOPY TIME:  0 minutes 30 seconds. 277.61 micro gray meter squared PROCEDURE: After thorough discussion of risks and benefits of the procedure including bleeding, infection, injury to nerves, blood vessels, adjacent structures as well as headache and CSF leak, written and oral informed consent was obtained. Consent was obtained by Dr. Nelson Chimes. Time out form was completed. Patient was positioned prone on the fluoroscopy table. Local anesthesia was provided with 1% lidocaine without epinephrine after prepped and draped in the usual sterile fashion. Puncture was performed at L5-S1 using a 5 inch 22-gauge spinal needle via right para median approach. Using a single pass through the dura, the needle was placed within the thecal sac, with return of clear CSF. 15 mL of Isovue M-200 was injected into the thecal sac, with normal opacification of the nerve roots and cauda equina consistent with free flow within the subarachnoid space. I personally performed the lumbar puncture  and administered the intrathecal contrast. I also personally performed acquisition of the myelogram images. TECHNIQUE: Contiguous axial images were obtained through the Lumbar spine after the intrathecal infusion of infusion. Coronal and sagittal reconstructions were obtained of the axial image sets. COMPARISON:  MRI 07/10/2016 FINDINGS: LUMBAR MYELOGRAM FINDINGS: Small anterior extradural defect at L4-5. Mild left lateral recess stenosis with some diminished filling of the left L5 root sleeve. No right-sided neural compression seen. Standing flexion extension views show slight exaggeration of disc bulges at L1-2, L2-3 and L3-4. No abnormal motion occurs with flexion extension. CT LUMBAR MYELOGRAM FINDINGS: Normal appearance by CT at L3-4 and above. No stenosis or neural compression. L4-5:  Endplate osteophytes and shallow protrusion of the disc more prominent in the left posterolateral direction. Mild facet hypertrophy. Left lateral recess narrowing that could affect the left L5 nerve. 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 No visible disc pathology. No apparent neural compression. Facet osteoarthritis right worse than left with pronounced sclerotic change on the right which could contribute to pain. IMPRESSION: L4-5: Endplate osteophytes and shallow protrusion of the disc more towards the left. Mild facet hypertrophy. Left lateral recess narrowing that could possibly affect the left L5 nerve. 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 No stenosis or neural compression. Sacroiliac osteoarthritis right worse than left with pronounced sclerotic change on the right which could be symptomatic. Electronically Signed   By: Nelson Chimes M.D.   On: 01/09/2017 11:28   Dg C-arm 1-60 Min  Result Date: 01/22/2017 CLINICAL DATA:  Intraoperative localization EXAM: DG C-ARM 61-120 MIN; LUMBAR SPINE - 2-3 VIEW COMPARISON:  01/09/2017 FLUOROSCOPY TIME:  Fluoroscopy Time:  19 seconds Radiation Exposure Index (if provided by the fluoroscopic device): Not available Number of Acquired Spot Images: 2 FINDINGS: Intraoperative film shows surgical instruments posterior to the L5 vertebral body just below the L4-5 interspace. IMPRESSION: Intraoperative localization as described. Electronically Signed   By: Inez Catalina M.D.   On: 01/22/2017 16:49   Dg Myelography Lumbar Inj Lumbosacral  Result Date: 01/09/2017 CLINICAL DATA:  Right leg pain and numbness. EXAM: LUMBAR MYELOGRAM FLUOROSCOPY TIME:  0 minutes 30 seconds. 277.61 micro gray meter squared PROCEDURE: After thorough discussion of risks and  benefits of the procedure including bleeding, infection, injury to nerves, blood vessels, adjacent structures as well as headache and CSF leak, written and oral informed consent was obtained. Consent was obtained by Dr. Nelson Chimes. Time out form was completed. Patient was positioned prone on the fluoroscopy table. Local anesthesia was provided with 1% lidocaine without epinephrine after prepped and draped in the usual sterile fashion. Puncture was performed at L5-S1 using a 5 inch 22-gauge spinal needle via right para median approach. Using a single pass through the dura, the needle was placed within the thecal sac, with return of clear CSF. 15 mL of Isovue M-200 was injected into the thecal sac, with normal opacification of the nerve roots and cauda equina consistent with free flow within the subarachnoid space. I personally performed the lumbar puncture and administered the intrathecal contrast. I also personally performed acquisition of the myelogram images. TECHNIQUE: Contiguous axial images were obtained through the Lumbar spine after the intrathecal infusion of infusion. Coronal and sagittal reconstructions were obtained of the axial image sets. COMPARISON:  MRI 07/10/2016 FINDINGS: LUMBAR MYELOGRAM FINDINGS: Small anterior extradural defect at L4-5.  Mild left lateral recess stenosis with some diminished filling of the left L5 root sleeve. No right-sided neural compression seen. Standing flexion extension views show slight exaggeration of disc bulges at L1-2, L2-3 and L3-4. No abnormal motion occurs with flexion extension. CT LUMBAR MYELOGRAM FINDINGS: Normal appearance by CT at L3-4 and above. No stenosis or neural compression. L4-5: Endplate osteophytes and shallow protrusion of the disc more prominent in the left posterolateral direction. Mild facet hypertrophy. Left lateral recess narrowing that could affect the left L5 nerve. 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 No visible disc pathology. No apparent neural compression. Facet osteoarthritis right worse than left with pronounced sclerotic change on the right which could contribute to pain. IMPRESSION: L4-5: Endplate osteophytes and shallow protrusion of the disc more towards the left. Mild facet hypertrophy. Left lateral recess narrowing that could possibly affect the left L5 nerve. 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 No stenosis or neural compression. Sacroiliac osteoarthritis right worse than left with pronounced sclerotic change on the right which could be symptomatic. Electronically Signed   By: Nelson Chimes M.D.   On: 01/09/2017 11:28    Antibiotics:  Anti-infectives (From admission, onward)   Start     Dose/Rate Route Frequency Ordered Stop   01/23/17 0500  vancomycin (VANCOCIN) IVPB 1000 mg/200 mL premix     1,000 mg 200 mL/hr over 60 Minutes Intravenous  Once 01/22/17 1814 01/23/17 0552   01/22/17 1550  bacitracin 50,000 Units in sodium chloride irrigation 0.9 % 500 mL irrigation  Status:  Discontinued       As needed 01/22/17 1550 01/22/17 1658   01/22/17 1200  vancomycin (VANCOCIN) 1,500 mg in sodium chloride 0.9 % 500 mL IVPB     1,500 mg 250 mL/hr over 120 Minutes Intravenous To Short Stay 01/21/17 0924 01/22/17 1553      Discharge Exam: Blood pressure 123/74, pulse 78, temperature 97.7 F (36.5 C), temperature source Oral, resp. rate 18, height 5' 8.5" (1.74 m), weight 112 kg (246 lb 14.4 oz), SpO2 96 %. Neurologic: Grossly normal incision cdi  Discharge Medications:   Allergies as of 01/23/2017      Reactions   Chantix [varenicline] Other (See Comments)   Nightmares   Penicillins Other (See Comments)   Unknown Has patient had a PCN reaction causing immediate rash, facial/tongue/throat  swelling, SOB or lightheadedness with hypotension: Unknown Has patient had a PCN reaction causing severe rash involving mucus membranes or skin necrosis: Unknown Has patient had a PCN reaction that required hospitalization: Unknown Has patient had a PCN reaction occurring within the last 10 years: No If all of the above answers are "NO", then may proceed with Cephalosporin use.   Latex Rash      Medication List    TAKE these medications   acetaminophen 650 MG CR tablet Commonly known as:  TYLENOL Take 1,300 mg by mouth 2 (two) times daily as needed for pain.   ALPRAZolam 0.5 MG tablet Commonly known as:  XANAX Take 1 and 1/2 tablets QHS What changed:    how much to take  how to take this  when to take this  additional instructions   ARIPiprazole 5 MG tablet Commonly known as:  ABILIFY Take 2 tablets (10 mg total) by mouth at bedtime.   cyclobenzaprine 10 MG tablet Commonly known as:  FLEXERIL Take 1 tablet (10 mg total) by mouth 3 (three) times daily as needed for muscle spasms. What changed:  when to take this   HYDROcodone-acetaminophen 7.5-325 MG tablet Commonly known as:  NORCO Take 1 tablet by mouth every 6 (six) hours.   linaclotide 145 MCG Caps capsule Commonly known as:  LINZESS Take 1 capsule (145 mcg total) by mouth daily before breakfast.   VENTOLIN HFA 108 (90 Base) MCG/ACT inhaler Generic drug:  albuterol INHALE 1 TO 2 PUFFS BY MOUTH EVERY 6 HOURS AS NEEDED FOR WHEEZING OR SHORTNESS OF BREATH What changed:  See the new instructions.       Disposition: home   Final Dx: lumbar fusion L4S1  Discharge Instructions     Remove dressing in 72 hours   Complete by:  As directed    Call MD for:  difficulty breathing, headache or visual disturbances   Complete by:  As directed    Call MD for:  persistant nausea and vomiting   Complete by:  As directed    Call MD for:  redness, tenderness, or signs of infection (pain, swelling, redness, odor or  green/yellow discharge around incision site)   Complete by:  As directed    Call MD for:  severe uncontrolled pain   Complete by:  As directed    Call MD for:  temperature >100.4   Complete by:  As directed    Diet - low sodium heart healthy   Complete by:  As directed    Increase activity slowly   Complete by:  As directed       Follow-up Information    Eustace Moore, MD. Schedule an appointment as soon as possible for a visit in 2 week(s).   Specialty:  Neurosurgery Contact information: [address] [place] [address] [phone]            Signed: Eustace Moore 01/23/2017, 7:32 AM

## 2017-01-23 NOTE — Anesthesia Postprocedure Evaluation (Signed)
Anesthesia Post Note  Patient: Shelby [name]  Procedure(s) Performed: Posterior lateral Lumbar  Fusion Lumbar Four-Five Lumbar Five- Sacral one (N/A Back)     Patient location during evaluation: PACU Anesthesia Type: General Level of consciousness: awake and alert Pain management: pain level controlled Vital Signs Assessment: post-procedure vital signs reviewed and stable Respiratory status: spontaneous breathing, nonlabored ventilation and respiratory function stable Cardiovascular status: blood pressure returned to baseline and stable Postop Assessment: no apparent nausea or vomiting Anesthetic complications: no    Last Vitals:  Vitals:   01/23/17 0000 01/23/17 0400  BP: 114/67 123/74  Pulse: 77 78  Resp: 18 18  Temp: 36.8 C 36.5 C  SpO2: 94% 96%    Last Pain:  Vitals:   01/23/17 0539  TempSrc:   PainSc: Asleep                 Lynda Rainwater

## 2017-01-23 NOTE — Progress Notes (Signed)
Pt doing well. Pt and husband given D/C instructions with Rx's, verbal understanding was provided. Pt's incision is clean and dry with no sign of infection. Pt's IV was removed prior to D/C. Pt D/C'd home via wheelchair @ 1110 per MD order. Pt is stable @ D/C and has no other needs at this time. Holli Humbles, RN

## 2017-01-23 NOTE — Plan of Care (Signed)
  Progressing Safety: Ability to remain free from injury will improve 01/23/2017 0441 - Progressing by Jonnie Finner, RN Activity: Ability to avoid complications of mobility impairment will improve 01/23/2017 0441 - Progressing by Jonnie Finner, RN Ability to tolerate increased activity will improve 01/23/2017 0441 - Progressing by Jonnie Finner, RN Will remain free from falls 01/23/2017 0441 - Progressing by Jonnie Finner, RN Education: Knowledge of the prescribed therapeutic regimen will improve 01/23/2017 0441 - Progressing by Jonnie Finner, RN Understanding of discharge needs will improve 01/23/2017 0441 - Progressing by Jonnie Finner, RN Physical Regulation: Ability to maintain clinical measurements within normal limits will improve 01/23/2017 0441 - Progressing by Jonnie Finner, RN Postoperative complications will be avoided or minimized 01/23/2017 0441 - Progressing by Jonnie Finner, RN Diagnostic test results will improve 01/23/2017 0441 - Progressing by Jonnie Finner, RN Skin Integrity: Signs of wound healing will improve 01/23/2017 0441 - Progressing by Jonnie Finner, RN Health Behavior/Discharge Planning: Identification of resources available to assist in meeting health care needs will improve 01/23/2017 0441 - Progressing by Jonnie Finner, RN

## 2017-02-14 ENCOUNTER — Telehealth: Payer: Self-pay | Admitting: *Deleted

## 2017-02-14 ENCOUNTER — Ambulatory Visit: Payer: Medicare Other | Admitting: Neurology

## 2017-02-14 NOTE — Telephone Encounter (Signed)
Patient no show new pt appt on 02/14/2017

## 2017-02-17 ENCOUNTER — Encounter: Payer: Self-pay | Admitting: Neurology

## 2017-02-21 ENCOUNTER — Ambulatory Visit: Payer: Medicare Other | Admitting: Family Medicine

## 2017-02-27 DIAGNOSIS — R102 Pelvic and perineal pain: Secondary | ICD-10-CM | POA: Diagnosis not present

## 2017-03-04 DIAGNOSIS — R102 Pelvic and perineal pain: Secondary | ICD-10-CM | POA: Diagnosis not present

## 2017-03-04 DIAGNOSIS — D259 Leiomyoma of uterus, unspecified: Secondary | ICD-10-CM | POA: Diagnosis not present

## 2017-03-24 DIAGNOSIS — Z6839 Body mass index (BMI) 39.0-39.9, adult: Secondary | ICD-10-CM | POA: Diagnosis not present

## 2017-03-24 DIAGNOSIS — R9389 Abnormal findings on diagnostic imaging of other specified body structures: Secondary | ICD-10-CM | POA: Diagnosis not present

## 2017-03-24 DIAGNOSIS — R030 Elevated blood-pressure reading, without diagnosis of hypertension: Secondary | ICD-10-CM | POA: Diagnosis not present

## 2017-03-24 DIAGNOSIS — M545 Low back pain: Secondary | ICD-10-CM | POA: Diagnosis not present

## 2017-03-25 ENCOUNTER — Other Ambulatory Visit: Payer: Self-pay | Admitting: Neurological Surgery

## 2017-03-25 DIAGNOSIS — R9389 Abnormal findings on diagnostic imaging of other specified body structures: Secondary | ICD-10-CM

## 2017-03-26 ENCOUNTER — Ambulatory Visit
Admission: RE | Admit: 2017-03-26 | Discharge: 2017-03-26 | Disposition: A | Discharge status: Home / Self Care | Payer: Medicare Other | Source: Ambulatory Visit | Attending: Neurological Surgery | Admitting: Neurological Surgery

## 2017-03-26 DIAGNOSIS — R911 Solitary pulmonary nodule: Secondary | ICD-10-CM | POA: Diagnosis not present

## 2017-03-26 DIAGNOSIS — R9389 Abnormal findings on diagnostic imaging of other specified body structures: Secondary | ICD-10-CM

## 2017-03-26 MED ORDER — IOPAMIDOL (ISOVUE-300) INJECTION 61%
75.0000 mL | Freq: Once | INTRAVENOUS | Status: AC | PRN
  Administered 2017-03-26: 75 mL via INTRAVENOUS

## 2017-04-14 ENCOUNTER — Ambulatory Visit (INDEPENDENT_AMBULATORY_CARE_PROVIDER_SITE_OTHER): Payer: Medicare Other | Admitting: Family Medicine

## 2017-04-14 ENCOUNTER — Encounter: Payer: Self-pay | Admitting: Family Medicine

## 2017-04-14 VITALS — BP 116/77 | HR 84 | Temp 96.7°F | Ht 68.5 in | Wt 251.5 lb

## 2017-04-14 DIAGNOSIS — F331 Major depressive disorder, recurrent, moderate: Secondary | ICD-10-CM | POA: Diagnosis not present

## 2017-04-14 MED ORDER — FLUTICASONE FUROATE-VILANTEROL 100-25 MCG/INH IN AEPB: 1.0000 | INHALATION_SPRAY | Freq: Every day | RESPIRATORY_TRACT | 2 refills | Status: DC

## 2017-04-14 MED ORDER — ALBUTEROL SULFATE HFA 108 (90 BASE) MCG/ACT IN AERS: INHALATION_SPRAY | RESPIRATORY_TRACT | 5 refills | Status: DC

## 2017-04-14 MED ORDER — ARIPIPRAZOLE 5 MG PO TABS: 10.0000 mg | ORAL_TABLET | Freq: Every day | ORAL | 0 refills | Status: DC

## 2017-04-14 MED ORDER — ALPRAZOLAM 0.5 MG PO TABS: ORAL_TABLET | ORAL | 2 refills | Status: DC

## 2017-04-14 NOTE — Progress Notes (Signed)
Subjective:  Patient ID: Shelby [name], female    DOB: [birth year]  Age: 42 y.o. MRN: [id number]  CC: Depression (pt here today for routine follow up of her depression)   HPI Shelby [name] presents for recheck of depression, anxiety   Depression screen [facility] 2/9 04/14/2017 01/13/2017 12/12/2016  Decreased Interest 2 1 2   Down, Depressed, Hopeless 1 1 2   PHQ - 2 Score 3 2 4   Altered sleeping 0 0 1  Tired, decreased energy 2 1 1   Change in appetite 1 0 0  Feeling bad or failure about yourself  0 0 0  Trouble concentrating 1 0 1  Moving slowly or fidgety/restless 0 0 0  Suicidal thoughts 0 0 0  PHQ-9 Score 7 3 7   Difficult doing work/chores - - -  Some recent data might be hidden    History Shelby [name] has a past medical history of Anxiety, Arthritis, Asthma, Bad memory, Bipolar disorder ([place]), Depression, Endometriosis, Migraines, and Traumatic brain injury ([place]) (2001).   She has a past surgical history that includes Cesarean section; cyst remove; uterine ablation; Tubal ligation; Umbilical hernia repair (09/20/2011); Incisional hernia repair (12/27/2011); Wound debridement (12/27/2011); Insertion of mesh (12/27/2011); and Hand surgery.   Her family history includes Alzheimer's disease in her maternal grandmother and paternal grandmother; Colon cancer in her paternal grandfather; Diabetes in her father.She reports that she has been smoking cigarettes.  She has a 10.00 pack-year smoking history. she has never used smokeless tobacco. She reports that she does not drink alcohol or use drugs.  Allergies as of 04/14/2017      Reactions   Chantix [varenicline] Other (See Comments)   Nightmares   Penicillins Other (See Comments)   Unknown Has patient had a PCN reaction causing immediate rash, facial/tongue/throat swelling, SOB or lightheadedness with hypotension: Unknown Has patient had a PCN reaction causing severe rash involving mucus membranes or skin necrosis: Unknown Has patient had a  PCN reaction that required hospitalization: Unknown Has patient had a PCN reaction occurring within the last 10 years: No If all of the above answers are "NO", then may proceed with Cephalosporin use.   Latex Rash      Medication List        Accurate as of 04/14/17  9:48 PM. Always use your most recent med list.          acetaminophen 650 MG CR tablet Commonly known as:  TYLENOL Take 1,300 mg by mouth 2 (two) times daily as needed for pain.   albuterol 108 (90 Base) MCG/ACT inhaler Commonly known as:  VENTOLIN HFA INHALE  2 PUFFS BY MOUTH EVERY 6 HOURS AS NEEDED FOR WHEEZING OR SHORTNESS OF BREATH   ALPRAZolam 0.5 MG tablet Commonly known as:  XANAX Take 1 and 1/2 tablets QHS   ARIPiprazole 5 MG tablet Commonly known as:  ABILIFY Take 2 tablets (10 mg total) by mouth at bedtime.   cyclobenzaprine 10 MG tablet Commonly known as:  FLEXERIL Take 1 tablet (10 mg total) by mouth 3 (three) times daily as needed for muscle spasms.   fluticasone furoate-vilanterol 100-25 MCG/INH Aepb Commonly known as:  BREO ELLIPTA Inhale 1 puff into the lungs daily.   HYDROcodone-acetaminophen 7.5-325 MG tablet Commonly known as:  NORCO Take 1 tablet by mouth every 6 (six) hours.   linaclotide 145 MCG Caps capsule Commonly known as:  LINZESS Take 1 capsule (145 mcg total) by mouth daily before breakfast.        ROS  Review of Systems  Constitutional: Negative for activity change, appetite change and fever.  HENT: Negative for congestion, rhinorrhea and sore throat.   Eyes: Negative for visual disturbance.  Respiratory: Negative for cough and shortness of breath.   Cardiovascular: Negative for chest pain and palpitations.  Gastrointestinal: Negative for abdominal pain, diarrhea and nausea.  Genitourinary: Negative for dysuria.  Musculoskeletal: Negative for arthralgias and myalgias.    Objective:  BP 116/77   Pulse 84   Temp (!) 96.7 F (35.9 C) (Oral)   Ht 5' 8.5" (1.74 m)    Wt 251 lb 8 oz (114.1 kg)   BMI 37.68 kg/m   BP Readings from Last 3 Encounters:  04/14/17 116/77  01/23/17 109/61  01/21/17 123/77    Wt Readings from Last 3 Encounters:  04/14/17 251 lb 8 oz (114.1 kg)  01/22/17 246 lb 14.4 oz (112 kg)  01/21/17 246 lb 14.4 oz (112 kg)     Physical Exam  Constitutional: She is oriented to person, place, and time. She appears well-developed and well-nourished. No distress.  Cardiovascular: Normal rate and regular rhythm.  Pulmonary/Chest: Breath sounds normal.  Neurological: She is alert and oriented to person, place, and time.  Skin: Skin is warm and dry.  Psychiatric: She has a normal mood and affect.      Assessment & Plan:   Shelby [name] was seen today for depression.  Diagnoses and all orders for this visit:  Moderate episode of recurrent major depressive disorder ([place])  Other orders -     ARIPiprazole (ABILIFY) 5 MG tablet; Take 2 tablets (10 mg total) by mouth at bedtime. -     albuterol (VENTOLIN HFA) 108 (90 Base) MCG/ACT inhaler; INHALE  2 PUFFS BY MOUTH EVERY 6 HOURS AS NEEDED FOR WHEEZING OR SHORTNESS OF BREATH -     fluticasone furoate-vilanterol (BREO ELLIPTA) 100-25 MCG/INH AEPB; Inhale 1 puff into the lungs daily. -     ALPRAZolam (XANAX) 0.5 MG tablet; Take 1 and 1/2 tablets QHS       I have changed Dolora M. Fischman's VENTOLIN HFA to albuterol. I am also having her start on fluticasone furoate-vilanterol. Additionally, I am having her maintain her linaclotide, acetaminophen, cyclobenzaprine, HYDROcodone-acetaminophen, ARIPiprazole, and ALPRAZolam.  Allergies as of 04/14/2017      Reactions   Chantix [varenicline] Other (See Comments)   Nightmares   Penicillins Other (See Comments)   Unknown Has patient had a PCN reaction causing immediate rash, facial/tongue/throat swelling, SOB or lightheadedness with hypotension: Unknown Has patient had a PCN reaction causing severe rash involving mucus membranes or skin necrosis:  Unknown Has patient had a PCN reaction that required hospitalization: Unknown Has patient had a PCN reaction occurring within the last 10 years: No If all of the above answers are "NO", then may proceed with Cephalosporin use.   Latex Rash      Medication List        Accurate as of 04/14/17  9:48 PM. Always use your most recent med list.          acetaminophen 650 MG CR tablet Commonly known as:  TYLENOL Take 1,300 mg by mouth 2 (two) times daily as needed for pain.   albuterol 108 (90 Base) MCG/ACT inhaler Commonly known as:  VENTOLIN HFA INHALE  2 PUFFS BY MOUTH EVERY 6 HOURS AS NEEDED FOR WHEEZING OR SHORTNESS OF BREATH   ALPRAZolam 0.5 MG tablet Commonly known as:  XANAX Take 1 and 1/2 tablets QHS   ARIPiprazole 5  MG tablet Commonly known as:  ABILIFY Take 2 tablets (10 mg total) by mouth at bedtime.   cyclobenzaprine 10 MG tablet Commonly known as:  FLEXERIL Take 1 tablet (10 mg total) by mouth 3 (three) times daily as needed for muscle spasms.   fluticasone furoate-vilanterol 100-25 MCG/INH Aepb Commonly known as:  BREO ELLIPTA Inhale 1 puff into the lungs daily.   HYDROcodone-acetaminophen 7.5-325 MG tablet Commonly known as:  NORCO Take 1 tablet by mouth every 6 (six) hours.   linaclotide 145 MCG Caps capsule Commonly known as:  LINZESS Take 1 capsule (145 mcg total) by mouth daily before breakfast.        Follow-up: Return in about 3 months (around 07/15/2017).  Claretta Fraise, M.D.

## 2017-04-15 ENCOUNTER — Ambulatory Visit: Payer: Medicare Other | Admitting: *Deleted

## 2017-04-21 ENCOUNTER — Other Ambulatory Visit: Payer: Self-pay | Admitting: Gastroenterology

## 2017-04-23 ENCOUNTER — Ambulatory Visit (INDEPENDENT_AMBULATORY_CARE_PROVIDER_SITE_OTHER): Payer: Medicare Other | Admitting: *Deleted

## 2017-04-23 ENCOUNTER — Encounter: Payer: Self-pay | Admitting: *Deleted

## 2017-04-23 VITALS — BP 127/84 | HR 84 | Ht 65.5 in | Wt 253.0 lb

## 2017-04-23 DIAGNOSIS — Z0000 Encounter for general adult medical examination without abnormal findings: Secondary | ICD-10-CM | POA: Diagnosis not present

## 2017-04-23 NOTE — Patient Instructions (Signed)
  Ms. Louisa , Thank you for taking time to come for your Medicare Wellness Visit. I appreciate your ongoing commitment to your health goals. Please review the following plan we discussed and let me know if I can assist you in the future.   These are the goals we discussed: Goals    . Exercise 150 min/wk Moderate Activity       This is a list of the screening recommended for you and due dates:  Health Maintenance  Topic Date Due  . HIV Screening  02/08/1991  . Mammogram  08/06/2017  . Pap Smear  07/23/2019  . Tetanus Vaccine  06/21/2026  . Flu Shot  Completed
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
## 2017-04-30 DIAGNOSIS — R102 Pelvic and perineal pain: Secondary | ICD-10-CM | POA: Diagnosis not present

## 2017-05-13 ENCOUNTER — Telehealth: Payer: Self-pay | Admitting: Internal Medicine

## 2017-05-13 NOTE — Telephone Encounter (Signed)
Patient had CT chest with contrast 03/2017 by another provider. FYI to LSL.

## 2017-05-13 NOTE — Telephone Encounter (Signed)
Called spoke with pt. She is aware of below. Nothing further needed

## 2017-05-13 NOTE — Telephone Encounter (Signed)
Recall for ct chest °

## 2017-05-13 NOTE — Telephone Encounter (Signed)
Noted. Please let patient know to follow up with other provider for future management of pulmonary nodule, radiologist reports stable but recommending 18-24 follow up CT.

## 2017-05-20 ENCOUNTER — Other Ambulatory Visit: Payer: Self-pay | Admitting: Family Medicine

## 2017-05-21 NOTE — Telephone Encounter (Signed)
Last seen 04/14/17  Dr Livia Snellen

## 2017-05-26 DIAGNOSIS — F17210 Nicotine dependence, cigarettes, uncomplicated: Secondary | ICD-10-CM | POA: Diagnosis present

## 2017-05-26 DIAGNOSIS — D259 Leiomyoma of uterus, unspecified: Secondary | ICD-10-CM | POA: Diagnosis present

## 2017-05-26 DIAGNOSIS — G8929 Other chronic pain: Secondary | ICD-10-CM | POA: Diagnosis present

## 2017-05-26 DIAGNOSIS — Z888 Allergy status to other drugs, medicaments and biological substances status: Secondary | ICD-10-CM | POA: Diagnosis not present

## 2017-05-26 DIAGNOSIS — J45909 Unspecified asthma, uncomplicated: Secondary | ICD-10-CM | POA: Diagnosis present

## 2017-05-26 DIAGNOSIS — Z79899 Other long term (current) drug therapy: Secondary | ICD-10-CM | POA: Diagnosis not present

## 2017-05-26 DIAGNOSIS — F329 Major depressive disorder, single episode, unspecified: Secondary | ICD-10-CM | POA: Diagnosis present

## 2017-05-26 DIAGNOSIS — Z880 Allergy status to penicillin: Secondary | ICD-10-CM | POA: Diagnosis not present

## 2017-05-26 DIAGNOSIS — Z91040 Latex allergy status: Secondary | ICD-10-CM | POA: Diagnosis not present

## 2017-05-26 DIAGNOSIS — R102 Pelvic and perineal pain: Secondary | ICD-10-CM | POA: Diagnosis present

## 2017-05-26 DIAGNOSIS — D251 Intramural leiomyoma of uterus: Secondary | ICD-10-CM | POA: Diagnosis not present

## 2017-06-09 DIAGNOSIS — R358 Other polyuria: Secondary | ICD-10-CM | POA: Diagnosis not present

## 2017-06-11 HISTORY — PX: ABDOMINAL HYSTERECTOMY: SHX81

## 2017-06-25 DIAGNOSIS — M5416 Radiculopathy, lumbar region: Secondary | ICD-10-CM | POA: Diagnosis not present

## 2017-07-03 ENCOUNTER — Other Ambulatory Visit (HOSPITAL_COMMUNITY): Payer: Self-pay | Admitting: Unknown Physician Specialty

## 2017-07-03 DIAGNOSIS — Z1231 Encounter for screening mammogram for malignant neoplasm of breast: Secondary | ICD-10-CM

## 2017-07-04 ENCOUNTER — Other Ambulatory Visit: Payer: Self-pay | Admitting: Family Medicine

## 2017-07-15 ENCOUNTER — Encounter: Payer: Self-pay | Admitting: Family Medicine

## 2017-07-15 ENCOUNTER — Ambulatory Visit (INDEPENDENT_AMBULATORY_CARE_PROVIDER_SITE_OTHER): Payer: Medicare Other | Admitting: Family Medicine

## 2017-07-15 VITALS — BP 111/72 | HR 86 | Temp 97.4°F | Ht 65.5 in | Wt 255.2 lb

## 2017-07-15 DIAGNOSIS — F419 Anxiety disorder, unspecified: Secondary | ICD-10-CM | POA: Diagnosis not present

## 2017-07-15 DIAGNOSIS — Z136 Encounter for screening for cardiovascular disorders: Secondary | ICD-10-CM | POA: Diagnosis not present

## 2017-07-15 DIAGNOSIS — R6889 Other general symptoms and signs: Secondary | ICD-10-CM | POA: Diagnosis not present

## 2017-07-15 DIAGNOSIS — F331 Major depressive disorder, recurrent, moderate: Secondary | ICD-10-CM

## 2017-07-15 DIAGNOSIS — Z13220 Encounter for screening for lipoid disorders: Secondary | ICD-10-CM | POA: Diagnosis not present

## 2017-07-15 MED ORDER — HYDROXYZINE HCL 25 MG PO TABS: ORAL_TABLET | ORAL | 5 refills | Status: DC

## 2017-07-15 MED ORDER — ARIPIPRAZOLE 5 MG PO TABS: 15.0000 mg | ORAL_TABLET | Freq: Every day | ORAL | 0 refills | Status: DC

## 2017-07-15 MED ORDER — ALPRAZOLAM 0.5 MG PO TABS: ORAL_TABLET | ORAL | 2 refills | Status: DC

## 2017-07-15 NOTE — Progress Notes (Signed)
Subjective:  Patient ID: Shelby [name], female    DOB: 06-30-1975  Age: 42 y.o. MRN: [id number]  CC: Medical Management of Chronic Issues   HPI Shelby [name] presents for feelings of being overwhelmed. She is constantly stressed out  As well.   Depression screen [facility] 2/9 07/15/2017 04/14/2017 01/13/2017  Decreased Interest 0 2 1  Down, Depressed, Hopeless 0 1 1  PHQ - 2 Score 0 3 2  Altered sleeping - 0 0  Tired, decreased energy - 2 1  Change in appetite - 1 0  Feeling bad or failure about yourself  - 0 0  Trouble concentrating - 1 0  Moving slowly or fidgety/restless - 0 0  Suicidal thoughts - 0 0  PHQ-9 Score - 7 3  Difficult doing work/chores - - -  Some recent data might be hidden    History Shelby [name] has a past medical history of Anxiety, Arthritis, Asthma, Bad memory, Bipolar disorder ([place]), Chronic kidney disease, Depression, Endometriosis, Migraines, and Traumatic brain injury ([place]) (2001).   She has a past surgical history that includes Cesarean section; cyst remove; uterine ablation; Tubal ligation; Umbilical hernia repair (09/20/2011); Incisional hernia repair (12/27/2011); Wound debridement (12/27/2011); Insertion of mesh (12/27/2011); Hand surgery; and Abdominal hysterectomy (06/2017).   Her family history includes Alzheimer's disease in her maternal grandmother and paternal grandmother; Colon cancer in her paternal grandfather; Diabetes in her father; Healthy in her daughter and daughter.She reports that she has been smoking cigarettes.  She has a 25.00 pack-year smoking history. She has never used smokeless tobacco. She reports that she does not drink alcohol or use drugs.    ROS Review of Systems  Constitutional: Negative.   HENT: Negative.   Eyes: Negative for visual disturbance.  Respiratory: Negative for shortness of breath.   Cardiovascular: Negative for chest pain.  Gastrointestinal: Negative for abdominal pain.  Musculoskeletal: Negative for  arthralgias.  Psychiatric/Behavioral: Positive for decreased concentration and dysphoric mood. The patient is nervous/anxious.     Objective:  BP 111/72   Pulse 86   Temp (!) 97.4 F (36.3 C) (Oral)   Ht 5' 5.5" (1.664 m)   Wt 255 lb 4 oz (115.8 kg)   BMI 41.83 kg/m   BP Readings from Last 3 Encounters:  07/18/17 120/76  07/15/17 111/72  04/23/17 127/84    Wt Readings from Last 3 Encounters:  07/18/17 256 lb (116.1 kg)  07/15/17 255 lb 4 oz (115.8 kg)  04/23/17 253 lb (114.8 kg)     Physical Exam  Constitutional: She is oriented to person, place, and time. She appears well-developed and well-nourished. No distress.  HENT:  Head: Normocephalic and atraumatic.  Eyes: Pupils are equal, round, and reactive to light. Conjunctivae are normal.  Neck: Normal range of motion. Neck supple. No thyromegaly present.  Cardiovascular: Normal rate, regular rhythm and normal heart sounds.  No murmur heard. Pulmonary/Chest: Effort normal and breath sounds normal. No respiratory distress. She has no wheezes. She has no rales.  Abdominal: Soft. Bowel sounds are normal. She exhibits no distension. There is no tenderness.  Musculoskeletal: Normal range of motion.  Lymphadenopathy:    She has no cervical adenopathy.  Neurological: She is alert and oriented to person, place, and time.  Skin: Skin is warm and dry.  Psychiatric: She has a normal mood and affect. Her behavior is normal. Thought content normal.      Assessment & Plan:   Shelby [name] was seen today for medical management of chronic  issues.  Diagnoses and all orders for this visit:  Moderate episode of recurrent major depressive disorder ([place]) -     CBC with Differential/Platelet -     CMP14+EGFR -     TSH  Screening for cholesterol level -     Lipid panel  Other orders -     ARIPiprazole (ABILIFY) 5 MG tablet; Take 3 tablets (15 mg total) by mouth at bedtime. -     hydrOXYzine (ATARAX/VISTARIL) 25 MG tablet; TAKE 1  TABLET(25 MG) BY MOUTH TID DAILY AS NEEDED FOR ANXIETY -     ALPRAZolam (XANAX) 0.5 MG tablet; Take 1 and 1/2 tablets QHS (Patient taking differently: Take 0.5 mg by mouth at bedtime. Take 1 and 1/2 tablets QHS)       I have discontinued Shelby [name]'s HYDROcodone-acetaminophen. I have also changed her ARIPiprazole and hydrOXYzine. Additionally, I am having her maintain her acetaminophen, cyclobenzaprine, albuterol, fluticasone furoate-vilanterol, LINZESS, Fluoxetine HCl (PMDD), and ALPRAZolam.  Allergies as of 07/15/2017      Reactions   Chantix [varenicline] Other (See Comments)   Nightmares   Penicillins Other (See Comments)   Unknown Has patient had a PCN reaction causing immediate rash, facial/tongue/throat swelling, SOB or lightheadedness with hypotension: Unknown Has patient had a PCN reaction causing severe rash involving mucus membranes or skin necrosis: Unknown Has patient had a PCN reaction that required hospitalization: Unknown Has patient had a PCN reaction occurring within the last 10 years: No If all of the above answers are "NO", then may proceed with Cephalosporin use.   Latex Rash      Medication List        Accurate as of 07/15/17 11:59 PM. Always use your most recent med list.          acetaminophen 650 MG CR tablet Commonly known as:  TYLENOL Take 1,300 mg by mouth 2 (two) times daily as needed for pain.   albuterol 108 (90 Base) MCG/ACT inhaler Commonly known as:  VENTOLIN HFA INHALE  2 PUFFS BY MOUTH EVERY 6 HOURS AS NEEDED FOR WHEEZING OR SHORTNESS OF BREATH   ALPRAZolam 0.5 MG tablet Commonly known as:  XANAX Take 1 and 1/2 tablets QHS   ARIPiprazole 5 MG tablet Commonly known as:  ABILIFY Take 3 tablets (15 mg total) by mouth at bedtime.   cyclobenzaprine 10 MG tablet Commonly known as:  FLEXERIL Take 1 tablet (10 mg total) by mouth 3 (three) times daily as needed for muscle spasms.   Fluoxetine HCl (PMDD) 20 MG Caps TAKE 3 CAPSULES BY  MOUTH EVERY DAY   fluticasone furoate-vilanterol 100-25 MCG/INH Aepb Commonly known as:  BREO ELLIPTA Inhale 1 puff into the lungs daily.   hydrOXYzine 25 MG tablet Commonly known as:  ATARAX/VISTARIL TAKE 1 TABLET(25 MG) BY MOUTH TID DAILY AS NEEDED FOR ANXIETY   LINZESS 145 MCG Caps capsule Generic drug:  linaclotide TAKE 1 CAPSULE(145 MCG) BY MOUTH DAILY BEFORE BREAKFAST        Follow-up: Return in about 6 months (around 01/14/2018).  Claretta Fraise, M.D.

## 2017-07-15 NOTE — Patient Instructions (Signed)

## 2017-07-16 LAB — LIPID PANEL
Chol/HDL Ratio: 5.8 ratio — ABNORMAL HIGH (ref 0.0–4.4)
Cholesterol, Total: 184 mg/dL (ref 100–199)
HDL: 32 mg/dL — ABNORMAL LOW (ref >39)
LDL Calculated: 112 mg/dL — ABNORMAL HIGH (ref 0–99)
Triglycerides: 201 mg/dL — ABNORMAL HIGH (ref 0–149)
VLDL CHOLESTEROL CAL: 40 mg/dL (ref 5–40)

## 2017-07-16 LAB — CMP14+EGFR
ALK PHOS: 92 IU/L (ref 39–117)
ALT: 18 IU/L (ref 0–32)
AST: 21 IU/L (ref 0–40)
Albumin/Globulin Ratio: 1.8 (ref 1.2–2.2)
Albumin: 4.2 g/dL (ref 3.5–5.5)
BILIRUBIN TOTAL: 0.2 mg/dL (ref 0.0–1.2)
BUN/Creatinine Ratio: 6 — ABNORMAL LOW (ref 9–23)
BUN: 4 mg/dL — AB (ref 6–24)
CHLORIDE: 103 mmol/L (ref 96–106)
CO2: 26 mmol/L (ref 20–29)
Calcium: 9.3 mg/dL (ref 8.7–10.2)
Creatinine, Ser: 0.72 mg/dL (ref 0.57–1.00)
GFR calc non Af Amer: 104 mL/min/{1.73_m2} (ref >59)
GFR, EST AFRICAN AMERICAN: 120 mL/min/{1.73_m2} (ref >59)
GLOBULIN, TOTAL: 2.4 g/dL (ref 1.5–4.5)
Glucose: 94 mg/dL (ref 65–99)
POTASSIUM: 4.1 mmol/L (ref 3.5–5.2)
Sodium: 143 mmol/L (ref 134–144)
TOTAL PROTEIN: 6.6 g/dL (ref 6.0–8.5)

## 2017-07-16 LAB — CBC WITH DIFFERENTIAL/PLATELET
BASOS: 1 %
Basophils Absolute: 0.1 10*3/uL (ref 0.0–0.2)
EOS (ABSOLUTE): 0.4 10*3/uL (ref 0.0–0.4)
Eos: 4 %
HEMOGLOBIN: 14.2 g/dL (ref 11.1–15.9)
Hematocrit: 42.6 % (ref 34.0–46.6)
IMMATURE GRANS (ABS): 0 10*3/uL (ref 0.0–0.1)
Immature Granulocytes: 0 %
LYMPHS ABS: 2.4 10*3/uL (ref 0.7–3.1)
LYMPHS: 25 %
MCH: 28.3 pg (ref 26.6–33.0)
MCHC: 33.3 g/dL (ref 31.5–35.7)
MCV: 85 fL (ref 79–97)
MONOCYTES: 6 %
Monocytes Absolute: 0.5 10*3/uL (ref 0.1–0.9)
NEUTROS ABS: 6.2 10*3/uL (ref 1.4–7.0)
Neutrophils: 64 %
Platelets: 360 10*3/uL (ref 150–450)
RBC: 5.02 x10E6/uL (ref 3.77–5.28)
RDW: 14.7 % (ref 12.3–15.4)
WBC: 9.7 10*3/uL (ref 3.4–10.8)

## 2017-07-16 LAB — TSH: TSH: 2.04 u[IU]/mL (ref 0.450–4.500)

## 2017-07-17 ENCOUNTER — Ambulatory Visit: Payer: Medicare Other | Admitting: Gastroenterology

## 2017-07-18 ENCOUNTER — Encounter: Payer: Self-pay | Admitting: *Deleted

## 2017-07-18 ENCOUNTER — Other Ambulatory Visit: Payer: Self-pay | Admitting: *Deleted

## 2017-07-18 ENCOUNTER — Encounter: Payer: Self-pay | Admitting: Internal Medicine

## 2017-07-18 ENCOUNTER — Ambulatory Visit (INDEPENDENT_AMBULATORY_CARE_PROVIDER_SITE_OTHER): Payer: Medicare Other | Admitting: Gastroenterology

## 2017-07-18 ENCOUNTER — Encounter: Payer: Self-pay | Admitting: Gastroenterology

## 2017-07-18 VITALS — BP 120/76 | HR 83 | Temp 97.0°F | Ht 68.0 in | Wt 256.0 lb

## 2017-07-18 DIAGNOSIS — K5900 Constipation, unspecified: Secondary | ICD-10-CM

## 2017-07-18 DIAGNOSIS — K625 Hemorrhage of anus and rectum: Secondary | ICD-10-CM | POA: Diagnosis not present

## 2017-07-18 DIAGNOSIS — K219 Gastro-esophageal reflux disease without esophagitis: Secondary | ICD-10-CM | POA: Diagnosis not present

## 2017-07-18 MED ORDER — PANTOPRAZOLE SODIUM 40 MG PO TBEC: 40.0000 mg | DELAYED_RELEASE_TABLET | Freq: Every day | ORAL | 3 refills | Status: DC

## 2017-07-18 MED ORDER — NA SULFATE-K SULFATE-MG SULF 17.5-3.13-1.6 GM/177ML PO SOLN: 1.0000 | ORAL | 0 refills | Status: DC

## 2017-07-18 NOTE — Assessment & Plan Note (Signed)
NO alarm symptoms. Weight increasing. Start Protonix once daily. Handout provided. Return in 4 months.

## 2017-07-18 NOTE — Progress Notes (Signed)
CC'D TO PCP °

## 2017-07-18 NOTE — Patient Instructions (Addendum)
I would like for you to stop Linzess. Instead, try Amitiza twice a day with food (to avoid nausea). We can adjust this dosage if needed. Let me know how this works, and I will send in a prescription!  We have scheduled you for a colonoscopy with Dr. Gala Romney.  I have also sent a reflux medication to your pharmacy called Protonix. Take this once each morning, 30 minutes before breakfast (it works best on an empty stomach). It may take up to 2 weeks to have full affect.   We will see you in about 4 months!  It was a pleasure to see you today. I strive to create trusting relationships with patients to provide genuine, compassionate, and quality care. I value your feedback. If you receive a survey regarding your visit,  I greatly appreciate you taking time to fill this out.   Annitta Needs, PhD, ANP-BC [facility]    Gastroesophageal Reflux Disease, Adult Normally, food travels down the esophagus and stays in the stomach to be digested. If a person has gastroesophageal reflux disease (GERD), food and stomach acid move back up into the esophagus. When this happens, the esophagus becomes sore and swollen (inflamed). Over time, GERD can make small holes (ulcers) in the lining of the esophagus. Follow these instructions at home: Diet  Follow a diet as told by your doctor. You may need to avoid foods and drinks such as: ? Coffee and tea (with or without caffeine). ? Drinks that contain alcohol. ? Energy drinks and sports drinks. ? Carbonated drinks or sodas. ? Chocolate and cocoa. ? Peppermint and mint flavorings. ? Garlic and onions. ? Horseradish. ? Spicy and acidic foods, such as peppers, chili powder, curry powder, vinegar, hot sauces, and BBQ sauce. ? Citrus fruit juices and citrus fruits, such as oranges, lemons, and limes. ? Tomato-based foods, such as red sauce, chili, salsa, and pizza with red sauce. ? Fried and fatty foods, such as donuts, french fries, potato chips, and  high-fat dressings. ? High-fat meats, such as hot dogs, rib eye steak, sausage, ham, and bacon. ? High-fat dairy items, such as whole milk, butter, and cream cheese.  Eat small meals often. Avoid eating large meals.  Avoid drinking large amounts of liquid with your meals.  Avoid eating meals during the 2-3 hours before bedtime.  Avoid lying down right after you eat.  Do not exercise right after you eat. General instructions  Pay attention to any changes in your symptoms.  Take over-the-counter and prescription medicines only as told by your doctor. Do not take aspirin, ibuprofen, or other NSAIDs unless your doctor says it is okay.  Do not use any tobacco products, including cigarettes, chewing tobacco, and e-cigarettes. If you need help quitting, ask your doctor.  Wear loose clothes. Do not wear anything tight around your waist.  Raise (elevate) the head of your bed about 6 inches (15 cm).  Try to lower your stress. If you need help doing this, ask your doctor.  If you are overweight, lose an amount of weight that is healthy for you. Ask your doctor about a safe weight loss goal.  Keep all follow-up visits as told by your doctor. This is important. Contact a doctor if:  You have new symptoms.  You lose weight and you do not know why it is happening.  You have trouble swallowing, or it hurts to swallow.  You have wheezing or a cough that keeps happening.  Your symptoms do not get better  with treatment.  You have a hoarse voice. Get help right away if:  You have pain in your arms, neck, jaw, teeth, or back.  You feel sweaty, dizzy, or light-headed.  You have chest pain or shortness of breath.  You throw up (vomit) and your throw up looks like blood or coffee grounds.  You pass out (faint).  Your poop (stool) is bloody or black.  You cannot swallow, drink, or eat. This information is not intended to replace advice given to you by your health care provider. Make  sure you discuss any questions you have with your health care provider. Document Released: 07/17/2007 Document Revised: 07/06/2015 Document Reviewed: 05/25/2014 Elsevier Interactive Patient Education  [employer].
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
## 2017-07-18 NOTE — Assessment & Plan Note (Signed)
42 year old female with chronic, intermittent rectal bleeding in the setting of constipation. Likely benign anorectal source but needs diagnostic colonoscopy. Will continue to address constipation aggressively. May be a candidate for hemorrhoid banding, which I discussed with her today.  Proceed with TCS with Dr. Gala Romney in near future: the risks, benefits, and alternatives have been discussed with the patient in detail. The patient states understanding and desires to proceed. Propofol due to polypharmacy Return in 4 months

## 2017-07-18 NOTE — Assessment & Plan Note (Signed)
Linzess dosing with classic side effect of diarrhea. Trial Amitiza 24 mcg BID. Samples provided.

## 2017-07-20 ENCOUNTER — Encounter: Payer: Self-pay | Admitting: Family Medicine

## 2017-07-21 ENCOUNTER — Telehealth: Payer: Self-pay | Admitting: *Deleted

## 2017-07-21 NOTE — Telephone Encounter (Signed)
Pre-op scheduled for 09/16/17 at 11:00am. Letter mailed. Message sent on [facility]

## 2017-08-05 ENCOUNTER — Other Ambulatory Visit: Payer: Self-pay | Admitting: Family Medicine

## 2017-08-05 MED ORDER — ALPRAZOLAM 0.5 MG PO TABS: ORAL_TABLET | ORAL | 2 refills | Status: DC

## 2017-08-05 NOTE — Telephone Encounter (Signed)
Last seen 07/15/17  Dr Stacks 

## 2017-08-06 DIAGNOSIS — M25532 Pain in left wrist: Secondary | ICD-10-CM | POA: Diagnosis not present

## 2017-08-06 DIAGNOSIS — M7712 Lateral epicondylitis, left elbow: Secondary | ICD-10-CM | POA: Diagnosis not present

## 2017-08-13 ENCOUNTER — Ambulatory Visit (HOSPITAL_COMMUNITY)
Admission: RE | Admit: 2017-08-13 | Discharge: 2017-08-13 | Disposition: A | Discharge status: Home / Self Care | Payer: Medicare Other | Source: Ambulatory Visit | Attending: Unknown Physician Specialty | Admitting: Unknown Physician Specialty

## 2017-08-13 ENCOUNTER — Encounter (HOSPITAL_COMMUNITY): Payer: Self-pay

## 2017-08-13 DIAGNOSIS — Z1231 Encounter for screening mammogram for malignant neoplasm of breast: Secondary | ICD-10-CM | POA: Insufficient documentation

## 2017-08-28 DIAGNOSIS — M5416 Radiculopathy, lumbar region: Secondary | ICD-10-CM | POA: Diagnosis not present

## 2017-08-28 DIAGNOSIS — M961 Postlaminectomy syndrome, not elsewhere classified: Secondary | ICD-10-CM | POA: Diagnosis not present

## 2017-08-29 ENCOUNTER — Ambulatory Visit: Payer: Medicare Other | Admitting: Family Medicine

## 2017-08-29 ENCOUNTER — Ambulatory Visit (INDEPENDENT_AMBULATORY_CARE_PROVIDER_SITE_OTHER): Payer: Medicare Other | Admitting: Family Medicine

## 2017-08-29 ENCOUNTER — Encounter: Payer: Self-pay | Admitting: Family Medicine

## 2017-08-29 VITALS — BP 125/65 | HR 92 | Temp 98.0°F | Ht 68.0 in | Wt 254.6 lb

## 2017-08-29 DIAGNOSIS — R400 Somnolence: Secondary | ICD-10-CM

## 2017-08-29 NOTE — Progress Notes (Signed)
   HPI  Patient presents today here with daytime sleepiness.  Patient states that she has family history of narcolepsy.  Over the last several months, perhaps almost a year, she has had daytime sleepiness, fatigue, decreased energy.  She states that she falls asleep mid sentence at times and multiple times a day.  She also has history of very loud snoring and subjective observed episodes of apnea by her partner.  She is interested in sleep studies  PMH: Smoking status noted ROS: Per HPI  Objective: BP 125/65   Pulse 92   Temp 98 F (36.7 C) (Oral)   Ht 5\' 8"  (1.727 m)   Wt 254 lb 9.6 oz (115.5 kg)   BMI 38.71 kg/m  Gen: NAD, alert, cooperative with exam HEENT: NCAT CV: RRR, good S1/S2, no murmur Resp: CTABL, no wheezes, non-labored Ext: No edema, warm Neuro: Alert and oriented, No gross deficits  Assessment and plan:  #Daytime sleepiness With reported apnea and loud snoring, likely sleep apnea Discussed possibility of narcolepsy also, recommended discussion with sleep specialist for this. Refer to pulmonology for their opinion    Orders Placed This Encounter  Procedures  . Ambulatory referral to Pulmonology    Referral Priority:   Routine    Referral Type:   Consultation    Referral Reason:   Specialty Services Required    Requested Specialty:   Pulmonary Disease    Number of Visits Requested:   [place], MD [facility] 08/29/2017, 5:14 PM

## 2017-08-29 NOTE — Patient Instructions (Signed)
Great to see you!   

## 2017-09-02 ENCOUNTER — Ambulatory Visit (HOSPITAL_COMMUNITY): Payer: Medicare Other | Attending: Orthopedic Surgery | Admitting: Occupational Therapy

## 2017-09-02 ENCOUNTER — Encounter (HOSPITAL_COMMUNITY): Payer: Self-pay | Admitting: Occupational Therapy

## 2017-09-02 ENCOUNTER — Other Ambulatory Visit: Payer: Self-pay

## 2017-09-02 DIAGNOSIS — M25522 Pain in left elbow: Secondary | ICD-10-CM | POA: Diagnosis not present

## 2017-09-02 DIAGNOSIS — M6281 Muscle weakness (generalized): Secondary | ICD-10-CM | POA: Insufficient documentation

## 2017-09-02 DIAGNOSIS — R29898 Other symptoms and signs involving the musculoskeletal system: Secondary | ICD-10-CM | POA: Insufficient documentation
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
## 2017-09-02 NOTE — Patient Instructions (Signed)
Home Exercises Program Theraputty Exercises  Do the following exercises 2 times a day using your affected hand.  1. Roll putty into a ball.  2. Make into a pancake.  3. Roll putty into a roll.  4. Pinch along log with first finger and thumb.   5. Make into a ball.  6. Roll it back into a log.   7. Pinch using thumb and side of first finger.  8. Roll into a ball, then flatten into a pancake.  9. Using your fingers, make putty into a mountain.   

## 2017-09-03 ENCOUNTER — Telehealth: Payer: Self-pay | Admitting: Family Medicine

## 2017-09-03 NOTE — Telephone Encounter (Signed)
What is the name of the medication? Breathing treatment machine to keep at home  Have you contacted your pharmacy to request a refill? no  Which pharmacy would you like this sent to? [facility] in [place]   Patient notified that their request is being sent to the clinical staff for review and that they should receive a call once it is complete. If they do not receive a call within 24 hours they can check with their pharmacy or our office.

## 2017-09-03 NOTE — Telephone Encounter (Signed)
Patient is wondering if you feel that she would benefit from using a nebulizer machine with Albuterol, instead of using the inhaler.  If so, she would like for you to prescribe.  Please advise.

## 2017-09-03 NOTE — Telephone Encounter (Signed)
Patient was informed of your advice.  She is adamant that this be sent in for her.  I offered her an appointment to come in to discuss this with you but she has refused.

## 2017-09-03 NOTE — Telephone Encounter (Signed)
Usually not necessary. With good technique the inhaler works as well and is more convenient

## 2017-09-05 ENCOUNTER — Ambulatory Visit (HOSPITAL_COMMUNITY): Payer: Medicare Other

## 2017-09-05 ENCOUNTER — Encounter (HOSPITAL_COMMUNITY): Payer: Self-pay

## 2017-09-05 ENCOUNTER — Other Ambulatory Visit: Payer: Self-pay

## 2017-09-05 DIAGNOSIS — M25522 Pain in left elbow: Secondary | ICD-10-CM | POA: Diagnosis not present

## 2017-09-05 DIAGNOSIS — R29898 Other symptoms and signs involving the musculoskeletal system: Secondary | ICD-10-CM

## 2017-09-05 DIAGNOSIS — M6281 Muscle weakness (generalized): Secondary | ICD-10-CM | POA: Diagnosis not present
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
## 2017-09-08 ENCOUNTER — Ambulatory Visit (HOSPITAL_COMMUNITY): Payer: Medicare Other | Admitting: Specialist

## 2017-09-08 ENCOUNTER — Encounter (HOSPITAL_COMMUNITY): Payer: Self-pay | Admitting: Specialist

## 2017-09-08 DIAGNOSIS — M25522 Pain in left elbow: Secondary | ICD-10-CM | POA: Diagnosis not present

## 2017-09-08 DIAGNOSIS — M6281 Muscle weakness (generalized): Secondary | ICD-10-CM | POA: Diagnosis not present

## 2017-09-08 DIAGNOSIS — R29898 Other symptoms and signs involving the musculoskeletal system: Secondary | ICD-10-CM
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
## 2017-09-09 ENCOUNTER — Other Ambulatory Visit: Payer: Self-pay | Admitting: Family Medicine

## 2017-09-10 ENCOUNTER — Other Ambulatory Visit: Payer: Self-pay

## 2017-09-10 ENCOUNTER — Ambulatory Visit (HOSPITAL_COMMUNITY): Payer: Medicare Other

## 2017-09-10 ENCOUNTER — Encounter (HOSPITAL_COMMUNITY): Payer: Self-pay

## 2017-09-10 DIAGNOSIS — M25522 Pain in left elbow: Secondary | ICD-10-CM

## 2017-09-10 DIAGNOSIS — M6281 Muscle weakness (generalized): Secondary | ICD-10-CM | POA: Diagnosis not present

## 2017-09-10 DIAGNOSIS — R29898 Other symptoms and signs involving the musculoskeletal system: Secondary | ICD-10-CM | POA: Diagnosis not present
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
## 2017-09-11 NOTE — Patient Instructions (Signed)
MACIEL KEGG  09/11/2017     @PREFPERIOPPHARMACY @   Your procedure is scheduled on  09/22/2017 .  Report to [facility] at  930   A.M.  Call this number if you have problems the morning of surgery:  [phone]   Remember:  Do not eat or drink after midnight.  You may drink clear liquids until (follow the instructions given to you) .  Clear liquids allowed are:                    Water, Juice (non-citric and without pulp), Carbonated beverages, Clear Tea, Black Coffee only, Plain Jell-O only, Gatorade and Plain Popsicles only    Take these medicines the morning of surgery with A SIP OF WATER  Flexaril, hydrocodone. Use your inhaler before you come.    Do not wear jewelry, make-up or nail polish.  Do not wear lotions, powders, or perfumes, or deodorant.  Do not shave 48 hours prior to surgery.  Men may shave face and neck.  Do not bring valuables to the hospital.  [facility] is not responsible for any belongings or valuables.  Contacts, dentures or bridgework may not be worn into surgery.  Leave your suitcase in the car.  After surgery it may be brought to your room.  For patients admitted to the hospital, discharge time will be determined by your treatment team.  Patients discharged the day of surgery will not be allowed to drive home.   Name and phone number of your driver:   family Special instructions:  Follow the diet and prep instructions given to you by Dr Roseanne Kaufman office.  Please read over the following fact sheets that you were given. Anesthesia Post-op Instructions and Care and Recovery After Surgery       Colonoscopy, Adult A colonoscopy is an exam to look at the large intestine. It is done to check for problems, such as:  Lumps (tumors).  Growths (polyps).  Swelling (inflammation).  Bleeding.  What happens before the procedure? Eating and drinking Follow instructions from your doctor about eating and drinking. These instructions may  include:  A few days before the procedure - follow a low-fiber diet. ? Avoid nuts. ? Avoid seeds. ? Avoid dried fruit. ? Avoid raw fruits. ? Avoid vegetables.  1-3 days before the procedure - follow a clear liquid diet. Avoid liquids that have red or purple dye. Drink only clear liquids, such as: ? Clear broth or bouillon. ? Black coffee or tea. ? Clear juice. ? Clear soft drinks or sports drinks. ? Gelatin dessert. ? Popsicles.  On the day of the procedure - do not eat or drink anything during the 2 hours before the procedure.  Bowel prep If you were prescribed an oral bowel prep:  Take it as told by your doctor. Starting the day before your procedure, you will need to drink a lot of liquid. The liquid will cause you to poop (have bowel movements) until your poop is almost clear or light green.  If your skin or butt gets irritated from diarrhea, you may: ? Wipe the area with wipes that have medicine in them, such as adult wet wipes with aloe and vitamin E. ? Put something on your skin that soothes the area, such as petroleum jelly.  If you throw up (vomit) while drinking the bowel prep, take a break for up to 60 minutes. Then begin the bowel prep again.  If you keep throwing up and you cannot take the bowel prep without throwing up, call your doctor.  General instructions  Ask your doctor about changing or stopping your normal medicines. This is important if you take diabetes medicines or blood thinners.  Plan to have someone take you home from the hospital or clinic. What happens during the procedure?  An IV tube may be put into one of your veins.  You will be given medicine to help you relax (sedative).  To reduce your risk of infection: ? Your doctors will wash their hands. ? Your anal area will be washed with soap.  You will be asked to lie on your side with your knees bent.  Your doctor will get a long, thin, flexible tube ready. The tube will have a camera and a  light on the end.  The tube will be put into your anus.  The tube will be gently put into your large intestine.  Air will be delivered into your large intestine to keep it open. You may feel some pressure or cramping.  The camera will be used to take photos.  A small tissue sample may be removed from your body to be looked at under a microscope (biopsy). If any possible problems are found, the tissue will be sent to a lab for testing.  If small growths are found, your doctor may remove them and have them checked for cancer.  The tube that was put into your anus will be slowly removed. The procedure may vary among doctors and hospitals. What happens after the procedure?  Your doctor will check on you often until the medicines you were given have worn off.  Do not drive for 24 hours after the procedure.  You may have a small amount of blood in your poop.  You may pass gas.  You may have mild cramps or bloating in your belly (abdomen).  It is up to you to get the results of your procedure. Ask your doctor, or the department performing the procedure, when your results will be ready. This information is not intended to replace advice given to you by your health care provider. Make sure you discuss any questions you have with your health care provider. Document Released: 03/02/2010 Document Revised: 11/29/2015 Document Reviewed: 04/11/2015 Elsevier Interactive Patient Education  2017 [employer]  Colonoscopy, Adult, Care After This sheet gives you information about how to care for yourself after your procedure. Your health care provider may also give you more specific instructions. If you have problems or questions, contact your health care provider. What can I expect after the procedure? After the procedure, it is common to have:  A small amount of blood in your stool for 24 hours after the procedure.  Some gas.  Mild abdominal cramping or bloating.  Follow these  instructions at home: General instructions   For the first 24 hours after the procedure: ? Do not drive or use machinery. ? Do not sign important documents. ? Do not drink alcohol. ? Do your regular daily activities at a slower pace than normal. ? Eat soft, easy-to-digest foods. ? Rest often.  Take over-the-counter or prescription medicines only as told by your health care provider.  It is up to you to get the results of your procedure. Ask your health care provider, or the department performing the procedure, when your results will be ready. Relieving cramping and bloating  Try walking around when you have cramps or feel bloated.  Apply  heat to your abdomen as told by your health care provider. Use a heat source that your health care provider recommends, such as a moist heat pack or a heating pad. ? Place a towel between your skin and the heat source. ? Leave the heat on for 20-30 minutes. ? Remove the heat if your skin turns bright red. This is especially important if you are unable to feel pain, heat, or cold. You may have a greater risk of getting burned. Eating and drinking  Drink enough fluid to keep your urine clear or pale yellow.  Resume your normal diet as instructed by your health care provider. Avoid heavy or fried foods that are hard to digest.  Avoid drinking alcohol for as long as instructed by your health care provider. Contact a health care provider if:  You have blood in your stool 2-3 days after the procedure. Get help right away if:  You have more than a small spotting of blood in your stool.  You pass large blood clots in your stool.  Your abdomen is swollen.  You have nausea or vomiting.  You have a fever.  You have increasing abdominal pain that is not relieved with medicine. This information is not intended to replace advice given to you by your health care provider. Make sure you discuss any questions you have with your health care  provider. Document Released: 09/12/2003 Document Revised: 10/23/2015 Document Reviewed: 04/11/2015 Elsevier Interactive Patient Education  2018 [place] Anesthesia is a term that refers to techniques, procedures, and medicines that help a person stay safe and comfortable during a medical procedure. Monitored anesthesia care, or sedation, is one type of anesthesia. Your anesthesia specialist may recommend sedation if you will be having a procedure that does not require you to be unconscious, such as:  Cataract surgery.  A dental procedure.  A biopsy.  A colonoscopy.  During the procedure, you may receive a medicine to help you relax (sedative). There are three levels of sedation:  Mild sedation. At this level, you may feel awake and relaxed. You will be able to follow directions.  Moderate sedation. At this level, you will be sleepy. You may not remember the procedure.  Deep sedation. At this level, you will be asleep. You will not remember the procedure.  The more medicine you are given, the deeper your level of sedation will be. Depending on how you respond to the procedure, the anesthesia specialist may change your level of sedation or the type of anesthesia to fit your needs. An anesthesia specialist will monitor you closely during the procedure. Let your health care provider know about:  Any allergies you have.  All medicines you are taking, including vitamins, herbs, eye drops, creams, and over-the-counter medicines.  Any use of steroids (by mouth or as a cream).  Any problems you or family members have had with sedatives and anesthetic medicines.  Any blood disorders you have.  Any surgeries you have had.  Any medical conditions you have, such as sleep apnea.  Whether you are pregnant or may be pregnant.  Any use of cigarettes, alcohol, or street drugs. What are the risks? Generally, this is a safe procedure. However, problems may  occur, including:  Getting too much medicine (oversedation).  Nausea.  Allergic reaction to medicines.  Trouble breathing. If this happens, a breathing tube may be used to help with breathing. It will be removed when you are awake and breathing on your own.  Heart trouble.  Lung trouble.  Before the procedure Staying hydrated Follow instructions from your health care provider about hydration, which may include:  Up to 2 hours before the procedure - you may continue to drink clear liquids, such as water, clear fruit juice, black coffee, and plain tea.  Eating and drinking restrictions Follow instructions from your health care provider about eating and drinking, which may include:  8 hours before the procedure - stop eating heavy meals or foods such as meat, fried foods, or fatty foods.  6 hours before the procedure - stop eating light meals or foods, such as toast or cereal.  6 hours before the procedure - stop drinking milk or drinks that contain milk.  2 hours before the procedure - stop drinking clear liquids.  Medicines Ask your health care provider about:  Changing or stopping your regular medicines. This is especially important if you are taking diabetes medicines or blood thinners.  Taking medicines such as aspirin and ibuprofen. These medicines can thin your blood. Do not take these medicines before your procedure if your health care provider instructs you not to.  Tests and exams  You will have a physical exam.  You may have blood tests done to show: ? How well your kidneys and liver are working. ? How well your blood can clot.  General instructions  Plan to have someone take you home from the hospital or clinic.  If you will be going home right after the procedure, plan to have someone with you for 24 hours.  What happens during the procedure?  Your blood pressure, heart rate, breathing, level of pain and overall condition will be monitored.  An IV  tube will be inserted into one of your veins.  Your anesthesia specialist will give you medicines as needed to keep you comfortable during the procedure. This may mean changing the level of sedation.  The procedure will be performed. After the procedure  Your blood pressure, heart rate, breathing rate, and blood oxygen level will be monitored until the medicines you were given have worn off.  Do not drive for 24 hours if you received a sedative.  You may: ? Feel sleepy, clumsy, or nauseous. ? Feel forgetful about what happened after the procedure. ? Have a sore throat if you had a breathing tube during the procedure. ? Vomit. This information is not intended to replace advice given to you by your health care provider. Make sure you discuss any questions you have with your health care provider. Document Released: 10/24/2004 Document Revised: 07/07/2015 Document Reviewed: 05/21/2015 Elsevier Interactive Patient Education  2018 [place], Care After These instructions provide you with information about caring for yourself after your procedure. Your health care provider may also give you more specific instructions. Your treatment has been planned according to current medical practices, but problems sometimes occur. Call your health care provider if you have any problems or questions after your procedure. What can I expect after the procedure? After your procedure, it is common to:  Feel sleepy for several hours.  Feel clumsy and have poor balance for several hours.  Feel forgetful about what happened after the procedure.  Have poor judgment for several hours.  Feel nauseous or vomit.  Have a sore throat if you had a breathing tube during the procedure.  Follow these instructions at home: For at least 24 hours after the procedure:   Do not: ? Participate in activities in which you could fall  or become injured. ? Drive. ? Use heavy  machinery. ? Drink alcohol. ? Take sleeping pills or medicines that cause drowsiness. ? Make important decisions or sign legal documents. ? Take care of children on your own.  Rest. Eating and drinking  Follow the diet that is recommended by your health care provider.  If you vomit, drink water, juice, or soup when you can drink without vomiting.  Make sure you have little or no nausea before eating solid foods. General instructions  Have a responsible adult stay with you until you are awake and alert.  Take over-the-counter and prescription medicines only as told by your health care provider.  If you smoke, do not smoke without supervision.  Keep all follow-up visits as told by your health care provider. This is important. Contact a health care provider if:  You keep feeling nauseous or you keep vomiting.  You feel light-headed.  You develop a rash.  You have a fever. Get help right away if:  You have trouble breathing. This information is not intended to replace advice given to you by your health care provider. Make sure you discuss any questions you have with your health care provider. Document Released: 05/21/2015 Document Revised: 09/20/2015 Document Reviewed: 05/21/2015 Elsevier Interactive Patient Education  [employer].

## 2017-09-15 ENCOUNTER — Ambulatory Visit (HOSPITAL_COMMUNITY): Payer: Medicare Other

## 2017-09-15 DIAGNOSIS — M62830 Muscle spasm of back: Secondary | ICD-10-CM | POA: Diagnosis not present

## 2017-09-15 DIAGNOSIS — M961 Postlaminectomy syndrome, not elsewhere classified: Secondary | ICD-10-CM | POA: Diagnosis not present

## 2017-09-16 ENCOUNTER — Encounter (HOSPITAL_COMMUNITY)
Admission: RE | Admit: 2017-09-16 | Discharge: 2017-09-16 | Disposition: A | Discharge status: Home / Self Care | Payer: Medicare Other | Source: Ambulatory Visit | Attending: Internal Medicine | Admitting: Internal Medicine

## 2017-09-16 ENCOUNTER — Encounter (HOSPITAL_COMMUNITY): Payer: Self-pay | Admitting: *Deleted

## 2017-09-16 DIAGNOSIS — Z01812 Encounter for preprocedural laboratory examination: Secondary | ICD-10-CM | POA: Insufficient documentation

## 2017-09-16 LAB — CBC
HCT: 44.9 % (ref 36.0–46.0)
HEMOGLOBIN: 14.9 g/dL (ref 12.0–15.0)
MCH: 28.4 pg (ref 26.0–34.0)
MCHC: 33.2 g/dL (ref 30.0–36.0)
MCV: 85.5 fL (ref 78.0–100.0)
Platelets: 293 10*3/uL (ref 150–400)
RBC: 5.25 MIL/uL — ABNORMAL HIGH (ref 3.87–5.11)
RDW: 13.9 % (ref 11.5–15.5)
WBC: 10 10*3/uL (ref 4.0–10.5)

## 2017-09-16 LAB — BASIC METABOLIC PANEL
Anion gap: 8 (ref 5–15)
BUN: 7 mg/dL (ref 6–20)
CHLORIDE: 106 mmol/L (ref 98–111)
CO2: 23 mmol/L (ref 22–32)
CREATININE: 0.78 mg/dL (ref 0.44–1.00)
Calcium: 8.9 mg/dL (ref 8.9–10.3)
GFR calc non Af Amer: >60 mL/min (ref >60)
Glucose, Bld: 96 mg/dL (ref 70–99)
Potassium: 3.5 mmol/L (ref 3.5–5.1)
SODIUM: 137 mmol/L (ref 135–145)

## 2017-09-16 NOTE — Progress Notes (Signed)
CBC ok

## 2017-09-17 ENCOUNTER — Encounter (HOSPITAL_COMMUNITY): Payer: Self-pay | Admitting: Occupational Therapy

## 2017-09-17 ENCOUNTER — Ambulatory Visit (HOSPITAL_COMMUNITY): Payer: Medicare Other | Attending: Orthopedic Surgery | Admitting: Occupational Therapy

## 2017-09-17 DIAGNOSIS — M6281 Muscle weakness (generalized): Secondary | ICD-10-CM | POA: Diagnosis not present

## 2017-09-17 DIAGNOSIS — R29898 Other symptoms and signs involving the musculoskeletal system: Secondary | ICD-10-CM | POA: Insufficient documentation

## 2017-09-17 DIAGNOSIS — M25522 Pain in left elbow: Secondary | ICD-10-CM | POA: Diagnosis not present

## 2017-09-17 DIAGNOSIS — M5416 Radiculopathy, lumbar region: Secondary | ICD-10-CM | POA: Diagnosis not present

## 2017-09-17 DIAGNOSIS — R262 Difficulty in walking, not elsewhere classified: Secondary | ICD-10-CM | POA: Diagnosis not present
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
## 2017-09-19 ENCOUNTER — Encounter (HOSPITAL_COMMUNITY): Payer: Self-pay | Admitting: Occupational Therapy

## 2017-09-19 ENCOUNTER — Ambulatory Visit (HOSPITAL_COMMUNITY): Payer: Medicare Other | Admitting: Occupational Therapy

## 2017-09-19 DIAGNOSIS — R262 Difficulty in walking, not elsewhere classified: Secondary | ICD-10-CM | POA: Diagnosis not present

## 2017-09-19 DIAGNOSIS — M6281 Muscle weakness (generalized): Secondary | ICD-10-CM | POA: Diagnosis not present

## 2017-09-19 DIAGNOSIS — M25522 Pain in left elbow: Secondary | ICD-10-CM | POA: Diagnosis not present

## 2017-09-19 DIAGNOSIS — R29898 Other symptoms and signs involving the musculoskeletal system: Secondary | ICD-10-CM | POA: Diagnosis not present

## 2017-09-19 DIAGNOSIS — M5416 Radiculopathy, lumbar region: Secondary | ICD-10-CM | POA: Diagnosis not present
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
## 2017-09-22 ENCOUNTER — Encounter (HOSPITAL_COMMUNITY): Payer: Self-pay | Admitting: *Deleted

## 2017-09-22 ENCOUNTER — Other Ambulatory Visit: Payer: Self-pay

## 2017-09-22 ENCOUNTER — Telehealth: Payer: Self-pay

## 2017-09-22 ENCOUNTER — Encounter (HOSPITAL_COMMUNITY): Payer: Self-pay | Admitting: Occupational Therapy

## 2017-09-22 ENCOUNTER — Ambulatory Visit (HOSPITAL_COMMUNITY)
Admission: RE | Admit: 2017-09-22 | Discharge: 2017-09-22 | Disposition: A | Discharge status: Home / Self Care | Payer: Medicare Other | Source: Ambulatory Visit | Attending: Internal Medicine | Admitting: Internal Medicine

## 2017-09-22 ENCOUNTER — Encounter (HOSPITAL_COMMUNITY)
Admission: RE | Disposition: A | Discharge status: Home / Self Care | Payer: Self-pay | Source: Ambulatory Visit | Attending: Internal Medicine

## 2017-09-22 ENCOUNTER — Ambulatory Visit (HOSPITAL_COMMUNITY): Payer: Medicare Other | Admitting: Anesthesiology

## 2017-09-22 DIAGNOSIS — K640 First degree hemorrhoids: Secondary | ICD-10-CM

## 2017-09-22 DIAGNOSIS — F319 Bipolar disorder, unspecified: Secondary | ICD-10-CM | POA: Insufficient documentation

## 2017-09-22 DIAGNOSIS — N189 Chronic kidney disease, unspecified: Secondary | ICD-10-CM | POA: Diagnosis not present

## 2017-09-22 DIAGNOSIS — F419 Anxiety disorder, unspecified: Secondary | ICD-10-CM | POA: Insufficient documentation

## 2017-09-22 DIAGNOSIS — F17210 Nicotine dependence, cigarettes, uncomplicated: Secondary | ICD-10-CM | POA: Insufficient documentation

## 2017-09-22 DIAGNOSIS — J45909 Unspecified asthma, uncomplicated: Secondary | ICD-10-CM | POA: Diagnosis not present

## 2017-09-22 DIAGNOSIS — Z800 Family history of malignant neoplasm of digestive organs: Secondary | ICD-10-CM | POA: Diagnosis not present

## 2017-09-22 DIAGNOSIS — K921 Melena: Secondary | ICD-10-CM | POA: Insufficient documentation

## 2017-09-22 DIAGNOSIS — Z79899 Other long term (current) drug therapy: Secondary | ICD-10-CM | POA: Diagnosis not present

## 2017-09-22 DIAGNOSIS — K625 Hemorrhage of anus and rectum: Secondary | ICD-10-CM

## 2017-09-22 HISTORY — PX: COLONOSCOPY WITH PROPOFOL: SHX5780

## 2017-09-22 SURGERY — COLONOSCOPY WITH PROPOFOL
Anesthesia: Monitor Anesthesia Care

## 2017-09-22 MED ORDER — LIDOCAINE HCL (PF) 1 % IJ SOLN
INTRAMUSCULAR | Status: DC | PRN
  Administered 2017-09-22: 20 mg

## 2017-09-22 MED ORDER — CHLORHEXIDINE GLUCONATE CLOTH 2 % EX PADS: 6.0000 | MEDICATED_PAD | Freq: Once | CUTANEOUS | Status: DC

## 2017-09-22 MED ORDER — LIDOCAINE HCL (PF) 1 % IJ SOLN
INTRAMUSCULAR | Status: AC
  Filled 2017-09-22: qty 5

## 2017-09-22 MED ORDER — LACTATED RINGERS IV SOLN
INTRAVENOUS | Status: DC
  Administered 2017-09-22: 10:00:00 via INTRAVENOUS

## 2017-09-22 MED ORDER — PROPOFOL 10 MG/ML IV BOLUS
INTRAVENOUS | Status: DC | PRN
  Administered 2017-09-22 (×2): 100 mg via INTRAVENOUS

## 2017-09-22 MED ORDER — PROPOFOL 10 MG/ML IV BOLUS
INTRAVENOUS | Status: AC
  Filled 2017-09-22: qty 20

## 2017-09-22 NOTE — Discharge Instructions (Signed)
Colonoscopy Discharge Instructions  Read the instructions outlined below and refer to this sheet in the next few weeks. These discharge instructions provide you with general information on caring for yourself after you leave the hospital. Your doctor may also give you specific instructions. While your treatment has been planned according to the most current medical practices available, unavoidable complications occasionally occur. If you have any problems or questions after discharge, call Dr. Gala Romney at [phone]. ACTIVITY  You may resume your regular activity, but move at a slower pace for the next 24 hours.   Take frequent rest periods for the next 24 hours.   Walking will help get rid of the air and reduce the bloated feeling in your belly (abdomen).   No driving for 24 hours (because of the medicine (anesthesia) used during the test).    Do not sign any important legal documents or operate any machinery for 24 hours (because of the anesthesia used during the test).  NUTRITION  Drink plenty of fluids.   You may resume your normal diet as instructed by your doctor.   Begin with a light meal and progress to your normal diet. Heavy or fried foods are harder to digest and may make you feel sick to your stomach (nauseated).   Avoid alcoholic beverages for 24 hours or as instructed.  MEDICATIONS  You may resume your normal medications unless your doctor tells you otherwise.  WHAT YOU CAN EXPECT TODAY  Some feelings of bloating in the abdomen.   Passage of more gas than usual.   Spotting of blood in your stool or on the toilet paper.  IF YOU HAD POLYPS REMOVED DURING THE COLONOSCOPY:  No aspirin products for 7 days or as instructed.   No alcohol for 7 days or as instructed.   Eat a soft diet for the next 24 hours.  FINDING OUT THE RESULTS OF YOUR TEST Not all test results are available during your visit. If your test results are not back during the visit, make an appointment  with your caregiver to find out the results. Do not assume everything is normal if you have not heard from your caregiver or the medical facility. It is important for you to follow up on all of your test results.  SEEK IMMEDIATE MEDICAL ATTENTION IF:  You have more than a spotting of blood in your stool.   Your belly is swollen (abdominal distention).   You are nauseated or vomiting.   You have a temperature over 101.   You have abdominal pain or discomfort that is severe or gets worse throughout the day.   Constipation and hemorrhoid information provided  Stop Linzess  Try MiraLAX 17 g orally daily to twice daily for constipation  Repeat colonoscopy in 10 years for screening  Pamphlet on hemorrhoid banding provided  Office visit with [place] in 6-8 weeks  Hemorrhoids Hemorrhoids are swollen veins in and around the rectum or anus. There are two types of hemorrhoids:  Internal hemorrhoids. These occur in the veins that are just inside the rectum. They may poke through to the outside and become irritated and painful.  External hemorrhoids. These occur in the veins that are outside of the anus and can be felt as a painful swelling or hard lump near the anus.  Most hemorrhoids do not cause serious problems, and they can be managed with home treatments such as diet and lifestyle changes. If home treatments do not help your symptoms, procedures can be done to shrink  or remove the hemorrhoids. What are the causes? This condition is caused by increased pressure in the anal area. This pressure may result from various things, including:  Constipation.  Straining to have a bowel movement.  Diarrhea.  Pregnancy.  Obesity.  Sitting for long periods of time.  Heavy lifting or other activity that causes you to strain.  Anal sex.  What are the signs or symptoms? Symptoms of this condition include:  Pain.  Anal itching or irritation.  Rectal bleeding.  Leakage of stool  (feces).  Anal swelling.  One or more lumps around the anus.  How is this diagnosed? This condition can often be diagnosed through a visual exam. Other exams or tests may also be done, such as:  Examination of the rectal area with a gloved hand (digital rectal exam).  Examination of the anal canal using a small tube (anoscope).  A blood test, if you have lost a significant amount of blood.  A test to look inside the colon (sigmoidoscopy or colonoscopy).  How is this treated? This condition can usually be treated at home. However, various procedures may be done if dietary changes, lifestyle changes, and other home treatments do not help your symptoms. These procedures can help make the hemorrhoids smaller or remove them completely. Some of these procedures involve surgery, and others do not. Common procedures include:  Rubber band ligation. Rubber bands are placed at the base of the hemorrhoids to cut off the blood supply to them.  Sclerotherapy. Medicine is injected into the hemorrhoids to shrink them.  Infrared coagulation. A type of light energy is used to get rid of the hemorrhoids.  Hemorrhoidectomy surgery. The hemorrhoids are surgically removed, and the veins that supply them are tied off.  Stapled hemorrhoidopexy surgery. A circular stapling device is used to remove the hemorrhoids and use staples to cut off the blood supply to them.  Follow these instructions at home: Eating and drinking  Eat foods that have a lot of fiber in them, such as whole grains, beans, nuts, fruits, and vegetables. Ask your health care provider about taking products that have added fiber (fiber supplements).  Drink enough fluid to keep your urine clear or pale yellow. Managing pain and swelling  Take warm sitz baths for 20 minutes, 3-4 times a day to ease pain and discomfort.  If directed, apply ice to the affected area. Using ice packs between sitz baths may be helpful. ? Put ice in a plastic  bag. ? Place a towel between your skin and the bag. ? Leave the ice on for 20 minutes, 2-3 times a day. General instructions  Take over-the-counter and prescription medicines only as told by your health care provider.  Use medicated creams or suppositories as told.  Exercise regularly.  Go to the bathroom when you have the urge to have a bowel movement. Do not wait.  Avoid straining to have bowel movements.  Keep the anal area dry and clean. Use wet toilet paper or moist towelettes after a bowel movement.  Do not sit on the toilet for long periods of time. This increases blood pooling and pain. Contact a health care provider if:  You have increasing pain and swelling that are not controlled by treatment or medicine.  You have uncontrolled bleeding.  You have difficulty having a bowel movement, or you are unable to have a bowel movement.  You have pain or inflammation outside the area of the hemorrhoids. This information is not intended to replace advice  given to you by your health care provider. Make sure you discuss any questions you have with your health care provider. Document Released: 01/26/2000 Document Revised: 06/28/2015 Document Reviewed: 10/12/2014 Elsevier Interactive Patient Education  2018 [employer].  Constipation, Adult Constipation is when a person:  Poops (has a bowel movement) fewer times in a week than normal.  Has a hard time pooping.  Has poop that is dry, hard, or bigger than normal.  Follow these instructions at home: Eating and drinking   Eat foods that have a lot of fiber, such as: ? Fresh fruits and vegetables. ? Whole grains. ? Beans.  Eat less of foods that are high in fat, low in fiber, or overly processed, such as: ? [place] fries. ? Hamburgers. ? Cookies. ? Candy. ? Soda.  Drink enough fluid to keep your pee (urine) clear or pale yellow. General instructions  Exercise regularly or as told by your doctor.  Go to the restroom  when you feel like you need to poop. Do not hold it in.  Take over-the-counter and prescription medicines only as told by your doctor. These include any fiber supplements.  Do pelvic floor retraining exercises, such as: ? Doing deep breathing while relaxing your lower belly (abdomen). ? Relaxing your pelvic floor while pooping.  Watch your condition for any changes.  Keep all follow-up visits as told by your doctor. This is important. Contact a doctor if:  You have pain that gets worse.  You have a fever.  You have not pooped for 4 days.  You throw up (vomit).  You are not hungry.  You lose weight.  You are bleeding from the anus.  You have thin, pencil-like poop (stool). Get help right away if:  You have a fever, and your symptoms suddenly get worse.  You leak poop or have blood in your poop.  Your belly feels hard or bigger than normal (is bloated).  You have very bad belly pain.  You feel dizzy or you faint. This information is not intended to replace advice given to you by your health care provider. Make sure you discuss any questions you have with your health care provider. Document Released: 07/17/2007 Document Revised: 08/18/2015 Document Reviewed: 07/19/2015 Elsevier Interactive Patient Education  2018 [employer].

## 2017-09-22 NOTE — Telephone Encounter (Signed)
VM was received this morning, pt left message 08/19/17. Tried calling pt 09/22/17 due to not being in the office on 09/19/17. Pt is scheduled for procedure this morning 09/22/17. Pt can call back if needed.

## 2017-09-22 NOTE — Transfer of Care (Signed)
Immediate Anesthesia Transfer of Care Note  Patient: Shelby [name]  Procedure(s) Performed: COLONOSCOPY WITH PROPOFOL (N/A )  Patient Location: PACU  Anesthesia Type:MAC  Level of Consciousness: awake, alert , oriented and patient cooperative  Airway & Oxygen Therapy: Patient Spontanous Breathing  Post-op Assessment: Report given to RN and Post -op Vital signs reviewed and stable  Post vital signs: Reviewed and stable  Last Vitals:  Vitals Value Taken Time  BP 103/67   Temp 97.9   Pulse 78   Resp 20   SpO2 96%     Last Pain:  Vitals:   09/22/17 1048  TempSrc:   PainSc: 0-No pain         Complications: No apparent anesthesia complications

## 2017-09-22 NOTE — Op Note (Signed)
[facility] Patient Name: Shelby [name] Procedure Date: 09/22/2017 10:05 AM MRN: [id number] Date of Birth: [birth year] Attending MD: Norvel Richards , MD CSN: [id number] Age: 42 Admit Type: Outpatient Procedure:                Colonoscopy Indications:              Hematochezia Providers:                Norvel Richards, MD, Janeece Riggers, RN, Aram Candela Referring MD:              Medicines:                Propofol per Anesthesia Complications:            No immediate complications. Estimated Blood Loss:     Estimated blood loss: none. Procedure:                Pre-Anesthesia Assessment:                           - Prior to the procedure, a History and Physical                            was performed, and patient medications and                            allergies were reviewed. The patient's tolerance of                            previous anesthesia was also reviewed. The risks                            and benefits of the procedure and the sedation                            options and risks were discussed with the patient.                            All questions were answered, and informed consent                            was obtained. Prior Anticoagulants: The patient has                            taken no previous anticoagulant or antiplatelet                            agents. ASA Grade Assessment: II - A patient with                            mild systemic disease. After reviewing the risks                            and benefits,  the patient was deemed in                            satisfactory condition to undergo the procedure.                           After obtaining informed consent, the colonoscope                            was passed under direct vision. Throughout the                            procedure, the patient's blood pressure, pulse, and                            oxygen saturations were monitored  continuously. The                            [id number]-[id number] ([id number]) scope was introduced through                            the and advanced to the the cecum, identified by                            appendiceal orifice and ileocecal valve. The                            colonoscopy was performed without difficulty. The                            ileocecal valve, appendiceal orifice, and rectum                            were photographed. The entire colon was well                            visualized. Scope In: 10:54:54 AM Scope Out: 11:07:14 AM Scope Withdrawal Time: 0 hours 10 minutes 0 seconds  Total Procedure Duration: 0 hours 12 minutes 20 seconds  Findings:      The perianal and digital rectal examinations were normal.      Non-bleeding internal hemorrhoids were found during retroflexion. The       hemorrhoids were moderate, medium-sized and Grade I (internal       hemorrhoids that do not prolapse).      The exam was otherwise without abnormality on direct and retroflexion       views. Impression:               - Non-bleeding internal hemorrhoids.                           - The examination was otherwise normal on direct                            and retroflexion views.                           -  No specimens collected. Moderate Sedation:      Moderate (conscious) sedation was personally administered by an       anesthesia professional. The following parameters were monitored: oxygen       saturation, heart rate, blood pressure, respiratory rate, EKG, adequacy       of pulmonary ventilation, and response to care. Total physician       intraservice time was 17 minutes. Recommendation:           - Patient has a contact number available for                            emergencies. The signs and symptoms of potential                            delayed complications were discussed with the                            patient. Return to normal activities tomorrow.                             Written discharge instructions were provided to the                            patient.                           - Resume previous diet.                           - Continue present medications.                           - Repeat colonoscopy in 10 years for screening                            purposes.                           - Return to GI office in 6 weeks. Stop Linzess;                            begin MiraLAX 17 g orally daily to twice daily. Procedure Code(s):        --- Professional ---                           [id number] 2015, Colonoscopy, flexible; diagnostic, including                            collection of specimen(s) by brushing or washing,                            when performed (separate procedure) Diagnosis Code(s):        --- Professional ---                           K64.0, First degree hemorrhoids  K92.1, Melena (includes Hematochezia) CPT copyright 2017 American Medical Association. All rights reserved. The codes documented in this report are preliminary and upon coder review may  be revised to meet current compliance requirements. [name]. Dannon Nguyenthi, MD Norvel Richards, MD 09/22/2017 11:14:45 AM This report has been signed electronically. Number of Addenda: 0

## 2017-09-22 NOTE — Anesthesia Postprocedure Evaluation (Signed)
Anesthesia Post Note  Patient: Shelby [name]  Procedure(s) Performed: COLONOSCOPY WITH PROPOFOL (N/A )  Patient location during evaluation: PACU Anesthesia Type: MAC Level of consciousness: awake, awake and alert, oriented and patient cooperative Pain management: pain level controlled Vital Signs Assessment: post-procedure vital signs reviewed and stable Respiratory status: spontaneous breathing, nonlabored ventilation and respiratory function stable Cardiovascular status: blood pressure returned to baseline and stable Postop Assessment: no headache, no backache and no apparent nausea or vomiting Anesthetic complications: no     Last Vitals:  Vitals:   09/22/17 1112 09/22/17 1115  BP: (P) 103/67 (P) 109/63  Pulse: (P) 77 (P) 78  Resp: (P) 20 (P) 18  Temp: (P) 36.6 C   SpO2: (P) 98%     Last Pain:  Vitals:   09/22/17 1048  TempSrc:   PainSc: 0-No pain                 Neilson Oehlert Terri Piedra

## 2017-09-22 NOTE — H&P (Signed)
@LOGO @   Primary Care Physician:  Claretta Fraise, MD Primary Gastroenterologist:  Dr. Gala Romney   Pre-Procedure History & Physical: HPI:  Shelby [name] is a 42 y.o. female here for rectal bleeding. Here for colonoscopy.  Past Medical History:  Diagnosis Date  . Anxiety   . Arthritis   . Asthma   . Bad memory    from [place] brain injury 2001  . Bipolar disorder ([place])   . Chronic kidney disease   . Depression    Patient does not have a problem with depression.  . Endometriosis   . Migraines   . Traumatic brain injury [facility]) 2001   [facility]    Past Surgical History:  Procedure Laterality Date  . ABDOMINAL HYSTERECTOMY  06/2017   endometriosis  . CESAREAN SECTION     x2  . cyst remove     x3 from wrist-ganglion  . HAND SURGERY    . INCISIONAL HERNIA REPAIR  12/27/2011   Procedure: HERNIA REPAIR INCISIONAL;  Surgeon: Jamesetta So, MD;  Location: [facility];  Service: General;  Laterality: N/A;  . INSERTION OF MESH  12/27/2011   Procedure: INSERTION OF MESH;  Surgeon: Jamesetta So, MD;  Location: [facility];  Service: General;  Laterality: N/A;  . TUBAL LIGATION    . UMBILICAL HERNIA REPAIR  09/20/2011   Procedure: HERNIA REPAIR UMBILICAL ADULT;  Surgeon: Jamesetta So, MD;  Location: [facility];  Service: General;  Laterality: N/A;  . uterine ablation    . WOUND DEBRIDEMENT  12/27/2011   Procedure: DEBRIDEMENT ABDOMINAL WOUND;  Surgeon: Jamesetta So, MD;  Location: [facility];  Service: General;  Laterality: N/A;    Prior to Admission medications   Medication Sig Start Date End Date Taking? Authorizing Provider  acetaminophen (TYLENOL) 650 MG CR tablet Take 1,300 mg by mouth 2 (two) times daily as needed for pain.   Yes [provider]  albuterol (VENTOLIN HFA) 108 (90 Base) MCG/ACT inhaler INHALE 2 PUFFS BY MOUTH EVERY 6 HOURS AS NEEDED FOR WHEEZING OR SHORTNESS OF BREATH 09/10/17  Yes Stacks, Cletus Gash, MD  ALPRAZolam Duanne Moron) 0.5 MG tablet  Take 1 and 1/2 tablets QHS Patient taking differently: Take 0.75 mg by mouth at bedtime.  08/05/17  Yes Stacks, Cletus Gash, MD  ARIPiprazole (ABILIFY) 10 MG tablet Take 10 mg by mouth at bedtime.   Yes [provider]  cyclobenzaprine (FLEXERIL) 10 MG tablet Take 1 tablet (10 mg total) by mouth 3 (three) times daily as needed for muscle spasms. 01/23/17  Yes Eustace Moore, MD  Fluoxetine HCl, PMDD, 20 MG CAPS TAKE 3 CAPSULES BY MOUTH EVERY DAY Patient taking differently: TAKE 3 CAPSULES (60 MG) BY MOUTH EVERY DAY AT NIGHT. 07/04/17  Yes Stacks, Cletus Gash, MD  fluticasone furoate-vilanterol (BREO ELLIPTA) 100-25 MCG/INH AEPB Inhale 1 puff into the lungs daily. 04/14/17  Yes Claretta Fraise, MD  hydrOXYzine (ATARAX/VISTARIL) 25 MG tablet TAKE 1 TABLET(25 MG) BY MOUTH TID DAILY AS NEEDED FOR ANXIETY Patient taking differently: Take 25 mg by mouth 3 (three) times daily as needed for anxiety.  07/15/17  Yes Stacks, Cletus Gash, MD  ibuprofen (ADVIL,MOTRIN) 200 MG tablet Take 400 mg by mouth every 8 (eight) hours as needed (for inflammation.).   Yes [provider]  LINZESS 145 MCG CAPS capsule TAKE 1 CAPSULE(145 MCG) BY MOUTH DAILY BEFORE BREAKFAST Patient taking differently: TAKE 1 CAPSULE(145 MCG) BY MOUTH BEFORE BREAKFAST AS NEEDED FOR CONSTIPATION. 04/22/17  Yes Mahala Menghini, PA-C  Na Sulfate-K Sulfate-Mg Sulf 17.5-3.13-1.6 GM/177ML SOLN Take 1 kit by mouth as directed. 07/18/17  Yes Yobany Vroom, [name], MD  ARIPiprazole (ABILIFY) 5 MG tablet Take 3 tablets (15 mg total) by mouth at bedtime. Patient not taking: Reported on 09/09/2017 07/15/17   Claretta Fraise, MD  pantoprazole (PROTONIX) 40 MG tablet Take 1 tablet (40 mg total) by mouth daily. Take 30 minutes before breakfast Patient not taking: Reported on 09/09/2017 07/18/17   Annitta Needs, NP    Allergies as of 07/18/2017 - Review Complete 07/18/2017  Allergen Reaction Noted  . Chantix [varenicline] Other (See Comments) 07/25/2015  . Penicillins Other  (See Comments) 07/26/2011  . Latex Rash 09/18/2011    Family History  Problem Relation Age of Onset  . Diabetes Father   . Colon cancer Paternal Grandfather   . Alzheimer's disease Paternal Grandmother   . Alzheimer's disease Maternal Grandmother   . Healthy Daughter   . Healthy Daughter     Social History   Socioeconomic History  . Marital status: Married    Spouse name: Not on file  . Number of children: 2  . Years of education: some college  . Highest education level: Some college, no degree  Occupational History  . Occupation: disabled  Social Needs  . Financial resource strain: Somewhat hard  . Food insecurity:    Worry: Never true    Inability: Never true  . Transportation needs:    Medical: No    Non-medical: No  Tobacco Use  . Smoking status: Current Every Day Smoker    Packs/day: 1.00    Years: 25.00    Pack years: 25.00    Types: Cigarettes  . Smokeless tobacco: Never Used  Substance and Sexual Activity  . Alcohol use: No    Alcohol/week: 0.0 standard drinks  . Drug use: No  . Sexual activity: Yes  Lifestyle  . Physical activity:    Days per week: 7 days    Minutes per session: 10 min  . Stress: To some extent  Relationships  . Social connections:    Talks on phone: More than three times a week    Gets together: More than three times a week    Attends religious service: Never    Active member of club or organization: No    Attends meetings of clubs or organizations: Never    Relationship status: Married  . Intimate partner violence:    Fear of current or ex partner: Not on file    Emotionally abused: Not on file    Physically abused: Not on file    Forced sexual activity: Not on file  Other Topics Concern  . Not on file  Social History Narrative   Lives w/ parents & 2 children part-time (13/16)    Review of Systems: See HPI, otherwise negative ROS  Physical Exam: BP 116/77   Pulse 73   Temp 98.3 F (36.8 C) (Oral)   Resp 14   Ht  5' 7"  (1.702 m)   Wt 117.5 kg   LMP 05/12/2016   SpO2 94%   BMI 40.57 kg/m  General:   Alert,  Well-developed, well-nourished, pleasant and cooperative in NAD Neck:  Supple; no masses or thyromegaly. No significant cervical adenopathy. Lungs:  Clear throughout to auscultation.   No wheezes, crackles, or rhonchi. No acute distress. Heart:  Regular rate and rhythm; no murmurs, clicks, rubs,  or gallops. Abdomen: Non-distended, normal bowel sounds.  Soft and nontender without appreciable mass or  hepatosplenomegaly.  Pulses:  Normal pulses noted. Extremities:  Without clubbing or edema.  Impression/Plan:  42 year old lady here for colonoscopy to evaluate rectal bleeding further.  The risks, benefits, limitations, alternatives and imponderables have been reviewed with the patient. Questions have been answered. All parties are agreeable.      Notice: This dictation was prepared with Dragon dictation along with smaller phrase technology. Any transcriptional errors that result from this process are unintentional and may not be corrected upon review.

## 2017-09-22 NOTE — Addendum Note (Signed)
Addendum  created 09/22/17 1452 by Mickel Baas, CRNA   Sign clinical note, SmartForm saved

## 2017-09-22 NOTE — Anesthesia Preprocedure Evaluation (Addendum)
Anesthesia Evaluation  Patient identified by MRN, date of birth, ID band Patient awake    Reviewed: Allergy & Precautions, H&P , NPO status , Patient's Chart, lab work & pertinent test results, reviewed documented beta blocker date and time   Airway Mallampati: II  TM Distance: >3 FB Neck ROM: full    Dental no notable dental hx. (+) Edentulous Upper, Edentulous Lower   Pulmonary neg pulmonary ROS, asthma , Current Smoker,    Pulmonary exam normal breath sounds clear to auscultation       Cardiovascular Exercise Tolerance: Good negative cardio ROS   Rhythm:regular Rate:Normal     Neuro/Psych  Headaches, PSYCHIATRIC DISORDERS Anxiety Depression Bipolar Disorder  Neuromuscular disease negative neurological ROS  negative psych ROS   GI/Hepatic negative GI ROS, Neg liver ROS, GERD  ,  Endo/Other  negative endocrine ROSMorbid obesity  Renal/GU Renal diseasenegative Renal ROS  negative genitourinary   Musculoskeletal   Abdominal   Peds  Hematology negative hematology ROS (+)   Anesthesia Other Findings   Reproductive/Obstetrics negative OB ROS                             Anesthesia Physical Anesthesia Plan  ASA: III  Anesthesia Plan: MAC   Post-op Pain Management:    Induction:   PONV Risk Score and Plan:   Airway Management Planned:   Additional Equipment:   Intra-op Plan:   Post-operative Plan:   Informed Consent: I have reviewed the patients History and Physical, chart, labs and discussed the procedure including the risks, benefits and alternatives for the proposed anesthesia with the patient or authorized representative who has indicated his/her understanding and acceptance.     Plan Discussed with: CRNA  Anesthesia Plan Comments:        Anesthesia Quick Evaluation

## 2017-09-22 NOTE — Telephone Encounter (Signed)
Correction, VM was left 09/19/17.

## 2017-09-23 ENCOUNTER — Encounter: Payer: Self-pay | Admitting: Neurology

## 2017-09-23 ENCOUNTER — Ambulatory Visit (INDEPENDENT_AMBULATORY_CARE_PROVIDER_SITE_OTHER): Payer: Medicare Other | Admitting: Neurology

## 2017-09-23 VITALS — BP 131/85 | HR 67

## 2017-09-23 DIAGNOSIS — G4710 Hypersomnia, unspecified: Secondary | ICD-10-CM | POA: Diagnosis not present

## 2017-09-23 DIAGNOSIS — G4719 Other hypersomnia: Secondary | ICD-10-CM

## 2017-09-23 DIAGNOSIS — G44019 Episodic cluster headache, not intractable: Secondary | ICD-10-CM | POA: Diagnosis not present

## 2017-09-23 DIAGNOSIS — G4730 Sleep apnea, unspecified: Secondary | ICD-10-CM

## 2017-09-23 DIAGNOSIS — R519 Headache, unspecified: Secondary | ICD-10-CM

## 2017-09-23 DIAGNOSIS — F1510 Other stimulant abuse, uncomplicated: Secondary | ICD-10-CM

## 2017-09-23 DIAGNOSIS — R51 Headache: Secondary | ICD-10-CM | POA: Diagnosis not present

## 2017-09-23 DIAGNOSIS — Z6841 Body Mass Index (BMI) 40.0 and over, adult: Secondary | ICD-10-CM

## 2017-09-23 NOTE — Patient Instructions (Signed)
Hypersomnia Hypersomnia is when you feel extremely tired during the day even though you're getting plenty of sleep at night. You may need to take naps during the day, and you may also be extremely difficult to wake up when you are sleeping. What are the causes? The cause of your hypersomnia may not be known. Hypersomnia may be caused by:  Medicines.  Sleep disorders, such as narcolepsy.  Trauma or injury to your head or nervous system.  Using drugs or alcohol.  Tumors.  Medical conditions, such as depression or hypothyroidism.  Genetics.  What are the signs or symptoms? The main symptoms of hypersomnia include:  Feeling extremely tired throughout the day.  Being very difficult to wake up.  Sleeping for longer and longer periods.  Taking naps throughout the day.  Other symptoms may include:  Feeling: ? Restless. ? Annoyed. ? Anxious. ? Low energy.  Having difficulty: ? Remembering. ? Speaking. ? Thinking.  Losing your appetite.  Experiencing hallucinations.  How is this diagnosed? Hypersomnia may be diagnosed by:  Medical history and physical exam. This will include a sleep history.  Completing sleep logs.  Tests may also be done, such as: ? Polysomnography. ? Multiple sleep latency test (MSLT).  How is this treated? There is no cure for hypersomnia, but treatment can be very effective in helping manage the condition. Treatment may include:  Lifestyle and sleeping strategies to help cope with the condition.  Stimulant medicines.  Treating any underlying causes of hypersomnia.  Follow these instructions at home:  Take medicines only as directed by your health care provider.  Schedule short naps for when you feel sleepiest during the day. Tell your employer or teachers that you have hypersomnia. You may be able to adjust your schedule to include time for naps.  Avoid drinking alcohol or caffeinated beverages.  Do not eat a heavy meal before  bedtime. Eat at about the same times every day.  Do not drive or operate heavy machinery if you are sleepy.  Do not swim or go out on the water without a life jacket.  If possible, adjust your schedule so that you do not have to work or be active at night.  Keep all follow-up visits as directed by your health care provider. This is important. Contact a health care provider if:  You have new symptoms.  Your symptoms get worse. Get help right away if: You have serious thoughts of hurting yourself or someone else. This information is not intended to replace advice given to you by your health care provider. Make sure you discuss any questions you have with your health care provider. Document Released: 01/18/2002 Document Revised: 07/06/2015 Document Reviewed: 09/02/2013 Elsevier Interactive Patient Education  2018 Elsevier Inc.  
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
## 2017-09-24 ENCOUNTER — Ambulatory Visit (HOSPITAL_COMMUNITY): Payer: Medicare Other | Admitting: Occupational Therapy

## 2017-09-24 ENCOUNTER — Encounter (HOSPITAL_COMMUNITY): Payer: Self-pay | Admitting: Occupational Therapy

## 2017-09-24 DIAGNOSIS — M25522 Pain in left elbow: Secondary | ICD-10-CM

## 2017-09-24 DIAGNOSIS — M5416 Radiculopathy, lumbar region: Secondary | ICD-10-CM | POA: Diagnosis not present

## 2017-09-24 DIAGNOSIS — R29898 Other symptoms and signs involving the musculoskeletal system: Secondary | ICD-10-CM

## 2017-09-24 DIAGNOSIS — M6281 Muscle weakness (generalized): Secondary | ICD-10-CM | POA: Diagnosis not present

## 2017-09-24 DIAGNOSIS — R262 Difficulty in walking, not elsewhere classified: Secondary | ICD-10-CM | POA: Diagnosis not present
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
## 2017-09-25 ENCOUNTER — Encounter (HOSPITAL_COMMUNITY): Payer: Self-pay | Admitting: Internal Medicine

## 2017-09-25 ENCOUNTER — Telehealth (HOSPITAL_COMMUNITY): Payer: Self-pay | Admitting: Family Medicine

## 2017-09-25 ENCOUNTER — Encounter (HOSPITAL_COMMUNITY): Payer: Self-pay

## 2017-09-25 ENCOUNTER — Ambulatory Visit (HOSPITAL_COMMUNITY): Payer: Medicare Other

## 2017-09-25 NOTE — Telephone Encounter (Signed)
09/25/17  pt called and wanted to reschedule the appt.

## 2017-10-02 LAB — NARCOLEPSY EVALUATION
HLA-DQ ALPHA: NEGATIVE
HLA-DQ BETA: NEGATIVE

## 2017-10-03 ENCOUNTER — Telehealth: Payer: Self-pay | Admitting: Neurology

## 2017-10-03 NOTE — Telephone Encounter (Signed)
Called the patient and reviewed the lab work was negative for narcolepsy gene. Informed her this doesn't mean that she doesn't have narcolepsy it just means that she doesn't carry the gene. Pt is scheduled to come in and have the sleep study on 10/07/17. Informed her that I will call with those results once Dr Brett Fairy has read the study. Pt verbalized understanding.

## 2017-10-03 NOTE — Telephone Encounter (Signed)
-----  Message from Larey Seat, MD sent at 10/02/2017  4:46 PM EDT ----- Negative for HLA/ Alleles associated with narcolepsy.

## 2017-10-07 ENCOUNTER — Ambulatory Visit (INDEPENDENT_AMBULATORY_CARE_PROVIDER_SITE_OTHER): Payer: Medicare Other | Admitting: Neurology

## 2017-10-07 ENCOUNTER — Encounter (HOSPITAL_COMMUNITY): Payer: Self-pay | Admitting: Physical Therapy

## 2017-10-07 ENCOUNTER — Ambulatory Visit (HOSPITAL_COMMUNITY): Payer: Medicare Other | Admitting: Physical Therapy

## 2017-10-07 ENCOUNTER — Other Ambulatory Visit: Payer: Self-pay

## 2017-10-07 DIAGNOSIS — M6281 Muscle weakness (generalized): Secondary | ICD-10-CM | POA: Diagnosis not present

## 2017-10-07 DIAGNOSIS — R51 Headache: Secondary | ICD-10-CM

## 2017-10-07 DIAGNOSIS — M25522 Pain in left elbow: Secondary | ICD-10-CM | POA: Diagnosis not present

## 2017-10-07 DIAGNOSIS — R29898 Other symptoms and signs involving the musculoskeletal system: Secondary | ICD-10-CM | POA: Diagnosis not present

## 2017-10-07 DIAGNOSIS — G44019 Episodic cluster headache, not intractable: Secondary | ICD-10-CM

## 2017-10-07 DIAGNOSIS — F1510 Other stimulant abuse, uncomplicated: Secondary | ICD-10-CM

## 2017-10-07 DIAGNOSIS — M5416 Radiculopathy, lumbar region: Secondary | ICD-10-CM

## 2017-10-07 DIAGNOSIS — R519 Headache, unspecified: Secondary | ICD-10-CM

## 2017-10-07 DIAGNOSIS — Z6841 Body Mass Index (BMI) 40.0 and over, adult: Secondary | ICD-10-CM

## 2017-10-07 DIAGNOSIS — R262 Difficulty in walking, not elsewhere classified: Secondary | ICD-10-CM | POA: Diagnosis not present

## 2017-10-07 DIAGNOSIS — G4710 Hypersomnia, unspecified: Secondary | ICD-10-CM | POA: Diagnosis not present

## 2017-10-07 DIAGNOSIS — G4730 Sleep apnea, unspecified: Secondary | ICD-10-CM | POA: Diagnosis not present

## 2017-10-07 DIAGNOSIS — G4719 Other hypersomnia: Secondary | ICD-10-CM

## 2017-10-07 DIAGNOSIS — R0683 Snoring: Secondary | ICD-10-CM
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
## 2017-10-07 NOTE — Patient Instructions (Addendum)
Stretching: Hamstring (Supine)    Supporting right thigh behind knee, slowly straighten knee until stretch is felt in back of thigh. Hold ____ seconds. Repeat ____ times per set. Do ____ sets per session. Do ____ sessions per day.  http://orth.exer.us/656   Copyright  VHI. All rights reserved.  Backward Bend (Standing)    Arch backward to make hollow of back deeper. Hold ___3_ seconds. Repeat __5__ times per set. Do __1__ sets per session. Do ___2_ sessions per day.  http://orth.exer.us/178   Copyright  VHI. All rights reserved.  Thoracolumbar Side-Bend: Single Arm (Standing)    Reach over head to other side with right arm until stretch is felt. Hold _3__ seconds. Relax. Repeat _10___ times per set. Do __1__ sets per session. Do 2____ sessions per day.  http://orth.exer.us/262   Copyright  VHI. All rights reserved.  Isometric Abdominal    Lying on back with knees bent, tighten stomach by pressing elbows down. Hold __3-5__ seconds. Repeat __10__ times per set. Do _1__ sets per session. Do __3__ sessions per day.  http://orth.exer.us/1086   Copyright  VHI. All rights reserved.  Bridging    Slowly raise buttocks from floor, keeping stomach tight. Repeat _10___ times per set. Do 1____ sets per session. Do __2__ sessions per day.  http://orth.exer.us/1096   Copyright  VHI. All rights reserved.

## 2017-10-09 ENCOUNTER — Telehealth (HOSPITAL_COMMUNITY): Payer: Self-pay | Admitting: Physical Therapy

## 2017-10-09 ENCOUNTER — Ambulatory Visit (HOSPITAL_COMMUNITY): Payer: Medicare Other | Admitting: Physical Therapy

## 2017-10-09 NOTE — Telephone Encounter (Signed)
Pt did not show for appt (NS#1).  Attempted to call, however phone is blocked from receiving calls. Teena Irani, PTA/CLT [phone]

## 2017-10-10 DIAGNOSIS — Z6841 Body Mass Index (BMI) 40.0 and over, adult: Secondary | ICD-10-CM

## 2017-10-10 DIAGNOSIS — F1510 Other stimulant abuse, uncomplicated: Secondary | ICD-10-CM | POA: Insufficient documentation

## 2017-10-10 DIAGNOSIS — G4719 Other hypersomnia: Secondary | ICD-10-CM | POA: Insufficient documentation

## 2017-10-10 DIAGNOSIS — R51 Headache: Secondary | ICD-10-CM

## 2017-10-10 DIAGNOSIS — G4489 Other headache syndrome: Secondary | ICD-10-CM | POA: Insufficient documentation

## 2017-10-10 DIAGNOSIS — R0683 Snoring: Secondary | ICD-10-CM | POA: Insufficient documentation

## 2017-10-10 HISTORY — DX: Other stimulant abuse, uncomplicated: F15.10
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
## 2017-10-14 ENCOUNTER — Other Ambulatory Visit: Payer: Self-pay | Admitting: Family Medicine

## 2017-10-14 ENCOUNTER — Ambulatory Visit: Payer: Medicare Other | Admitting: Gastroenterology

## 2017-10-15 ENCOUNTER — Telehealth: Payer: Self-pay | Admitting: Neurology

## 2017-10-15 ENCOUNTER — Telehealth (HOSPITAL_COMMUNITY): Payer: Self-pay | Admitting: Family Medicine

## 2017-10-15 ENCOUNTER — Ambulatory Visit (INDEPENDENT_AMBULATORY_CARE_PROVIDER_SITE_OTHER): Payer: Medicare Other | Admitting: Family Medicine

## 2017-10-15 ENCOUNTER — Ambulatory Visit (HOSPITAL_COMMUNITY): Payer: Medicare Other | Admitting: Physical Therapy

## 2017-10-15 ENCOUNTER — Encounter: Payer: Self-pay | Admitting: Family Medicine

## 2017-10-15 VITALS — BP 117/73 | HR 80 | Temp 98.0°F | Ht 67.0 in | Wt 252.0 lb

## 2017-10-15 DIAGNOSIS — J4540 Moderate persistent asthma, uncomplicated: Secondary | ICD-10-CM

## 2017-10-15 DIAGNOSIS — G4734 Idiopathic sleep related nonobstructive alveolar hypoventilation: Secondary | ICD-10-CM

## 2017-10-15 DIAGNOSIS — S069X5D Unspecified intracranial injury with loss of consciousness greater than 24 hours with return to pre-existing conscious level, subsequent encounter: Secondary | ICD-10-CM

## 2017-10-15 DIAGNOSIS — F419 Anxiety disorder, unspecified: Secondary | ICD-10-CM | POA: Diagnosis not present

## 2017-10-15 MED ORDER — GABAPENTIN 300 MG PO CAPS: ORAL_CAPSULE | ORAL | 2 refills | Status: DC

## 2017-10-15 MED ORDER — ALPRAZOLAM 0.5 MG PO TABS: ORAL_TABLET | ORAL | 2 refills | Status: DC

## 2017-10-15 MED ORDER — FLUTICASONE-UMECLIDIN-VILANT 100-62.5-25 MCG/INH IN AEPB: 1.0000 | INHALATION_SPRAY | Freq: Every day | RESPIRATORY_TRACT | 5 refills | Status: DC

## 2017-10-15 MED ORDER — DULOXETINE HCL 30 MG PO CPEP: ORAL_CAPSULE | ORAL | 1 refills | Status: DC

## 2017-10-15 MED ORDER — HYDROXYZINE HCL 25 MG PO TABS: ORAL_TABLET | ORAL | 2 refills | Status: DC

## 2017-10-15 MED ORDER — LINACLOTIDE 145 MCG PO CAPS: ORAL_CAPSULE | ORAL | 2 refills | Status: DC

## 2017-10-15 MED ORDER — FLUOXETINE HCL (PMDD) 20 MG PO CAPS: ORAL_CAPSULE | ORAL | 1 refills | Status: DC

## 2017-10-15 NOTE — Telephone Encounter (Signed)
Called and reviewed the sleep study with the patient and informed her that overall sleep study was great in that she slept good through the nightt. Informed her that her oxygen levels dropped and remained low throughout the test for majority of the time. Informed her that snring was noted. Advised the pt that Dr Brett Fairy would recommend the pt have a referral for pulmonology placed to assess the hypoxemia. Pt states that her PCP has placed this referral. Patient asked that I mail a copy of the sleep study results for her. Confirmed her address and will place in the mail today. Advised that at this time there would be no need to follow up in the sleep clinic but informed her if she had questions to contact us. Pt verbalized understanding. Pt had no questions at this time but was encouraged to call back if questions arise.

## 2017-10-15 NOTE — Progress Notes (Signed)
Subjective:  Patient ID: Shelby [name], female    DOB: [birth year]  Age: 42 y.o. MRN: [id number]  CC: Medical Management of Chronic Issues (Wants the quantity of xanax increased because she is taking 1.5 tabs)   HPI Shelby [name] presents for recheck of her asthma and hypoxia.  She had sleep study /overnight oximetry showing 347 minutes of 89% sat or below in a total of 369 minutes.  Patient was noted to not have insomnia that night.  She says that she took an hour and a half of tossing and turning to get to sleep.  Of note is that she did have some restlessness during her non-REM sleep.  She did have mild sleep apnea noted.    Pt. reports dyspnea when she walks the dogs in the morning.  She will use her rescue inhaler then.  Additionally she is followed for her psychiatric needs and states her insurance will not cover a psychiatry consultation.  She is taking Cymbalta and Abilify regularly.  She is concerned that she does not sleep well as long as she does not have her Xanax she needs 1-1/2 tablets dailyAt bedtime.  Depression screen [facility] 2/9 10/15/2017 08/29/2017 07/15/2017  Decreased Interest 0 0 0  Down, Depressed, Hopeless 0 0 0  PHQ - 2 Score 0 0 0  Altered sleeping - - -  Tired, decreased energy - - -  Change in appetite - - -  Feeling bad or failure about yourself  - - -  Trouble concentrating - - -  Moving slowly or fidgety/restless - - -  Suicidal thoughts - - -  PHQ-9 Score - - -  Difficult doing work/chores - - -  Some recent data might be hidden    History Shelby [name] has a past medical history of Anxiety, Arthritis, Asthma, Bad memory, Bipolar disorder ([place]), Chronic kidney disease, Depression, Endometriosis, Migraines, and Traumatic brain injury ([place]) (2001).   She has a past surgical history that includes Cesarean section; cyst remove; uterine ablation; Tubal ligation; Umbilical hernia repair (09/20/2011); Incisional hernia repair (12/27/2011); Wound debridement (12/27/2011);  Insertion of mesh (12/27/2011); Hand surgery; Abdominal hysterectomy (06/2017); and Colonoscopy with propofol (N/A, 09/22/2017).   Her family history includes Alzheimer's disease in her maternal grandmother and paternal grandmother; Colon cancer in her paternal grandfather; Diabetes in her father; Healthy in her daughter and daughter.She reports that she has been smoking cigarettes. She has a 25.00 pack-year smoking history. She has never used smokeless tobacco. She reports that she does not drink alcohol or use drugs.    ROS Review of Systems  Constitutional: Negative.   HENT: Negative for congestion.   Eyes: Negative for visual disturbance.  Respiratory: Positive for shortness of breath. Negative for cough, choking and chest tightness.   Cardiovascular: Negative for chest pain.  Gastrointestinal: Negative for abdominal pain, constipation, diarrhea, nausea and vomiting.  Genitourinary: Negative for difficulty urinating.  Musculoskeletal: Negative for arthralgias and myalgias.  Neurological: Negative for headaches.  Psychiatric/Behavioral: Negative for sleep disturbance.    Objective:  BP 117/73   Pulse 80   Temp 98 F (36.7 C) (Oral)   Ht 5' 7"  (1.702 m)   Wt 252 lb (114.3 kg)   LMP 05/12/2016   BMI 39.47 kg/m   BP Readings from Last 3 Encounters:  10/15/17 117/73  09/23/17 131/85  09/22/17 116/72    Wt Readings from Last 3 Encounters:  10/15/17 252 lb (114.3 kg)  09/22/17 259 lb (117.5 kg)  09/16/17 259 lb (  117.5 kg)     Physical Exam  Constitutional: She is oriented to person, place, and time. She appears well-developed and well-nourished. No distress.  HENT:  Head: Normocephalic and atraumatic.  Right Ear: External ear normal.  Left Ear: External ear normal.  Nose: Nose normal.  Mouth/Throat: Oropharynx is clear and moist.  Eyes: Pupils are equal, round, and reactive to light. Conjunctivae and EOM are normal.  Neck: Normal range of motion. Neck supple. No  thyromegaly present.  Cardiovascular: Normal rate, regular rhythm and normal heart sounds.  No murmur heard. Pulmonary/Chest: Effort normal and breath sounds normal. No respiratory distress. She has no wheezes. She has no rales.  Abdominal: Soft. Bowel sounds are normal. She exhibits no distension. There is no tenderness.  Lymphadenopathy:    She has no cervical adenopathy.  Neurological: She is alert and oriented to person, place, and time. She has normal reflexes.  Skin: Skin is warm and dry.  Psychiatric: Her speech is normal. Her affect is blunt. She is slowed. Cognition and memory are normal.      Assessment & Plan:   [name] was seen today for medical management of chronic issues.  Diagnoses and all orders for this visit:  Moderate persistent asthma, unspecified whether complicated  Nocturnal hypoxia -     Ambulatory referral to Pulmonology  Traumatic brain injury, with loss of consciousness greater than 24 hours with return to pre-existing conscious level, subsequent encounter  Anxiety  Other orders -     Fluticasone-Umeclidin-Vilant (TRELEGY ELLIPTA) 100-62.5-25 MCG/INH AEPB; Inhale 1 puff into the lungs daily. -     linaclotide (LINZESS) 145 MCG CAPS capsule; TAKE 1 CAPSULE(145 MCG) BY MOUTH DAILY BEFORE BREAKFAST -     hydrOXYzine (ATARAX/VISTARIL) 25 MG tablet; TAKE 1 TABLET(25 MG) BY MOUTH TID DAILY AS NEEDED FOR ANXIETY -     gabapentin (NEURONTIN) 300 MG capsule; gabapentin 300 mg capsule  TK 1 C PO TID PRN -     Fluoxetine HCl, PMDD, 20 MG CAPS; TAKE 3 CAPSULES (60 MG) BY MOUTH EVERY DAY AT NIGHT. -     DULoxetine (CYMBALTA) 30 MG capsule; TK 1 C PO QD -     ALPRAZolam (XANAX) 0.5 MG tablet; Take 1 and 1/2 tablets QHS       I have discontinued Shelby [name]'s fluticasone furoate-vilanterol. I have changed her LINZESS to linaclotide. I have also changed her Fluoxetine HCl (PMDD). Additionally, I am having her start on Fluticasone-Umeclidin-Vilant. Lastly,  I am having her maintain her acetaminophen, cyclobenzaprine, Na Sulfate-K Sulfate-Mg Sulf, ARIPiprazole, ibuprofen, albuterol, hydrOXYzine, gabapentin, DULoxetine, and ALPRAZolam.  Allergies as of 10/15/2017      Reactions   Chantix [varenicline] Other (See Comments)   Nightmares   Penicillins Other (See Comments)   Unknown Has patient had a PCN reaction causing immediate rash, facial/tongue/throat swelling, SOB or lightheadedness with hypotension: Unknown Has patient had a PCN reaction causing severe rash involving mucus membranes or skin necrosis: Unknown Has patient had a PCN reaction that required hospitalization: Unknown Has patient had a PCN reaction occurring within the last 10 years: No If all of the above answers are "NO", then may proceed with Cephalosporin use.   Latex Rash      Medication List        Accurate as of 10/15/17  2:44 PM. Always use your most recent med list.          acetaminophen 650 MG CR tablet Commonly known as:  TYLENOL Take 1,300 mg by  mouth 2 (two) times daily as needed for pain.   albuterol 108 (90 Base) MCG/ACT inhaler Commonly known as:  PROVENTIL HFA;VENTOLIN HFA INHALE 2 PUFFS BY MOUTH EVERY 6 HOURS AS NEEDED FOR WHEEZING OR SHORTNESS OF BREATH   ALPRAZolam 0.5 MG tablet Commonly known as:  XANAX Take 1 and 1/2 tablets QHS   ARIPiprazole 10 MG tablet Commonly known as:  ABILIFY Take 10 mg by mouth at bedtime.   cyclobenzaprine 10 MG tablet Commonly known as:  FLEXERIL Take 1 tablet (10 mg total) by mouth 3 (three) times daily as needed for muscle spasms.   DULoxetine 30 MG capsule Commonly known as:  CYMBALTA TK 1 C PO QD   Fluoxetine HCl (PMDD) 20 MG Caps TAKE 3 CAPSULES (60 MG) BY MOUTH EVERY DAY AT NIGHT.   Fluticasone-Umeclidin-Vilant 100-62.5-25 MCG/INH Aepb Inhale 1 puff into the lungs daily.   gabapentin 300 MG capsule Commonly known as:  NEURONTIN gabapentin 300 mg capsule  TK 1 C PO TID PRN   hydrOXYzine 25 MG  tablet Commonly known as:  ATARAX/VISTARIL TAKE 1 TABLET(25 MG) BY MOUTH TID DAILY AS NEEDED FOR ANXIETY   ibuprofen 200 MG tablet Commonly known as:  ADVIL,MOTRIN Take 400 mg by mouth every 8 (eight) hours as needed (for inflammation.).   linaclotide 145 MCG Caps capsule Commonly known as:  LINZESS TAKE 1 CAPSULE(145 MCG) BY MOUTH DAILY BEFORE BREAKFAST   Na Sulfate-K Sulfate-Mg Sulf 17.5-3.13-1.6 GM/177ML Soln Take 1 kit by mouth as directed.        Follow-up: Return in about 3 months (around 01/14/2018).  Claretta Fraise, M.D.

## 2017-10-15 NOTE — Telephone Encounter (Signed)
-----  Message from Larey Seat, MD sent at 10/10/2017  1:50 PM EDT ----- IMPRESSION: No evidence of Insomnia.   1. The patient slept well and soundly, but remained hypoxic  during sleep, worsened by REM sleep.  2. Prolonged and sustained hypoxemia for over 80% of the recorded  time and not apnea related.  3. Mild Obstructive Sleep Apnea (OSA) 4. Non- periodic Movements in sleep. 5. Primary Snoring noted as ongoing while in non-supine position.   RECOMMENDATIONS:   1. Advise pulmonology referral for asthma, in the setting of  morbid obesity and continued tobacco abuse. Pulmonary Function  test to assess need for oxygen. 2. Hypersomnia may correlate to pulmonary function and  psychiatric disorder, please address with the respective  specialists.  3. HLA narcolepsy test was negative - No need for follow up with  Sleep neurology.

## 2017-10-15 NOTE — Addendum Note (Signed)
Addended by: Rolena Infante on: 10/15/2017 02:46 PM   Modules accepted: Orders

## 2017-10-15 NOTE — Telephone Encounter (Signed)
10/15/17  pt cx because she is sick

## 2017-10-21 ENCOUNTER — Telehealth (HOSPITAL_COMMUNITY): Payer: Self-pay | Admitting: Family Medicine

## 2017-10-21 ENCOUNTER — Telehealth (HOSPITAL_COMMUNITY): Payer: Self-pay

## 2017-10-21 ENCOUNTER — Ambulatory Visit (HOSPITAL_COMMUNITY): Payer: Medicare Other

## 2017-10-21 NOTE — Telephone Encounter (Signed)
Pt called to confrim apptment was cx for today- it wasn't. Cx today  and move 9/12/ to tomorrow at 9:45 on 9/11/19and changed all future apptments after 2pm per pt's request.

## 2017-10-21 NOTE — Telephone Encounter (Signed)
9/10  Pt had cancelled via phone tree so I called to leave a her a message to confirm that along with letting her know that the 9/12 appt 1:45 was changed to 2:30 in order to work in a patient.  I asked that she call back to confirm she got my message.

## 2017-10-22 ENCOUNTER — Telehealth (HOSPITAL_COMMUNITY): Payer: Self-pay | Admitting: Family Medicine

## 2017-10-22 ENCOUNTER — Telehealth: Payer: Self-pay | Admitting: Neurology

## 2017-10-22 ENCOUNTER — Other Ambulatory Visit: Payer: Self-pay | Admitting: Family Medicine

## 2017-10-22 ENCOUNTER — Ambulatory Visit (HOSPITAL_COMMUNITY): Payer: Medicare Other

## 2017-10-22 MED ORDER — SERTRALINE HCL 100 MG PO TABS: 100.0000 mg | ORAL_TABLET | Freq: Every day | ORAL | 5 refills | Status: DC

## 2017-10-22 NOTE — Telephone Encounter (Signed)
Called the patient and informed her that her oxygen level dropped significantly during the sleep study. Dr Dohmeier would strongly encourage the patient get her work up completed with pulmonology to see if that is any help with the memory once she is started on oxygen to help her. Patient states that she hasn't heard anything from them in regards to setting this apt up. I have given her the number to [facility] and informed her that should hopefully be in contact with her soon. I informed her that I would also follow up with our referrals dept and see if we can check on the status. Pt verbalized understanding and was appreciative

## 2017-10-22 NOTE — Telephone Encounter (Signed)
Pt called stating she is having more issues with her memory. Requesting a call to discuss if Dr. Brett Fairy will prescribe anything to help. Please advise

## 2017-10-22 NOTE — Telephone Encounter (Signed)
10/22/17  pt left a message to cx said that her daughter is home sick and she can't come today

## 2017-10-23 ENCOUNTER — Other Ambulatory Visit: Payer: Self-pay | Admitting: Neurology

## 2017-10-23 ENCOUNTER — Encounter (HOSPITAL_COMMUNITY): Payer: Self-pay

## 2017-10-23 DIAGNOSIS — G4719 Other hypersomnia: Secondary | ICD-10-CM

## 2017-10-23 DIAGNOSIS — G4730 Sleep apnea, unspecified: Secondary | ICD-10-CM

## 2017-10-23 DIAGNOSIS — Z780 Asymptomatic menopausal state: Secondary | ICD-10-CM | POA: Diagnosis not present

## 2017-10-23 DIAGNOSIS — G44019 Episodic cluster headache, not intractable: Secondary | ICD-10-CM

## 2017-10-23 DIAGNOSIS — G4710 Hypersomnia, unspecified: Secondary | ICD-10-CM

## 2017-10-23 DIAGNOSIS — R51 Headache: Secondary | ICD-10-CM

## 2017-10-23 DIAGNOSIS — R519 Headache, unspecified: Secondary | ICD-10-CM

## 2017-10-23 DIAGNOSIS — Z6841 Body Mass Index (BMI) 40.0 and over, adult: Secondary | ICD-10-CM

## 2017-10-23 NOTE — Telephone Encounter (Signed)
I have contacted [name] Patient's apt is Oct 8 th arrive at 3:15 for 3:30 . I will call and make sure patient is aware.   Shelby [name], [facility] went ahead and scheduled apt . But they need a referral from this office . Thanks Hinton Dyer .

## 2017-10-23 NOTE — Telephone Encounter (Signed)
Order placed from our office

## 2017-10-27 ENCOUNTER — Telehealth (HOSPITAL_COMMUNITY): Payer: Self-pay | Admitting: Family Medicine

## 2017-10-27 NOTE — Telephone Encounter (Signed)
10/27/17  pt left a message to cx this appt... she has another appt scheduled at same time

## 2017-10-28 ENCOUNTER — Ambulatory Visit (HOSPITAL_COMMUNITY): Payer: Medicare Other | Admitting: Physical Therapy

## 2017-10-30 ENCOUNTER — Ambulatory Visit (HOSPITAL_COMMUNITY): Payer: Medicare Other | Attending: Orthopedic Surgery | Admitting: Physical Therapy

## 2017-10-30 DIAGNOSIS — M5416 Radiculopathy, lumbar region: Secondary | ICD-10-CM | POA: Insufficient documentation

## 2017-10-30 DIAGNOSIS — R262 Difficulty in walking, not elsewhere classified: Secondary | ICD-10-CM | POA: Diagnosis not present
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
## 2017-11-04 ENCOUNTER — Telehealth (HOSPITAL_COMMUNITY): Payer: Self-pay | Admitting: Physical Therapy

## 2017-11-04 ENCOUNTER — Ambulatory Visit (HOSPITAL_COMMUNITY): Payer: Medicare Other | Admitting: Physical Therapy

## 2017-11-04 NOTE — Telephone Encounter (Signed)
Pt called re missed appointment. PT states that she had forgotten and will call back to make more appointments.   Rayetta Humphrey, [place] CLT [phone]

## 2017-11-18 ENCOUNTER — Ambulatory Visit: Payer: Medicare Other | Admitting: Gastroenterology

## 2017-11-18 ENCOUNTER — Ambulatory Visit (INDEPENDENT_AMBULATORY_CARE_PROVIDER_SITE_OTHER): Payer: Medicare Other | Admitting: Pulmonary Disease

## 2017-11-18 ENCOUNTER — Encounter: Payer: Self-pay | Admitting: Pulmonary Disease

## 2017-11-18 VITALS — BP 118/76 | HR 80 | Ht 68.0 in | Wt 254.0 lb

## 2017-11-18 DIAGNOSIS — G4734 Idiopathic sleep related nonobstructive alveolar hypoventilation: Secondary | ICD-10-CM | POA: Diagnosis not present

## 2017-11-18 DIAGNOSIS — Z23 Encounter for immunization: Secondary | ICD-10-CM

## 2017-11-18 DIAGNOSIS — R0609 Other forms of dyspnea: Secondary | ICD-10-CM

## 2017-11-18 MED ORDER — ALBUTEROL SULFATE HFA 108 (90 BASE) MCG/ACT IN AERS: 2.0000 | INHALATION_SPRAY | Freq: Four times a day (QID) | RESPIRATORY_TRACT | 6 refills | Status: DC | PRN

## 2017-11-18 NOTE — Progress Notes (Signed)
   Subjective:    Patient ID: Shelby [name], female    DOB: 10/28/1975, 42 y.o.   MRN: [id number] CC: Abnormal polysomnogram showing nocturnal hypoxemia Very mild obstructive sleep apnea-below treatment threshold  HPI: History of snoring, history of daytime sleepiness Recently had a sleep study showing an AHI less than 5, however, did have significant hypoxemia throughout the night Has underlying history of asthma-uses albuterol No other diagnosed pulmonary disease No history of lung disease/heart disease  No significant shortness of breath with moderate exertion  She is an active smoker-trying to quit  History of GERD, history of traumatic brain injury History of chronic back pain-she did gain significant amount of weight with exacerbation of chronic back pain  Review of Systems  Constitutional: Negative for fever and unexpected weight change.  HENT: Positive for congestion, dental problem, sore throat and trouble swallowing. Negative for ear pain, nosebleeds, postnasal drip, rhinorrhea, sinus pressure and sneezing.   Eyes: Negative for redness and itching.  Respiratory: Positive for cough, choking, shortness of breath and wheezing. Negative for chest tightness.   Cardiovascular: Negative for palpitations and leg swelling.  Gastrointestinal: Negative for nausea and vomiting.  Genitourinary: Negative for dysuria.  Musculoskeletal: Negative for joint swelling.  Skin: Negative for rash.  Allergic/Immunologic: Positive for environmental allergies. Negative for food allergies and immunocompromised state.  Neurological: Positive for headaches.  Hematological: Does not bruise/bleed easily.  Psychiatric/Behavioral: Positive for dysphoric mood. The patient is nervous/anxious.        Objective:   Physical Exam        Assessment & Plan:

## 2017-11-18 NOTE — Patient Instructions (Addendum)
Nocturnal hypoxemia on recent sleep study  Active smoker with a history of asthma  Very mild sleep apnea that did not meet threshold for treatment  We will obtain an overnight oximetry  Obtain echocardiogram to assess for pulmonary hypertension  We will obtain an arterial blood gas to check for hypoxemia  May have obesity hypoventilation  Continue inhaler use, optimize technique-this was reviewed during the visit  Regular exercise, weight loss efforts  I will see you back in the office in about 2 months  Smoking cessation

## 2017-11-22 ENCOUNTER — Encounter: Payer: Self-pay | Admitting: Pulmonary Disease

## 2017-11-22 DIAGNOSIS — R0902 Hypoxemia: Secondary | ICD-10-CM | POA: Diagnosis not present

## 2017-11-25 ENCOUNTER — Other Ambulatory Visit (HOSPITAL_COMMUNITY): Payer: Self-pay

## 2017-11-28 ENCOUNTER — Ambulatory Visit (HOSPITAL_COMMUNITY): Payer: Medicare Other | Attending: Cardiology

## 2017-11-28 ENCOUNTER — Other Ambulatory Visit: Payer: Self-pay

## 2017-11-28 DIAGNOSIS — R0609 Other forms of dyspnea: Secondary | ICD-10-CM | POA: Insufficient documentation

## 2017-12-01 ENCOUNTER — Ambulatory Visit (HOSPITAL_COMMUNITY)
Admission: RE | Admit: 2017-12-01 | Discharge: 2017-12-01 | Disposition: A | Discharge status: Home / Self Care | Payer: Medicare Other | Source: Ambulatory Visit | Attending: Pulmonary Disease | Admitting: Pulmonary Disease

## 2017-12-01 DIAGNOSIS — R0609 Other forms of dyspnea: Secondary | ICD-10-CM | POA: Insufficient documentation

## 2017-12-01 LAB — BLOOD GAS, ARTERIAL
ACID-BASE DEFICIT: 0.3 mmol/L (ref 0.0–2.0)
Bicarbonate: 23.2 mmol/L (ref 20.0–28.0)
Drawn by: 20517
FIO2: 21
O2 SAT: 96 %
PCO2 ART: 33.6 mmHg (ref 32.0–48.0)
PO2 ART: 73.9 mmHg — AB (ref 83.0–108.0)
Patient temperature: 98.6
pH, Arterial: 7.454 — ABNORMAL HIGH (ref 7.350–7.450)

## 2017-12-04 ENCOUNTER — Telehealth: Payer: Self-pay | Admitting: Pulmonary Disease

## 2017-12-04 DIAGNOSIS — G4734 Idiopathic sleep related nonobstructive alveolar hypoventilation: Secondary | ICD-10-CM

## 2017-12-04 NOTE — Telephone Encounter (Signed)
Called and spoke with pt who was requesting to know the results of her labwork and echocardiogram.  Dr. Ander Slade, please advise on this for pt. Thanks!

## 2017-12-05 NOTE — Addendum Note (Signed)
Addended by: Maryanna Shape A on: 12/05/2017 03:34 PM   Modules accepted: Orders

## 2017-12-05 NOTE — Telephone Encounter (Signed)
ONO results per AO- start 3L qhs and repeat ONO on 3L. Pt is aware and voiced her understanding.  ONO has been placed.  Order has been placed to [employer] to increase O2 to 3L qhs. Nothing further is needed.

## 2017-12-05 NOTE — Telephone Encounter (Signed)
Called and made patient aware of results per OA. Voiced understanding. Nothing further is needed at this time.

## 2017-12-05 NOTE — Telephone Encounter (Signed)
Echocardiogram within normal limits  ABG within normal limits

## 2017-12-10 ENCOUNTER — Encounter (HOSPITAL_COMMUNITY): Payer: Self-pay | Admitting: Occupational Therapy

## 2017-12-10 NOTE — Therapy (Signed)
[facility] [address] [place], [place], [address] Phone: [phone]   Fax:  [phone]  Patient Details  Name: Shelby [name] MRN: [id number] Date of Birth: 13-May-1975 Referring Provider:  No ref. provider found  Encounter Date: 12/10/2017   OCCUPATIONAL THERAPY DISCHARGE SUMMARY  Visits from Start of Care: 7  Current functional level related to goals / functional outcomes: Unknown. Pt has not returned to therapy since last visit on 09/24/17.    Remaining deficits: At last visit continued difficulty occasionally with pain and weakness in forearm and hand.    Education / Equipment: HEP for strengthening, pain management Plan: Patient agrees to discharge.  Patient goals were partially met. Patient is being discharged due to not returning since the last visit.  ?????     Guadelupe Sabin, OTR/L  [phone] 12/10/2017, 10:06 AM  [facility] [address] [place], [place], [address] Phone: [phone]   Fax:  [phone]

## 2017-12-18 ENCOUNTER — Telehealth: Payer: Self-pay | Admitting: Pulmonary Disease

## 2017-12-18 DIAGNOSIS — G4734 Idiopathic sleep related nonobstructive alveolar hypoventilation: Secondary | ICD-10-CM

## 2017-12-18 NOTE — Telephone Encounter (Signed)
Overnight oximetry did reveal desaturations  Start on 2 L of oxygen on repeat oximetry in 1 to 2 weeks to assess for adequacy supplementation

## 2017-12-18 NOTE — Telephone Encounter (Signed)
Clarified the order with AO he would like the patient to be on 4l and repeat ono. Patient is aware and nothing further is needed at this time.

## 2017-12-19 ENCOUNTER — Telehealth: Payer: Self-pay | Admitting: Pulmonary Disease

## 2017-12-19 NOTE — Telephone Encounter (Signed)
Called and spoke with Patient. She stated that she is wanting to know what O2 is on RA while moving around.  Her next appointment is 01/15/18, with Dr. Ander Slade.  She is aware of her ONO on 4L being scheduled. She was open to being checked while ambulating on her next OV.    Will route message to Dr. Ander Slade and Nira Conn to follow up at next OV

## 2017-12-25 ENCOUNTER — Ambulatory Visit: Payer: Self-pay

## 2017-12-25 NOTE — Telephone Encounter (Signed)
We can bring in just for the walk if she is still concerned about it  Saturations have been fine during recent visit spot checks  Does not need to be seen by a physician sooner than when she is scheduled for as there is nothing to change

## 2017-12-25 NOTE — Telephone Encounter (Signed)
Dr. Ander Slade, please advise on this for pt. Do we need to get pt to come in for a sooner appt to have her O2 checked on RA to see what that is or is it okay for her to wait until her current scheduled appt 01/15/18 with you. Thanks!

## 2017-12-25 NOTE — Telephone Encounter (Signed)
Spoke with pt, she is scheduled for a qualifying walk today at 4 pm since there is no availability until next Thursday due to the move. Nothing further is needed.

## 2018-01-01 ENCOUNTER — Ambulatory Visit (INDEPENDENT_AMBULATORY_CARE_PROVIDER_SITE_OTHER): Payer: Medicare Other | Admitting: *Deleted

## 2018-01-01 ENCOUNTER — Ambulatory Visit: Payer: Self-pay

## 2018-01-01 DIAGNOSIS — R0609 Other forms of dyspnea: Secondary | ICD-10-CM | POA: Diagnosis not present

## 2018-01-01 NOTE — Progress Notes (Signed)
Pt in office for qualifying walk.   

## 2018-01-14 ENCOUNTER — Ambulatory Visit: Payer: Medicare Other | Admitting: Family Medicine

## 2018-01-15 ENCOUNTER — Ambulatory Visit (INDEPENDENT_AMBULATORY_CARE_PROVIDER_SITE_OTHER): Payer: Medicare Other | Admitting: Pulmonary Disease

## 2018-01-15 ENCOUNTER — Encounter: Payer: Self-pay | Admitting: Pulmonary Disease

## 2018-01-15 VITALS — BP 104/72 | HR 80 | Ht 67.0 in | Wt 233.0 lb

## 2018-01-15 DIAGNOSIS — R0602 Shortness of breath: Secondary | ICD-10-CM

## 2018-01-15 DIAGNOSIS — R0683 Snoring: Secondary | ICD-10-CM

## 2018-01-15 NOTE — Progress Notes (Signed)
   Subjective:    Patient ID: Shelby [name], female    DOB: 1975-10-30, 42 y.o.   MRN: [id number]  CC: Abnormal polysomnogram showing nocturnal hypoxemia Very mild obstructive sleep apnea-below treatment threshold Nocturnal desaturation  HPI: History of snoring, history of daytime sleepiness-improved Oxygen supplementation Has underlying history of asthma-uses albuterol No other diagnosed pulmonary disease No history of lung disease/heart disease  No significant shortness of breath with moderate exertion  She is an active smoker-trying to quit  History of GERD, history of traumatic brain injury History of chronic back pain-she did gain significant amount of weight with exacerbation of chronic back pain  Was not on oxygen supplementation at night since last visit and has been doing okay with it  Review of Systems  Constitutional: Negative for fever and unexpected weight change.  HENT: Positive for dental problem. Negative for congestion, ear pain, nosebleeds, postnasal drip, rhinorrhea, sinus pressure, sneezing, sore throat and trouble swallowing.   Eyes: Negative for redness and itching.  Respiratory: Positive for choking and wheezing. Negative for cough, chest tightness and shortness of breath.   Cardiovascular: Negative for leg swelling.  Musculoskeletal: Negative for joint swelling.  Allergic/Immunologic: Positive for environmental allergies. Negative for food allergies and immunocompromised state.  Hematological: Does not bruise/bleed easily.  Psychiatric/Behavioral: Positive for dysphoric mood. The patient is nervous/anxious.    Today's Vitals   01/15/18 1008  BP: 104/72  Pulse: 80  SpO2: 100%  Weight: 233 lb (105.7 kg)  Height: 5\' 7"  (1.702 m)   Body mass index is 36.49 kg/m.     Objective:   Physical Exam  Constitutional: She is oriented to person, place, and time. She appears well-developed and well-nourished. No distress.  HENT:  Head: Normocephalic and  atraumatic.  Mouth/Throat: No oropharyngeal exudate.  Eyes: Pupils are equal, round, and reactive to light. EOM are normal. Right eye exhibits no discharge. Left eye exhibits no discharge. No scleral icterus.  Neck: Normal range of motion. Neck supple. No tracheal deviation present. No thyromegaly present.  Cardiovascular: Normal rate, regular rhythm and normal heart sounds.  No murmur heard. Pulmonary/Chest: Effort normal and breath sounds normal. No stridor. No respiratory distress. She has no wheezes. She has no rales.  Abdominal: Soft. Bowel sounds are normal. She exhibits no distension. There is no tenderness.  Musculoskeletal: She exhibits no edema.  Neurological: She is alert and oriented to person, place, and time.  Skin: Skin is warm and dry. She is not diaphoretic.  Psychiatric: She has a normal mood and affect.     Study results: Echocardiogram was within normal limits Assessment & Plan:  Shortness of breath -We will encourage regular exercises, diet  Nocturnal desaturations -Continue oxygen supplementation, currently on 4 L of oxygen  History of asthma -PRN bronchodilator use  Depression -Continue current medications  Snoring -Elevation of the head of the bed will help  Obesity -Exercise and weight loss efforts  Plan: I will see you  back in the office in about 6 months  Continue smoking cessation efforts  Continue bronchodilator use  Continue efforts at weight loss and regular exercises  Continue oxygen use at night

## 2018-01-15 NOTE — Patient Instructions (Signed)
Shortness of breath  Snoring   Continue to work on trying to quit smoking Encourage regular exercise for weight loss  Elevation of the head of the bed by about 30 degrees usually does help snoring  Continue with oxygen use at night  Back in the office in about 6 months Call with any significant concerns

## 2018-01-19 ENCOUNTER — Ambulatory Visit: Payer: Medicare Other | Admitting: Family Medicine

## 2018-02-06 ENCOUNTER — Ambulatory Visit (INDEPENDENT_AMBULATORY_CARE_PROVIDER_SITE_OTHER): Payer: Medicare Other

## 2018-02-06 ENCOUNTER — Ambulatory Visit (INDEPENDENT_AMBULATORY_CARE_PROVIDER_SITE_OTHER): Payer: Medicare Other | Admitting: Family Medicine

## 2018-02-06 ENCOUNTER — Encounter: Payer: Self-pay | Admitting: Family Medicine

## 2018-02-06 VITALS — BP 124/76 | HR 71 | Temp 97.8°F | Ht 67.0 in | Wt 256.0 lb

## 2018-02-06 DIAGNOSIS — S069X5D Unspecified intracranial injury with loss of consciousness greater than 24 hours with return to pre-existing conscious level, subsequent encounter: Secondary | ICD-10-CM

## 2018-02-06 DIAGNOSIS — M4722 Other spondylosis with radiculopathy, cervical region: Secondary | ICD-10-CM

## 2018-02-06 DIAGNOSIS — F419 Anxiety disorder, unspecified: Secondary | ICD-10-CM | POA: Diagnosis not present

## 2018-02-06 DIAGNOSIS — M4712 Other spondylosis with myelopathy, cervical region: Secondary | ICD-10-CM

## 2018-02-06 DIAGNOSIS — E66813 Obesity, class 3: Secondary | ICD-10-CM

## 2018-02-06 DIAGNOSIS — G4734 Idiopathic sleep related nonobstructive alveolar hypoventilation: Secondary | ICD-10-CM | POA: Diagnosis not present

## 2018-02-06 DIAGNOSIS — Z6841 Body Mass Index (BMI) 40.0 and over, adult: Secondary | ICD-10-CM | POA: Diagnosis not present

## 2018-02-06 DIAGNOSIS — M542 Cervicalgia: Secondary | ICD-10-CM | POA: Diagnosis not present

## 2018-02-06 MED ORDER — ALBUTEROL SULFATE HFA 108 (90 BASE) MCG/ACT IN AERS: 2.0000 | INHALATION_SPRAY | Freq: Four times a day (QID) | RESPIRATORY_TRACT | 6 refills | Status: DC | PRN

## 2018-02-06 MED ORDER — DULOXETINE HCL 30 MG PO CPEP: ORAL_CAPSULE | ORAL | 1 refills | Status: DC

## 2018-02-06 MED ORDER — SERTRALINE HCL 100 MG PO TABS: 100.0000 mg | ORAL_TABLET | Freq: Every day | ORAL | 1 refills | Status: DC

## 2018-02-06 MED ORDER — FLUTICASONE-UMECLIDIN-VILANT 100-62.5-25 MCG/INH IN AEPB: 1.0000 | INHALATION_SPRAY | Freq: Every day | RESPIRATORY_TRACT | 5 refills | Status: DC

## 2018-02-06 MED ORDER — PREDNISONE 10 MG PO TABS: ORAL_TABLET | ORAL | 0 refills | Status: DC

## 2018-02-06 MED ORDER — HYDROXYZINE HCL 25 MG PO TABS: ORAL_TABLET | ORAL | 2 refills | Status: DC

## 2018-02-06 MED ORDER — ARIPIPRAZOLE 15 MG PO TABS: 15.0000 mg | ORAL_TABLET | Freq: Every day | ORAL | 1 refills | Status: DC

## 2018-02-06 NOTE — Addendum Note (Signed)
Addended by: Claretta Fraise on: 02/06/2018 02:09 PM   Modules accepted: Orders

## 2018-02-06 NOTE — Progress Notes (Addendum)
Subjective:  Patient ID: Shelby [name], female    DOB: 1975/08/15  Age: 42 y.o. MRN: [id number]  CC: Asthma (6 month follow up of chronic medical conditons)   HPI Shelby [name] presents for follow-up per traumatic brain injury.  She is having more agitation as well.  She would like to increase the Xanax since it is not helping anymore.  She adamantly refuses to see psychiatry.  Additionally she is having increasing neck pain.  This is been ongoing for about a month.  It is associated with some swelling at the posterior base.  She describes the pain as a throbbing that is increasing and not relieved by ibuprofen.  There is some radiation into her right not arm with occasional numbness.  Additionally her lower back pain although improved by surgery is still present.  Pain is in the 5-7 range at the neck.  4-5 range at the lower back.  Denies any shortness of breath recently.  Seen by pulmonology on December 5.  This confirmed nocturnal hypoxia.  She is to continue using her oxygen as well as using medications as prescribed.  Depression screen [facility] 2/9 02/06/2018 10/15/2017 08/29/2017  Decreased Interest 0 0 0  Down, Depressed, Hopeless 0 0 0  PHQ - 2 Score 0 0 0  Altered sleeping - - -  Tired, decreased energy - - -  Change in appetite - - -  Feeling bad or failure about yourself  - - -  Trouble concentrating - - -  Moving slowly or fidgety/restless - - -  Suicidal thoughts - - -  PHQ-9 Score - - -  Difficult doing work/chores - - -  Some recent data might be hidden    History Shelby [name] has a past medical history of Anxiety, Arthritis, Asthma, Bad memory, Bipolar disorder ([place]), Chronic kidney disease, Depression, Endometriosis, Migraines, and Traumatic brain injury ([place]) (2001).   She has a past surgical history that includes Cesarean section; cyst remove; uterine ablation; Tubal ligation; Umbilical hernia repair (09/20/2011); Incisional hernia repair (12/27/2011); Wound debridement  (12/27/2011); Insertion of mesh (12/27/2011); Hand surgery; Abdominal hysterectomy (06/2017); and Colonoscopy with propofol (N/A, 09/22/2017).   Her family history includes Alzheimer's disease in her maternal grandmother and paternal grandmother; Colon cancer in her paternal grandfather; Diabetes in her father; Healthy in her daughter and daughter.She reports that she has been smoking cigarettes. She has a 37.50 pack-year smoking history. She has never used smokeless tobacco. She reports that she does not drink alcohol or use drugs.  Current Outpatient Medications on File Prior to Visit  Medication Sig Dispense Refill  . acetaminophen (TYLENOL) 650 MG CR tablet Take 1,300 mg by mouth 2 (two) times daily as needed for pain.    Marland Kitchen albuterol (VENTOLIN HFA) 108 (90 Base) MCG/ACT inhaler INHALE 2 PUFFS BY MOUTH EVERY 6 HOURS AS NEEDED FOR WHEEZING OR SHORTNESS OF BREATH 18 g 5  . ALPRAZolam (XANAX) 0.5 MG tablet Take 1 and 1/2 tablets QHS 45 tablet 2  . cyclobenzaprine (FLEXERIL) 10 MG tablet Take 1 tablet (10 mg total) by mouth 3 (three) times daily as needed for muscle spasms. 60 tablet 1  . gabapentin (NEURONTIN) 300 MG capsule gabapentin 300 mg capsule  TK 1 C PO TID PRN 90 capsule 2  . ibuprofen (ADVIL,MOTRIN) 200 MG tablet Take 400 mg by mouth every 8 (eight) hours as needed (for inflammation.).     No current facility-administered medications on file prior to visit.  ROS Review of Systems  Constitutional: Negative.   HENT: Negative.   Eyes: Negative for visual disturbance.  Respiratory: Negative for shortness of breath.   Cardiovascular: Negative for chest pain.  Gastrointestinal: Negative for abdominal pain.  Musculoskeletal: Positive for back pain, joint swelling and neck pain. Negative for gait problem.  Psychiatric/Behavioral: Positive for agitation and dysphoric mood. The patient is nervous/anxious.     Objective:  BP 124/76   Pulse 71   Temp 97.8 F (36.6 C) (Oral)   Ht  _0  (1.702 m)   Wt 256 lb (116.1 kg)   LMP 05/12/2016   BMI 40.10 kg/m   BP Readings from Last 3 Encounters:  02/06/18 124/76  01/15/18 104/72  11/18/17 118/76    Wt Readings from Last 3 Encounters:  02/06/18 256 lb (116.1 kg)  01/15/18 233 lb (105.7 kg)  11/18/17 254 lb (115.2 kg)     Physical Exam Constitutional:      General: She is not in acute distress.    Appearance: She is well-developed.  HENT:     Head: Normocephalic and atraumatic.     Right Ear: External ear normal.     Left Ear: External ear normal.     Nose: Nose normal.  Eyes:     Conjunctiva/sclera: Conjunctivae normal.     Pupils: Pupils are equal, round, and reactive to light.  Neck:     Musculoskeletal: Normal range of motion and neck supple.     Thyroid: No thyromegaly.  Cardiovascular:     Rate and Rhythm: Normal rate and regular rhythm.     Heart sounds: Normal heart sounds. No murmur.  Pulmonary:     Effort: Pulmonary effort is normal. No respiratory distress.     Breath sounds: Normal breath sounds. No wheezing or rales.  Abdominal:     General: Bowel sounds are normal. There is no distension.     Palpations: Abdomen is soft.     Tenderness: There is no abdominal tenderness.  Lymphadenopathy:     Cervical: No cervical adenopathy.  Skin:    General: Skin is warm and dry.  Neurological:     Mental Status: She is alert and oriented to person, place, and time.     Deep Tendon Reflexes: Reflexes are normal and symmetric.  Psychiatric:        Behavior: Behavior normal.        Thought Content: Thought content normal.        Judgment: Judgment normal.       Assessment & Plan:   Shelby [name] was seen today for asthma.  Diagnoses and all orders for this visit:  Cervical spondylosis with myelopathy and radiculopathy  Neck pain -     CBC with Differential/Platelet -     CMP14+EGFR -     Lipid panel -     DG Cervical Spine Complete; Future  Class 3 severe obesity due to excess calories  without serious comorbidity with body mass index (BMI) of 40.0 to 44.9 in adult (HCC) -     CBC with Differential/Platelet -     CMP14+EGFR -     Lipid panel  Anxiety  Nocturnal hypoxia  Traumatic brain injury, with loss of consciousness greater than 24 hours with return to pre-existing conscious level, subsequent encounter  Other orders -     albuterol (PROVENTIL HFA;VENTOLIN HFA) 108 (90 Base) MCG/ACT inhaler; Inhale 2 puffs into the lungs every 6 (six) hours as needed for wheezing or shortness of breath. -  ARIPiprazole (ABILIFY) 15 MG tablet; Take 1 tablet (15 mg total) by mouth at bedtime. -     DULoxetine (CYMBALTA) 30 MG capsule; TK 1 C PO QD -     Fluticasone-Umeclidin-Vilant (TRELEGY ELLIPTA) 100-62.5-25 MCG/INH AEPB; Inhale 1 puff into the lungs daily. -     hydrOXYzine (ATARAX/VISTARIL) 25 MG tablet; TAKE 1 TABLET(25 MG) BY MOUTH TID DAILY AS NEEDED FOR ANXIETY -     sertraline (ZOLOFT) 100 MG tablet; Take 1 tablet (100 mg total) by mouth daily. For the first week take only 1/2 tablet daily. Then increase to a full dose. -     predniSONE (DELTASONE) 10 MG tablet; Take 5 daily for 3 days followed by 4,3,2 and 1 for 3 days each.       I have changed Shelby [name]'s ARIPiprazole. I am also having her start on predniSONE. Additionally, I am having her maintain her acetaminophen, cyclobenzaprine, ibuprofen, albuterol, ALPRAZolam, gabapentin, albuterol, DULoxetine, Fluticasone-Umeclidin-Vilant, hydrOXYzine, and sertraline.  Allergies as of 02/06/2018      Reactions   Chantix [varenicline] Other (See Comments)   Nightmares   Penicillins Other (See Comments)   Unknown Has patient had a PCN reaction causing immediate rash, facial/tongue/throat swelling, SOB or lightheadedness with hypotension: Unknown Has patient had a PCN reaction causing severe rash involving mucus membranes or skin necrosis: Unknown Has patient had a PCN reaction that required hospitalization:  Unknown Has patient had a PCN reaction occurring within the last 10 years: No If all of the above answers are "NO", then may proceed with Cephalosporin use.   Latex Rash      Medication List       Accurate as of February 06, 2018  2:09 PM. Always use your most recent med list.        acetaminophen 650 MG CR tablet Commonly known as:  TYLENOL Take 1,300 mg by mouth 2 (two) times daily as needed for pain.   albuterol 108 (90 Base) MCG/ACT inhaler Commonly known as:  VENTOLIN HFA INHALE 2 PUFFS BY MOUTH EVERY 6 HOURS AS NEEDED FOR WHEEZING OR SHORTNESS OF BREATH   albuterol 108 (90 Base) MCG/ACT inhaler Commonly known as:  PROVENTIL HFA;VENTOLIN HFA Inhale 2 puffs into the lungs every 6 (six) hours as needed for wheezing or shortness of breath.   ALPRAZolam 0.5 MG tablet Commonly known as:  XANAX Take 1 and 1/2 tablets QHS   ARIPiprazole 15 MG tablet Commonly known as:  ABILIFY Take 1 tablet (15 mg total) by mouth at bedtime.   cyclobenzaprine 10 MG tablet Commonly known as:  FLEXERIL Take 1 tablet (10 mg total) by mouth 3 (three) times daily as needed for muscle spasms.   DULoxetine 30 MG capsule Commonly known as:  CYMBALTA TK 1 C PO QD   Fluticasone-Umeclidin-Vilant 100-62.5-25 MCG/INH Aepb Commonly known as:  TRELEGY ELLIPTA Inhale 1 puff into the lungs daily.   gabapentin 300 MG capsule Commonly known as:  NEURONTIN gabapentin 300 mg capsule  TK 1 C PO TID PRN   hydrOXYzine 25 MG tablet Commonly known as:  ATARAX/VISTARIL TAKE 1 TABLET(25 MG) BY MOUTH TID DAILY AS NEEDED FOR ANXIETY   ibuprofen 200 MG tablet Commonly known as:  ADVIL,MOTRIN Take 400 mg by mouth every 8 (eight) hours as needed (for inflammation.).   predniSONE 10 MG tablet Commonly known as:  DELTASONE Take 5 daily for 3 days followed by 4,3,2 and 1 for 3 days each.   sertraline 100 MG tablet Commonly known  as:  ZOLOFT Take 1 tablet (100 mg total) by mouth daily. For the first week  take only 1/2 tablet daily. Then increase to a full dose.        Follow-up: Return in about 6 months (around 08/08/2018).  Claretta Fraise, M.D.

## 2018-02-07 LAB — CBC WITH DIFFERENTIAL/PLATELET
BASOS ABS: 0.1 10*3/uL (ref 0.0–0.2)
Basos: 1 %
EOS (ABSOLUTE): 0.5 10*3/uL — ABNORMAL HIGH (ref 0.0–0.4)
Eos: 5 %
HEMATOCRIT: 40.6 % (ref 34.0–46.6)
HEMOGLOBIN: 14.1 g/dL (ref 11.1–15.9)
Immature Grans (Abs): 0 10*3/uL (ref 0.0–0.1)
Immature Granulocytes: 0 %
LYMPHS ABS: 2.5 10*3/uL (ref 0.7–3.1)
Lymphs: 23 %
MCH: 28.8 pg (ref 26.6–33.0)
MCHC: 34.7 g/dL (ref 31.5–35.7)
MCV: 83 fL (ref 79–97)
MONOCYTES: 5 %
MONOS ABS: 0.5 10*3/uL (ref 0.1–0.9)
Neutrophils Absolute: 6.9 10*3/uL (ref 1.4–7.0)
Neutrophils: 66 %
Platelets: 376 10*3/uL (ref 150–450)
RBC: 4.9 x10E6/uL (ref 3.77–5.28)
RDW: 12.8 % (ref 12.3–15.4)
WBC: 10.5 10*3/uL (ref 3.4–10.8)

## 2018-02-07 LAB — LIPID PANEL
CHOLESTEROL TOTAL: 187 mg/dL (ref 100–199)
Chol/HDL Ratio: 5.5 ratio — ABNORMAL HIGH (ref 0.0–4.4)
HDL: 34 mg/dL — ABNORMAL LOW (ref >39)
LDL Calculated: 124 mg/dL — ABNORMAL HIGH (ref 0–99)
Triglycerides: 147 mg/dL (ref 0–149)
VLDL Cholesterol Cal: 29 mg/dL (ref 5–40)

## 2018-02-07 LAB — CMP14+EGFR
ALBUMIN: 4.2 g/dL (ref 3.5–5.5)
ALK PHOS: 85 IU/L (ref 39–117)
ALT: 11 IU/L (ref 0–32)
AST: 10 IU/L (ref 0–40)
Albumin/Globulin Ratio: 1.8 (ref 1.2–2.2)
BUN / CREAT RATIO: 6 — AB (ref 9–23)
BUN: 4 mg/dL — ABNORMAL LOW (ref 6–24)
Bilirubin Total: 0.3 mg/dL (ref 0.0–1.2)
CHLORIDE: 106 mmol/L (ref 96–106)
CO2: 21 mmol/L (ref 20–29)
Calcium: 9 mg/dL (ref 8.7–10.2)
Creatinine, Ser: 0.72 mg/dL (ref 0.57–1.00)
GFR calc Af Amer: 120 mL/min/{1.73_m2} (ref >59)
GFR calc non Af Amer: 104 mL/min/{1.73_m2} (ref >59)
GLOBULIN, TOTAL: 2.4 g/dL (ref 1.5–4.5)
GLUCOSE: 78 mg/dL (ref 65–99)
Potassium: 4.2 mmol/L (ref 3.5–5.2)
SODIUM: 140 mmol/L (ref 134–144)
Total Protein: 6.6 g/dL (ref 6.0–8.5)

## 2018-02-16 ENCOUNTER — Encounter (HOSPITAL_COMMUNITY): Payer: Self-pay | Admitting: Physical Therapy

## 2018-02-16 NOTE — Therapy (Signed)
[place], [place], [address] Phone: [phone]   Fax:  [phone]  Patient Details  Name: Shelby [name] MRN: [id number] Date of Birth: 1975/12/18 Referring Provider:  Levy Pupa   Encounter Date: 02/16/2018   PHYSICAL THERAPY DISCHARGE SUMMARY  Visits from Start of Care: 2  Current functional level related to goals / functional outcomes: unknown   Remaining deficits: unknown   Education / Equipment: HEP Plan: Patient agrees to discharge.  Patient goals were not met. Patient is being discharged due to not returning since the last visit.  ?????     Pt came for eval; did not return for three weeks and then never came back.  Discharge for noncompliance    Rayetta Humphrey, [place] CLT [phone] 02/16/2018, 8:51 AM  [place] [address] [place], [place], [address] Phone: [phone]   Fax:  [phone]

## 2018-02-18 ENCOUNTER — Ambulatory Visit: Payer: Medicare Other | Admitting: Family Medicine

## 2018-02-19 ENCOUNTER — Telehealth: Payer: Self-pay | Admitting: Family Medicine

## 2018-02-19 NOTE — Telephone Encounter (Signed)
FYI

## 2018-02-20 ENCOUNTER — Ambulatory Visit (INDEPENDENT_AMBULATORY_CARE_PROVIDER_SITE_OTHER): Payer: Medicare Other | Admitting: Gastroenterology

## 2018-02-20 ENCOUNTER — Other Ambulatory Visit: Payer: Self-pay

## 2018-02-20 ENCOUNTER — Telehealth: Payer: Self-pay

## 2018-02-20 ENCOUNTER — Encounter: Payer: Self-pay | Admitting: Gastroenterology

## 2018-02-20 VITALS — BP 136/94 | HR 90 | Temp 97.0°F | Ht 68.0 in | Wt 255.0 lb

## 2018-02-20 DIAGNOSIS — R1310 Dysphagia, unspecified: Secondary | ICD-10-CM

## 2018-02-20 DIAGNOSIS — K219 Gastro-esophageal reflux disease without esophagitis: Secondary | ICD-10-CM

## 2018-02-20 DIAGNOSIS — K5900 Constipation, unspecified: Secondary | ICD-10-CM

## 2018-02-20 MED ORDER — LUBIPROSTONE 24 MCG PO CAPS: 24.0000 ug | ORAL_CAPSULE | Freq: Two times a day (BID) | ORAL | 3 refills | Status: DC

## 2018-02-20 MED ORDER — PANTOPRAZOLE SODIUM 40 MG PO TBEC: 40.0000 mg | DELAYED_RELEASE_TABLET | Freq: Every day | ORAL | 3 refills | Status: DC
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
## 2018-02-20 NOTE — Patient Instructions (Signed)
We have scheduled an upper endoscopy with dilation by Dr. Gala Romney in the near future.   I have sent in pantoprazole (Protonix) to take 30 minutes before breakfast for reflux. Take this every day.   I have sent in Amitiza and provided samples. Take one gelcap twice a day with food (to avoid nausea). We can always titrate the dose if needed.  We will see you in about 3 months!  I enjoyed seeing you again today! As you know, I value our relationship and want to provide genuine, compassionate, and quality care. I welcome your feedback. If you receive a survey regarding your visit,  I greatly appreciate you taking time to fill this out. See you next time!  Annitta Needs, PhD, ANP-BC [facility]
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
## 2018-02-20 NOTE — Telephone Encounter (Signed)
Called and informed pt of pre-op appt 03/05/18 at 9:00am. Letter mailed.

## 2018-02-23 ENCOUNTER — Encounter (HOSPITAL_COMMUNITY): Payer: Self-pay | Admitting: Occupational Therapy

## 2018-02-25 NOTE — Assessment & Plan Note (Signed)
Resume Amitiza 24 mcg BID. Linzess previously with diarrhea.

## 2018-02-25 NOTE — Assessment & Plan Note (Signed)
Take Protonix daily as previously recommended. Prescription provided.

## 2018-02-25 NOTE — Assessment & Plan Note (Signed)
43 year old female with history of chronic GERD, not taking PPI daily, and with solid food dysphagia. Likely dysphagia multifactorial in setting of poor fitting dentures, uncontrolled GERD, lack of daily PPI.   Discussed again taking Protonix once daily. Proceed with upper endoscopy/dilation in the near future with Dr. Gala Romney. The risks, benefits, and alternatives have been discussed in detail with patient. They have stated understanding and desire to proceed.  Propofol due to polypharmacy Return in 3 months

## 2018-02-26 NOTE — Progress Notes (Signed)
CC'D TO PCP °

## 2018-03-04 ENCOUNTER — Encounter (HOSPITAL_COMMUNITY): Payer: Self-pay

## 2018-03-05 ENCOUNTER — Ambulatory Visit (HOSPITAL_COMMUNITY)
Admission: RE | Admit: 2018-03-05 | Discharge: 2018-03-05 | Disposition: A | Discharge status: Home / Self Care | Payer: Medicare Other | Source: Ambulatory Visit | Attending: Internal Medicine | Admitting: Internal Medicine

## 2018-03-09 ENCOUNTER — Encounter: Payer: Self-pay | Admitting: Family Medicine

## 2018-03-12 ENCOUNTER — Encounter (HOSPITAL_COMMUNITY)
Admission: RE | Disposition: A | Discharge status: Home / Self Care | Payer: Self-pay | Source: Home / Self Care | Attending: Internal Medicine

## 2018-03-12 ENCOUNTER — Ambulatory Visit (HOSPITAL_COMMUNITY): Payer: Medicare Other | Admitting: Anesthesiology

## 2018-03-12 ENCOUNTER — Ambulatory Visit (HOSPITAL_COMMUNITY)
Admission: RE | Admit: 2018-03-12 | Discharge: 2018-03-12 | Disposition: A | Discharge status: Home / Self Care | Payer: Medicare Other | Attending: Internal Medicine | Admitting: Internal Medicine

## 2018-03-12 ENCOUNTER — Encounter (HOSPITAL_COMMUNITY): Payer: Self-pay | Admitting: *Deleted

## 2018-03-12 DIAGNOSIS — Z79899 Other long term (current) drug therapy: Secondary | ICD-10-CM | POA: Diagnosis not present

## 2018-03-12 DIAGNOSIS — R12 Heartburn: Secondary | ICD-10-CM | POA: Insufficient documentation

## 2018-03-12 DIAGNOSIS — R1310 Dysphagia, unspecified: Secondary | ICD-10-CM | POA: Insufficient documentation

## 2018-03-12 DIAGNOSIS — K209 Esophagitis, unspecified: Secondary | ICD-10-CM

## 2018-03-12 DIAGNOSIS — F319 Bipolar disorder, unspecified: Secondary | ICD-10-CM | POA: Insufficient documentation

## 2018-03-12 DIAGNOSIS — Z87820 Personal history of traumatic brain injury: Secondary | ICD-10-CM | POA: Diagnosis not present

## 2018-03-12 DIAGNOSIS — J45909 Unspecified asthma, uncomplicated: Secondary | ICD-10-CM | POA: Diagnosis not present

## 2018-03-12 DIAGNOSIS — K210 Gastro-esophageal reflux disease with esophagitis: Secondary | ICD-10-CM | POA: Insufficient documentation

## 2018-03-12 DIAGNOSIS — Z7951 Long term (current) use of inhaled steroids: Secondary | ICD-10-CM | POA: Insufficient documentation

## 2018-03-12 DIAGNOSIS — N189 Chronic kidney disease, unspecified: Secondary | ICD-10-CM | POA: Diagnosis not present

## 2018-03-12 DIAGNOSIS — F419 Anxiety disorder, unspecified: Secondary | ICD-10-CM | POA: Diagnosis not present

## 2018-03-12 DIAGNOSIS — K219 Gastro-esophageal reflux disease without esophagitis: Secondary | ICD-10-CM | POA: Diagnosis not present

## 2018-03-12 DIAGNOSIS — F17210 Nicotine dependence, cigarettes, uncomplicated: Secondary | ICD-10-CM | POA: Diagnosis not present

## 2018-03-12 HISTORY — PX: ESOPHAGOGASTRODUODENOSCOPY (EGD) WITH PROPOFOL: SHX5813

## 2018-03-12 HISTORY — PX: MALONEY DILATION: SHX5535

## 2018-03-12 SURGERY — ESOPHAGOGASTRODUODENOSCOPY (EGD) WITH PROPOFOL
Anesthesia: Monitor Anesthesia Care

## 2018-03-12 MED ORDER — CHLORHEXIDINE GLUCONATE CLOTH 2 % EX PADS: 6.0000 | MEDICATED_PAD | Freq: Once | CUTANEOUS | Status: DC

## 2018-03-12 MED ORDER — HYDROMORPHONE HCL 1 MG/ML IJ SOLN: 0.2500 mg | INTRAMUSCULAR | Status: DC | PRN

## 2018-03-12 MED ORDER — PROMETHAZINE HCL 25 MG/ML IJ SOLN: 6.2500 mg | INTRAMUSCULAR | Status: DC | PRN

## 2018-03-12 MED ORDER — PROPOFOL 10 MG/ML IV BOLUS
INTRAVENOUS | Status: DC | PRN
  Administered 2018-03-12: 60 mg via INTRAVENOUS
  Administered 2018-03-12: 20 mg via INTRAVENOUS

## 2018-03-12 MED ORDER — PROPOFOL 500 MG/50ML IV EMUL
INTRAVENOUS | Status: DC | PRN
  Administered 2018-03-12: 200 ug/kg/min via INTRAVENOUS

## 2018-03-12 MED ORDER — LACTATED RINGERS IV SOLN
INTRAVENOUS | Status: DC
  Administered 2018-03-12: 1000 mL via INTRAVENOUS

## 2018-03-12 MED ORDER — HYDROCODONE-ACETAMINOPHEN 7.5-325 MG PO TABS: 1.0000 | ORAL_TABLET | Freq: Once | ORAL | Status: DC | PRN

## 2018-03-12 MED ORDER — PROPOFOL 10 MG/ML IV BOLUS
INTRAVENOUS | Status: AC
  Filled 2018-03-12: qty 80

## 2018-03-12 MED ORDER — MEPERIDINE HCL 100 MG/ML IJ SOLN: 6.2500 mg | INTRAMUSCULAR | Status: DC | PRN

## 2018-03-12 MED ORDER — LACTATED RINGERS IV SOLN: INTRAVENOUS | Status: DC

## 2018-03-12 NOTE — Interval H&P Note (Signed)
History and Physical Interval Note:  03/12/2018 7:29 AM  [name]  has presented today for surgery, with the diagnosis of dysphagia  The various methods of treatment have been discussed with the patient and family. After consideration of risks, benefits and other options for treatment, the patient has consented to  Procedure(s) with comments: ESOPHAGOGASTRODUODENOSCOPY (EGD) WITH PROPOFOL (N/A) - 7:30am MALONEY DILATION (N/A) as a surgical intervention .  The patient's history has been reviewed, patient examined, no change in status, stable for surgery.  I have reviewed the patient's chart and labs.  Questions were answered to the patient's satisfaction.     Mykeria Garman  Chronic GERD.  Intermittent esophageal dysphagia.  No change.  EGD with ED today per plan.  The risks, benefits, limitations, alternatives and imponderables have been reviewed with the patient. Potential for esophageal dilation, biopsy, etc. have also been reviewed.  Questions have been answered. All parties agreeable.

## 2018-03-12 NOTE — Op Note (Signed)
[facility] Patient Name: Shelby [name] Procedure Date: 03/12/2018 7:10 AM MRN: [id number] Date of Birth: [birth year] Attending MD: Norvel Richards , MD CSN: [id number] Age: 43 Admit Type: Outpatient Procedure:                Upper GI endoscopy Indications:              Dysphagia, Heartburn Providers:                Norvel Richards, MD, Jeanann Lewandowsky. Sharon Seller, RN,                            Gerome Sam, RN, Aram Candela Referring MD:             Edwinna Areola. Hall MD Medicines:                Propofol per Anesthesia Complications:            No immediate complications. Estimated Blood Loss:     Estimated blood loss: none. Procedure:                Pre-Anesthesia Assessment:                           - Prior to the procedure, a History and Physical                            was performed, and patient medications and                            allergies were reviewed. The patient's tolerance of                            previous anesthesia was also reviewed. The risks                            and benefits of the procedure and the sedation                            options and risks were discussed with the patient.                            All questions were answered, and informed consent                            was obtained. Prior Anticoagulants: The patient has                            taken no previous anticoagulant or antiplatelet                            agents. ASA Grade Assessment: II - A patient with                            mild systemic disease. After reviewing the risks  and benefits, the patient was deemed in                            satisfactory condition to undergo the procedure.                           After obtaining informed consent, the endoscope was                            passed under direct vision. Throughout the                            procedure, the patient's blood pressure, pulse, and            oxygen saturations were monitored continuously. The                            GIF-H190 ([id number]) scope was introduced through the                            and advanced to the second part of duodenum. The                            upper GI endoscopy was accomplished without                            difficulty. The patient tolerated the procedure                            well. Scope In: 7:38:28 AM Scope Out: 7:44:35 AM Total Procedure Duration: 0 hours 6 minutes 7 seconds  Findings:      Esophagitis was found. Single small ersoion at GEJ; no tumor ; no       barretts epithelium seen; Tubular esophagus appeared patent throughout       its course. .      The entire examined stomach was normal.      The duodenal bulb and second portion of the duodenum were normal.       Dilation was performed with a Maloney dilator with mild resistance at 56       Fr. The scope was withdrawn. The dilation site was examined following       endoscope reinsertion and showed no change. Estimated blood loss: none. Impression:               - Mild erosive reflux esophagitis. Dilated.                           - Normal stomach.                           - Normal duodenal bulb and second portion of the                            duodenum.                           - No specimens  collected. Moderate Sedation:      Moderate (conscious) sedation was personally administered by an       anesthesia professional. The following parameters were monitored: oxygen       saturation, heart rate, blood pressure, respiratory rate, EKG, adequacy       of pulmonary ventilation, and response to care. Recommendation:           - Patient has a contact number available for                            emergencies. The signs and symptoms of potential                            delayed complications were discussed with the                            patient. Return to normal activities tomorrow.                             Written discharge instructions were provided to the                            patient.                           - Advance diet as tolerated.                           - Continue present medications. Increase Protonix                            to 40 mg BID                           - No repeat upper endoscopy.                           - Return to GI office in 3 months. Procedure Code(s):        --- Professional ---                           [phone], Esophagogastroduodenoscopy, flexible,                            transoral; diagnostic, including collection of                            specimen(s) by brushing or washing, when performed                            (separate procedure)                           43450, Dilation of esophagus, by unguided sound or                            bougie, single or multiple  passes Diagnosis Code(s):        --- Professional ---                           K20.9, Esophagitis, unspecified                           R13.10, Dysphagia, unspecified                           R12, Heartburn CPT copyright 2018 American Medical Association. All rights reserved. The codes documented in this report are preliminary and upon coder review may  be revised to meet current compliance requirements. [name]. [name], MD Norvel Richards, MD 03/12/2018 7:56:33 AM This report has been signed electronically. Number of Addenda: 0

## 2018-03-12 NOTE — Transfer of Care (Signed)
Immediate Anesthesia Transfer of Care Note  Patient: Shelby [name]  Procedure(s) Performed: ESOPHAGOGASTRODUODENOSCOPY (EGD) WITH PROPOFOL (N/A ) MALONEY DILATION (N/A )  Patient Location: PACU  Anesthesia Type:MAC  Level of Consciousness: awake and patient cooperative  Airway & Oxygen Therapy: Patient Spontanous Breathing and Patient connected to nasal cannula oxygen  Post-op Assessment: Report given to RN and Post -op Vital signs reviewed and stable  Post vital signs: Reviewed and stable  Last Vitals:  Vitals Value Taken Time  BP    Temp    Pulse 74 03/12/2018  7:51 AM  Resp 15 03/12/2018  7:51 AM  SpO2 96 % 03/12/2018  7:51 AM  Vitals shown include unvalidated device data.  Last Pain:  Vitals:   03/12/18 0733  TempSrc:   PainSc: 0-No pain      Patients Stated Pain Goal: 6 (35/68/61 6837)  Complications: No apparent anesthesia complications

## 2018-03-12 NOTE — Anesthesia Postprocedure Evaluation (Signed)
Anesthesia Post Note  Patient: Shelby [name]  Procedure(s) Performed: ESOPHAGOGASTRODUODENOSCOPY (EGD) WITH PROPOFOL (N/A ) MALONEY DILATION (N/A )  Patient location during evaluation: PACU Anesthesia Type: MAC Level of consciousness: awake and alert and patient cooperative Pain management: satisfactory to patient Vital Signs Assessment: post-procedure vital signs reviewed and stable Respiratory status: spontaneous breathing Cardiovascular status: stable Postop Assessment: no apparent nausea or vomiting Anesthetic complications: no     Last Vitals:  Vitals:   03/12/18 0750 03/12/18 0800  BP: 114/78 108/73  Pulse: 73 70  Resp: 15 20  Temp: 36.7 C   SpO2: 96% 96%    Last Pain:  Vitals:   03/12/18 0800  TempSrc:   PainSc: 0-No pain                 Montague Corella
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
## 2018-03-12 NOTE — Discharge Instructions (Signed)
EGD Discharge instructions Please read the instructions outlined below and refer to this sheet in the next few weeks. These discharge instructions provide you with general information on caring for yourself after you leave the hospital. Your doctor may also give you specific instructions. While your treatment has been planned according to the most current medical practices available, unavoidable complications occasionally occur. If you have any problems or questions after discharge, please call your doctor. ACTIVITY  You may resume your regular activity but move at a slower pace for the next 24 hours.   Take frequent rest periods for the next 24 hours.   Walking will help expel (get rid of) the air and reduce the bloated feeling in your abdomen.   No driving for 24 hours (because of the anesthesia (medicine) used during the test).   You may shower.   Do not sign any important legal documents or operate any machinery for 24 hours (because of the anesthesia used during the test).  NUTRITION  Drink plenty of fluids.   You may resume your normal diet.   Begin with a light meal and progress to your normal diet.   Avoid alcoholic beverages for 24 hours or as instructed by your caregiver.  MEDICATIONS  You may resume your normal medications unless your caregiver tells you otherwise.  WHAT YOU CAN EXPECT TODAY  You may experience abdominal discomfort such as a feeling of fullness or gas pains.  FOLLOW-UP  Your doctor will discuss the results of your test with you.  SEEK IMMEDIATE MEDICAL ATTENTION IF ANY OF THE FOLLOWING OCCUR:  Excessive nausea (feeling sick to your stomach) and/or vomiting.   Severe abdominal pain and distention (swelling).   Trouble swallowing.   Temperature over 101 F (37.8 C).   Rectal bleeding or vomiting of blood.   Food Choices for Gastroesophageal Reflux Disease, Adult When you have gastroesophageal reflux disease (GERD), the foods you eat and your  eating habits are very important. Choosing the right foods can help ease your discomfort. Think about working with a nutrition specialist (dietitian) to help you make good choices. What are tips for following this plan?  Meals Choose healthy foods that are low in fat, such as fruits, vegetables, whole grains, low-fat dairy products, and lean meat, fish, and poultry. Eat small meals often instead of 3 large meals a day. Eat your meals slowly, and in a place where you are relaxed. Avoid bending over or lying down until 2-3 hours after eating. Avoid eating meals 2-3 hours before bed. Avoid drinking a lot of liquid with meals. Cook foods using methods other than frying. Bake, grill, or broil food instead. Avoid or limit: Chocolate. Peppermint or spearmint. Alcohol. Pepper. Black and decaffeinated coffee. Black and decaffeinated tea. Bubbly (carbonated) soft drinks. Caffeinated energy drinks and soft drinks. Limit high-fat foods such as: Fatty meat or fried foods. Whole milk, cream, butter, or ice cream. Nuts and nut butters. Pastries, donuts, and sweets made with butter or shortening. Avoid foods that cause symptoms. These foods may be different for everyone. Common foods that cause symptoms include: Tomatoes. Oranges, lemons, and limes. Peppers. Spicy food. Onions and garlic. Vinegar. Lifestyle Maintain a healthy weight. Ask your doctor what weight is healthy for you. If you need to lose weight, work with your doctor to do so safely. Exercise for at least 30 minutes for 5 or more days each week, or as told by your doctor. Wear loose-fitting clothes. Do not smoke. If you need help quitting,  ask your doctor. Sleep with the head of your bed higher than your feet. Use a wedge under the mattress or blocks under the bed frame to raise the head of the bed. Summary When you have gastroesophageal reflux disease (GERD), food and lifestyle choices are very important in easing your  symptoms. Eat small meals often instead of 3 large meals a day. Eat your meals slowly, and in a place where you are relaxed. Limit high-fat foods such as fatty meat or fried foods. Avoid bending over or lying down until 2-3 hours after eating. Avoid peppermint and spearmint, caffeine, alcohol, and chocolate. This information is not intended to replace advice given to you by your health care provider. Make sure you discuss any questions you have with your health care provider. Document Released: 07/30/2011 Document Revised: 03/05/2016 Document Reviewed: 03/05/2016 Elsevier Interactive Patient Education  2019 [place].      Gastroesophageal Reflux Disease, Adult Gastroesophageal reflux (GER) happens when acid from the stomach flows up into the tube that connects the mouth and the stomach (esophagus). Normally, food travels down the esophagus and stays in the stomach to be digested. With GER, food and stomach acid sometimes move back up into the esophagus. You may have a disease called gastroesophageal reflux disease (GERD) if the reflux:  Happens often.  Causes frequent or very bad symptoms.  Causes problems such as damage to the esophagus. When this happens, the esophagus becomes sore and swollen (inflamed). Over time, GERD can make small holes (ulcers) in the lining of the esophagus. What are the causes? This condition is caused by a problem with the muscle between the esophagus and the stomach. When this muscle is weak or not normal, it does not close properly to keep food and acid from coming back up from the stomach. The muscle can be weak because of:  Tobacco use.  Pregnancy.  Having a certain type of hernia (hiatal hernia).  Alcohol use.  Certain foods and drinks, such as coffee, chocolate, onions, and peppermint. What increases the risk? You are more likely to develop this condition if you:  Are overweight.  Have a disease that affects your connective tissue.  Use  NSAID medicines. What are the signs or symptoms? Symptoms of this condition include:  Heartburn.  Difficult or painful swallowing.  The feeling of having a lump in the throat.  A bitter taste in the mouth.  Bad breath.  Having a lot of saliva.  Having an upset or bloated stomach.  Belching.  Chest pain. Different conditions can cause chest pain. Make sure you see your doctor if you have chest pain.  Shortness of breath or noisy breathing (wheezing).  Ongoing (chronic) cough or a cough at night.  Wearing away of the surface of teeth (tooth enamel).  Weight loss. How is this treated? Treatment will depend on how bad your symptoms are. Your doctor may suggest:  Changes to your diet.  Medicine.  Surgery. Follow these instructions at home: Eating and drinking   Follow a diet as told by your doctor. You may need to avoid foods and drinks such as: ? Coffee and tea (with or without caffeine). ? Drinks that contain alcohol. ? Energy drinks and sports drinks. ? Bubbly (carbonated) drinks or sodas. ? Chocolate and cocoa. ? Peppermint and mint flavorings. ? Garlic and onions. ? Horseradish. ? Spicy and acidic foods. These include peppers, chili powder, curry powder, vinegar, hot sauces, and BBQ sauce. ? Citrus fruit juices and citrus fruits, such  as oranges, lemons, and limes. ? Tomato-based foods. These include red sauce, chili, salsa, and pizza with red sauce. ? Fried and fatty foods. These include donuts, french fries, potato chips, and high-fat dressings. ? High-fat meats. These include hot dogs, rib eye steak, sausage, ham, and bacon. ? High-fat dairy items, such as whole milk, butter, and cream cheese.  Eat small meals often. Avoid eating large meals.  Avoid drinking large amounts of liquid with your meals.  Avoid eating meals during the 2-3 hours before bedtime.  Avoid lying down right after you eat.  Do not exercise right after you eat. Lifestyle   Do  not use any products that contain nicotine or tobacco. These include cigarettes, e-cigarettes, and chewing tobacco. If you need help quitting, ask your doctor.  Try to lower your stress. If you need help doing this, ask your doctor.  If you are overweight, lose an amount of weight that is healthy for you. Ask your doctor about a safe weight loss goal. General instructions  Pay attention to any changes in your symptoms.  Take over-the-counter and prescription medicines only as told by your doctor. Do not take aspirin, ibuprofen, or other NSAIDs unless your doctor says it is okay.  Wear loose clothes. Do not wear anything tight around your waist.  Raise (elevate) the head of your bed about 6 inches (15 cm).  Avoid bending over if this makes your symptoms worse.  Keep all follow-up visits as told by your doctor. This is important. Contact a doctor if:  You have new symptoms.  You lose weight and you do not know why.  You have trouble swallowing or it hurts to swallow.  You have wheezing or a cough that keeps happening.  Your symptoms do not get better with treatment.  You have a hoarse voice. Get help right away if:  You have pain in your arms, neck, jaw, teeth, or back.  You feel sweaty, dizzy, or light-headed.  You have chest pain or shortness of breath.  You throw up (vomit) and your throw-up looks like blood or coffee grounds.  You pass out (faint).  Your poop (stool) is bloody or black.  You cannot swallow, drink, or eat. Summary  If a person has gastroesophageal reflux disease (GERD), food and stomach acid move back up into the esophagus and cause symptoms or problems such as damage to the esophagus.  Treatment will depend on how bad your symptoms are.  Follow a diet as told by your doctor.  Take all medicines only as told by your doctor. This information is not intended to replace advice given to you by your health care provider. Make sure you discuss any  questions you have with your health care provider. Document Released: 07/17/2007 Document Revised: 08/06/2017 Document Reviewed: 08/06/2017 Elsevier Interactive Patient Education  2019 [employer] Inc[employer]  Pantoprazole oral suspension What is this medicine? PANTOPRAZOLE (pan TOE pra zole) prevents the production of acid in the stomach. It is used to treat gastroesophageal reflux disease (GERD), inflammation of the esophagus, and Zollinger-Ellison syndrome. This medicine may be used for other purposes; ask your health care provider or pharmacist if you have questions. COMMON BRAND NAME(S): Protonix What should I tell my health care provider before I take this medicine? They need to know if you have any of these conditions: -liver disease -low levels of magnesium in the blood -lupus -an unusual or allergic reaction to omeprazole, lansoprazole, pantoprazole, rabeprazole, other medicines, foods, dyes, or preservatives -pregnant or trying  to get pregnant -breast-feeding How should I use this medicine? Take this medicine by mouth. Follow the directions on the prescription label. To take this medicine, you can sprinkle the granules on a teaspoonful of applesauce and swallow. Or, you can put the granules in a small cup and mix with a teaspoonful of apple juice. Stir for 5 seconds and drink. Rinse the cup at least once with more apple juice and drink it to be sure you have taken your full dose. Mix your medicine in applesauce or apple juice ONLY. Do not mix with water or any other liquids or foods. Take your dose 30 minutes before a meal. Take your medicine at regular intervals. Do not take your medicine more often than directed. Take all of your medicine as directed even if you think you are better. Do not skip doses or stop your medicine early. Talk to your pediatrician regarding the use of this medicine in children. While this drug may be prescribed for children as young as 5 years for selected conditions,  precautions do apply. Overdosage: If you think you have taken too much of this medicine contact a poison control center or emergency room at once. NOTE: This medicine is only for you. Do not share this medicine with others. What if I miss a dose? If you miss a dose, take it as soon as you can. If it is almost time for your next dose, take only that dose. Do not take double or extra doses. What may interact with this medicine? Do not take this medicine with any of the following medications: -atazanavir -nelfinavir This medicine may also interact with the following medications: -ampicillin -delavirdine -erlotinib -iron salts -medicines for fungal infections like ketoconazole, itraconazole and voriconazole -methotrexate -mycophenolate mofetil -warfarin This list may not describe all possible interactions. Give your health care provider a list of all the medicines, herbs, non-prescription drugs, or dietary supplements you use. Also tell them if you smoke, drink alcohol, or use illegal drugs. Some items may interact with your medicine. What should I watch for while using this medicine? It can take several days before your stomach pain gets better. Check with your doctor or health care professional if your condition does not start to get better, or if it gets worse. You may need blood work done while you are taking this medicine. This medicine may cause a decrease in vitamin B12. You should make sure that you get enough vitamin B12 while you are taking this medicine. Discuss the foods you eat and the vitamins you take with your health care professional. What side effects may I notice from receiving this medicine? Side effects that you should report to your doctor or health care professional as soon as possible: - allergic reactions like skin rash, itching or hives, swelling of the face, lips, or tongue - bone, muscle or joint pain - breathing problems - chest pain or chest  tightness - dark yellow or brown urine - dizziness - fast, irregular heartbeat - feeling faint or lightheaded - fever or sore throat - muscle spasm - palpitations - rash on cheeks or arms that gets worse in the sun - redness, blistering, peeling or loosening of the skin, including inside the mouth - seizures -stomach polyps - tremors - unusual bleeding or bruising - unusually weak or tired - yellowing of the eyes or skin Side effects that usually do not require medical attention (report to your doctor or health care professional if they continue or are bothersome): -  constipation - diarrhea - dry mouth - headache - nausea This list may not describe all possible side effects. Call your doctor for medical advice about side effects. You may report side effects to FDA at 1-800-FDA-1088. Where should I keep my medicine? Keep out of the reach of children. Store at room temperature between 15 and 30 degrees C (59 and 86 degrees F). Protect from light and moisture. Throw away any unused medicine after the expiration date. NOTE: This sheet is a summary. It may not cover all possible information. If you have questions about this medicine, talk to your doctor, pharmacist, or health care provider.  2019 Elsevier/Gold Standard (2016-09-13 13:51:22)   GERD information  Increase Protonix to 40 mg twice daily  Office visit with [place] in 3 months

## 2018-03-17 ENCOUNTER — Encounter (HOSPITAL_COMMUNITY): Payer: Self-pay | Admitting: Internal Medicine

## 2018-03-30 DIAGNOSIS — M961 Postlaminectomy syndrome, not elsewhere classified: Secondary | ICD-10-CM | POA: Diagnosis not present

## 2018-03-30 DIAGNOSIS — M5416 Radiculopathy, lumbar region: Secondary | ICD-10-CM | POA: Diagnosis not present

## 2018-04-06 DIAGNOSIS — M545 Low back pain: Secondary | ICD-10-CM | POA: Diagnosis not present

## 2018-04-09 DIAGNOSIS — M545 Low back pain: Secondary | ICD-10-CM | POA: Diagnosis not present

## 2018-04-13 DIAGNOSIS — M5416 Radiculopathy, lumbar region: Secondary | ICD-10-CM | POA: Diagnosis not present

## 2018-04-13 DIAGNOSIS — M961 Postlaminectomy syndrome, not elsewhere classified: Secondary | ICD-10-CM | POA: Diagnosis not present

## 2018-04-15 ENCOUNTER — Other Ambulatory Visit: Payer: Self-pay | Admitting: Orthopedic Surgery

## 2018-04-15 DIAGNOSIS — M5416 Radiculopathy, lumbar region: Secondary | ICD-10-CM

## 2018-04-21 ENCOUNTER — Ambulatory Visit
Admission: RE | Admit: 2018-04-21 | Discharge: 2018-04-21 | Disposition: A | Discharge status: Home / Self Care | Payer: Medicare Other | Source: Ambulatory Visit | Attending: Orthopedic Surgery | Admitting: Orthopedic Surgery

## 2018-04-21 DIAGNOSIS — M48061 Spinal stenosis, lumbar region without neurogenic claudication: Secondary | ICD-10-CM | POA: Diagnosis not present

## 2018-04-21 DIAGNOSIS — M5416 Radiculopathy, lumbar region: Secondary | ICD-10-CM

## 2018-04-21 DIAGNOSIS — M4316 Spondylolisthesis, lumbar region: Secondary | ICD-10-CM | POA: Diagnosis not present

## 2018-04-21 DIAGNOSIS — M5127 Other intervertebral disc displacement, lumbosacral region: Secondary | ICD-10-CM | POA: Diagnosis not present

## 2018-04-27 DIAGNOSIS — R202 Paresthesia of skin: Secondary | ICD-10-CM | POA: Insufficient documentation

## 2018-05-04 ENCOUNTER — Other Ambulatory Visit: Payer: Self-pay | Admitting: Family Medicine

## 2018-05-04 DIAGNOSIS — M259 Joint disorder, unspecified: Secondary | ICD-10-CM | POA: Diagnosis not present

## 2018-05-04 DIAGNOSIS — M5416 Radiculopathy, lumbar region: Secondary | ICD-10-CM | POA: Diagnosis not present

## 2018-05-04 DIAGNOSIS — M545 Low back pain: Secondary | ICD-10-CM | POA: Diagnosis not present

## 2018-05-07 ENCOUNTER — Other Ambulatory Visit: Payer: Self-pay | Admitting: Orthopedic Surgery

## 2018-05-07 DIAGNOSIS — M259 Joint disorder, unspecified: Secondary | ICD-10-CM

## 2018-05-27 ENCOUNTER — Ambulatory Visit (INDEPENDENT_AMBULATORY_CARE_PROVIDER_SITE_OTHER): Payer: Medicare Other | Admitting: Gastroenterology

## 2018-05-27 ENCOUNTER — Encounter: Payer: Self-pay | Admitting: Gastroenterology

## 2018-05-27 ENCOUNTER — Other Ambulatory Visit: Payer: Self-pay

## 2018-05-27 DIAGNOSIS — K219 Gastro-esophageal reflux disease without esophagitis: Secondary | ICD-10-CM | POA: Diagnosis not present

## 2018-05-27 DIAGNOSIS — K5900 Constipation, unspecified: Secondary | ICD-10-CM

## 2018-05-27 DIAGNOSIS — R1310 Dysphagia, unspecified: Secondary | ICD-10-CM

## 2018-05-27 MED ORDER — LUBIPROSTONE 24 MCG PO CAPS: 24.0000 ug | ORAL_CAPSULE | Freq: Every day | ORAL | 3 refills | Status: DC

## 2018-05-27 MED ORDER — PANTOPRAZOLE SODIUM 40 MG PO TBEC: 40.0000 mg | DELAYED_RELEASE_TABLET | Freq: Every day | ORAL | 3 refills | Status: DC

## 2018-05-27 NOTE — Patient Instructions (Signed)
Continue Protonix once daily. We can order a swallow test if needed in the future.  Decrease Amitiza to once daily with a meal. I have sent this to your pharmacy as well.  I will see you in 6 months or sooner if needed!  I enjoyed seeing you again today! As you know, I value our relationship and want to provide genuine, compassionate, and quality care. I welcome your feedback. If you receive a survey regarding your visit,  I greatly appreciate you taking time to fill this out. See you next time!  Annitta Needs, PhD, ANP-BC [facility]
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
## 2018-05-28 ENCOUNTER — Encounter: Payer: Self-pay | Admitting: Internal Medicine

## 2018-05-28 NOTE — Progress Notes (Signed)
cc'ed to pcp °

## 2018-06-03 ENCOUNTER — Other Ambulatory Visit: Payer: Self-pay | Admitting: Family Medicine

## 2018-06-05 ENCOUNTER — Other Ambulatory Visit: Payer: Self-pay | Admitting: Family Medicine

## 2018-06-17 ENCOUNTER — Encounter (HOSPITAL_COMMUNITY): Payer: Self-pay | Admitting: Internal Medicine

## 2018-06-23 DIAGNOSIS — M533 Sacrococcygeal disorders, not elsewhere classified: Secondary | ICD-10-CM | POA: Diagnosis not present

## 2018-07-05 ENCOUNTER — Other Ambulatory Visit: Payer: Self-pay | Admitting: Family Medicine

## 2018-07-07 DIAGNOSIS — M545 Low back pain: Secondary | ICD-10-CM | POA: Diagnosis not present

## 2018-07-07 DIAGNOSIS — Q7649 Other congenital malformations of spine, not associated with scoliosis: Secondary | ICD-10-CM | POA: Diagnosis not present

## 2018-07-07 DIAGNOSIS — M961 Postlaminectomy syndrome, not elsewhere classified: Secondary | ICD-10-CM | POA: Diagnosis not present

## 2018-07-10 ENCOUNTER — Other Ambulatory Visit: Payer: Medicare Other

## 2018-07-10 ENCOUNTER — Ambulatory Visit (INDEPENDENT_AMBULATORY_CARE_PROVIDER_SITE_OTHER): Payer: Medicare Other | Admitting: Family Medicine

## 2018-07-10 ENCOUNTER — Other Ambulatory Visit: Payer: Self-pay

## 2018-07-10 ENCOUNTER — Encounter: Payer: Self-pay | Admitting: Family Medicine

## 2018-07-10 DIAGNOSIS — K219 Gastro-esophageal reflux disease without esophagitis: Secondary | ICD-10-CM

## 2018-07-10 DIAGNOSIS — F419 Anxiety disorder, unspecified: Secondary | ICD-10-CM

## 2018-07-10 MED ORDER — ALPRAZOLAM 0.5 MG PO TABS: 0.7500 mg | ORAL_TABLET | Freq: Every day | ORAL | 1 refills | Status: DC

## 2018-07-10 MED ORDER — HYDROXYZINE HCL 25 MG PO TABS: 25.0000 mg | ORAL_TABLET | Freq: Three times a day (TID) | ORAL | 5 refills | Status: DC | PRN

## 2018-07-10 MED ORDER — ARIPIPRAZOLE 15 MG PO TABS: 15.0000 mg | ORAL_TABLET | Freq: Every day | ORAL | 1 refills | Status: DC

## 2018-07-10 NOTE — Progress Notes (Signed)
Subjective:    Patient ID: Shelby [name], female    DOB: [birth year], 43 y.o.   MRN: [id number]   HPI: Shelby [name] is a 43 y.o. female presenting for continued problems with anxiety.  We discussed her GAD 7 noted below.  She does get good relief with the combination of medicines as noted below as well.  Patient in for follow-up of GERD. Currently asymptomatic taking  PPI daily. There is no chest pain or heartburn. No hematemesis and no melena. No dysphagia or choking. Onset is remote. Progression is stable. Complicating factors, none.    GAD 7 : Generalized Anxiety Score 07/10/2018 10/25/2015 05/02/2015 02/09/2015  Nervous, Anxious, on Edge 3 3 3 2   Control/stop worrying 2 3 3 3   Worry too much - different things 2 3 3 3   Trouble relaxing 1 3 3 3   Restless 1 2 2 3   Easily annoyed or irritable 2 3 3 3   Afraid - awful might happen 0 2 0 0  Total GAD 7 Score 11 19 17 17   Anxiety Difficulty Somewhat difficult Very difficult Somewhat difficult Somewhat difficult      Depression screen [facility] 2/9 02/06/2018 10/15/2017 08/29/2017 07/15/2017 04/14/2017  Decreased Interest 0 0 0 0 2  Down, Depressed, Hopeless 0 0 0 0 1  PHQ - 2 Score 0 0 0 0 3  Altered sleeping - - - - 0  Tired, decreased energy - - - - 2  Change in appetite - - - - 1  Feeling bad or failure about yourself  - - - - 0  Trouble concentrating - - - - 1  Moving slowly or fidgety/restless - - - - 0  Suicidal thoughts - - - - 0  PHQ-9 Score - - - - 7  Difficult doing work/chores - - - - -  Some recent data might be hidden     Relevant past medical, surgical, family and social history reviewed and updated as indicated.  Interim medical history since our last visit reviewed. Allergies and medications reviewed and updated.  ROS:  Review of Systems  Constitutional: Negative.   HENT: Negative.   Eyes: Negative for visual disturbance.  Respiratory: Negative for shortness of breath.   Cardiovascular: Negative for chest  pain.  Gastrointestinal: Negative for abdominal pain.  Musculoskeletal: Negative for arthralgias.     Social History   Tobacco Use  Smoking Status Current Every Day Smoker  . Packs/day: 1.50  . Years: 25.00  . Pack years: 37.50  . Types: Cigarettes  Smokeless Tobacco Never Used       Objective:     Wt Readings from Last 3 Encounters:  03/12/18 243 lb (110.2 kg)  02/20/18 255 lb (115.7 kg)  02/06/18 256 lb (116.1 kg)     Exam deferred. Pt. Harboring due to COVID 19. Phone visit performed.   Assessment & Plan:   1. Anxiety   2. Gastroesophageal reflux disease, esophagitis presence not specified     Meds ordered this encounter  Medications  . ARIPiprazole (ABILIFY) 15 MG tablet    Sig: Take 1 tablet (15 mg total) by mouth at bedtime.    Dispense:  90 tablet    Refill:  1  . hydrOXYzine (ATARAX/VISTARIL) 25 MG tablet    Sig: Take 1 tablet (25 mg total) by mouth 3 (three) times daily as needed for anxiety. TAKE 1 TABLET(25 MG) BY MOUTH TID DAILY AS NEEDED FOR ANXIETY    Dispense:  90 tablet  Refill:  5  . ALPRAZolam (XANAX) 0.5 MG tablet    Sig: Take 1.5 tablets (0.75 mg total) by mouth at bedtime.    Dispense:  135 tablet    Refill:  1    No orders of the defined types were placed in this encounter.     Diagnoses and all orders for this visit:  Anxiety  Gastroesophageal reflux disease, esophagitis presence not specified  Other orders -     ARIPiprazole (ABILIFY) 15 MG tablet; Take 1 tablet (15 mg total) by mouth at bedtime. -     hydrOXYzine (ATARAX/VISTARIL) 25 MG tablet; Take 1 tablet (25 mg total) by mouth 3 (three) times daily as needed for anxiety. TAKE 1 TABLET(25 MG) BY MOUTH TID DAILY AS NEEDED FOR ANXIETY -     ALPRAZolam (XANAX) 0.5 MG tablet; Take 1.5 tablets (0.75 mg total) by mouth at bedtime.    Virtual Visit via telephone Note  I discussed the limitations, risks, security and privacy concerns of performing an evaluation and management  service by telephone and the availability of in person appointments. The patient was identified with two identifiers. Pt.expressed understanding and agreed to proceed. Pt. Is at home. Dr. Livia Snellen is in his office.  Follow Up Instructions:   I discussed the assessment and treatment plan with the patient. The patient was provided an opportunity to ask questions and all were answered. The patient agreed with the plan and demonstrated an understanding of the instructions.   The patient was advised to call back or seek an in-person evaluation if the symptoms worsen or if the condition fails to improve as anticipated.   Total minutes including chart review and phone contact time: 20   Follow up plan: Return in about 6 months (around 01/10/2019).  Claretta Fraise, MD [place]

## 2018-07-20 DIAGNOSIS — M5136 Other intervertebral disc degeneration, lumbar region: Secondary | ICD-10-CM | POA: Diagnosis not present

## 2018-07-20 DIAGNOSIS — M961 Postlaminectomy syndrome, not elsewhere classified: Secondary | ICD-10-CM | POA: Diagnosis not present

## 2018-07-30 ENCOUNTER — Other Ambulatory Visit: Payer: Self-pay | Admitting: Neurological Surgery

## 2018-07-30 ENCOUNTER — Other Ambulatory Visit: Payer: Self-pay

## 2018-07-30 ENCOUNTER — Ambulatory Visit
Admission: RE | Admit: 2018-07-30 | Discharge: 2018-07-30 | Disposition: A | Discharge status: Home / Self Care | Payer: Medicare Other | Source: Ambulatory Visit | Attending: Neurological Surgery | Admitting: Neurological Surgery

## 2018-07-30 DIAGNOSIS — Z6841 Body Mass Index (BMI) 40.0 and over, adult: Secondary | ICD-10-CM | POA: Diagnosis not present

## 2018-07-30 DIAGNOSIS — R9389 Abnormal findings on diagnostic imaging of other specified body structures: Secondary | ICD-10-CM

## 2018-07-30 DIAGNOSIS — R030 Elevated blood-pressure reading, without diagnosis of hypertension: Secondary | ICD-10-CM | POA: Diagnosis not present

## 2018-07-30 DIAGNOSIS — J439 Emphysema, unspecified: Secondary | ICD-10-CM | POA: Diagnosis not present

## 2018-07-30 DIAGNOSIS — R918 Other nonspecific abnormal finding of lung field: Secondary | ICD-10-CM | POA: Diagnosis not present

## 2018-07-30 MED ORDER — IOPAMIDOL (ISOVUE-300) INJECTION 61%
75.0000 mL | Freq: Once | INTRAVENOUS | Status: AC | PRN
  Administered 2018-07-30: 75 mL via INTRAVENOUS

## 2018-08-04 DIAGNOSIS — R9389 Abnormal findings on diagnostic imaging of other specified body structures: Secondary | ICD-10-CM | POA: Diagnosis not present

## 2018-08-10 ENCOUNTER — Ambulatory Visit: Payer: Medicare Other | Admitting: Family Medicine

## 2018-09-11 ENCOUNTER — Encounter: Payer: Self-pay | Admitting: Nurse Practitioner

## 2018-09-11 ENCOUNTER — Ambulatory Visit (INDEPENDENT_AMBULATORY_CARE_PROVIDER_SITE_OTHER): Payer: Medicare Other | Admitting: Nurse Practitioner

## 2018-09-11 DIAGNOSIS — R55 Syncope and collapse: Secondary | ICD-10-CM | POA: Diagnosis not present

## 2018-09-11 NOTE — Progress Notes (Signed)
   Virtual Visit via telephone Note Due to COVID-19 pandemic this visit was conducted virtually. This visit type was conducted due to national recommendations for restrictions regarding the COVID-19 Pandemic (e.g. social distancing, sheltering in place) in an effort to limit this patient's exposure and mitigate transmission in our community. All issues noted in this document were discussed and addressed.  A physical exam was not performed with this format.  I connected with Shelby [name] on 09/11/18 at 10:50 by telephone and verified that I am speaking with the correct person using two identifiers. Shelby [name] is currently located at HOME and NO ONE is currently with her during visit. The provider, [name] Done, FNP is located in their office at time of visit.  I discussed the limitations, risks, security and privacy concerns of performing an evaluation and management service by telephone and the availability of in person appointments. I also discussed with the patient that there may be a patient responsible charge related to this service. The patient expressed understanding and agreed to proceed.   History and Present Illness:   Chief Complaint: Loss of Consciousness   HPI Patient says that she has been having episodes where she felt like she was going to pass out. Started a couple of weeks ago. Occurs daily. usually feels dizzy then feels like she may pass out. Has not actually passed out. Yesterday it occurred when she was getting out of her car and she also felt like her heart was racing.   Review of Systems  Constitutional: Negative for diaphoresis and weight loss.  Eyes: Negative for blurred vision, double vision and pain.  Respiratory: Negative for shortness of breath.   Cardiovascular: Negative for chest pain, palpitations, orthopnea and leg swelling.  Gastrointestinal: Negative for abdominal pain.  Skin: Negative for rash.  Neurological: Negative for dizziness,  sensory change, loss of consciousness, weakness and headaches.  Endo/Heme/Allergies: Negative for polydipsia. Does not bruise/bleed easily.  Psychiatric/Behavioral: Negative for memory loss. The patient does not have insomnia.   All other systems reviewed and are negative.    Observations/Objective: Alert and oriented- answers all questions appropriately  no distress note din voice.  Assessment and Plan: Shelby [name] in today with chief complaint of Loss of Consciousness   1. Near syncope Keep diary of episodes Force fluids    Follow Up Instructions: Next Thursday at 8:45    I discussed the assessment and treatment plan with the patient. The patient was provided an opportunity to ask questions and all were answered. The patient agreed with the plan and demonstrated an understanding of the instructions.   The patient was advised to call back or seek an in-person evaluation if the symptoms worsen or if the condition fails to improve as anticipated.  The above assessment and management plan was discussed with the patient. The patient verbalized understanding of and has agreed to the management plan. Patient is aware to call the clinic if symptoms persist or worsen. Patient is aware when to return to the clinic for a follow-up visit. Patient educated on when it is appropriate to go to the emergency department.   Time call ended:  11:02  I provided 12 minutes of non-face-to-face time during this encounter.    [name] Done, FNP

## 2018-09-15 ENCOUNTER — Other Ambulatory Visit: Payer: Self-pay | Admitting: Neurological Surgery

## 2018-09-15 DIAGNOSIS — R9389 Abnormal findings on diagnostic imaging of other specified body structures: Secondary | ICD-10-CM

## 2018-09-16 ENCOUNTER — Other Ambulatory Visit: Payer: Self-pay

## 2018-09-17 ENCOUNTER — Other Ambulatory Visit: Payer: Self-pay

## 2018-09-17 ENCOUNTER — Ambulatory Visit (INDEPENDENT_AMBULATORY_CARE_PROVIDER_SITE_OTHER): Payer: Medicare Other | Admitting: Family Medicine

## 2018-09-17 VITALS — BP 105/75 | HR 73 | Temp 98.9°F | Ht 69.0 in | Wt 256.0 lb

## 2018-09-17 DIAGNOSIS — R55 Syncope and collapse: Secondary | ICD-10-CM

## 2018-09-18 LAB — CMP14+EGFR
ALT: 13 IU/L (ref 0–32)
AST: 11 IU/L (ref 0–40)
Albumin/Globulin Ratio: 1.7 (ref 1.2–2.2)
Albumin: 4.3 g/dL (ref 3.8–4.8)
Alkaline Phosphatase: 88 IU/L (ref 39–117)
BUN/Creatinine Ratio: 5 — ABNORMAL LOW (ref 9–23)
BUN: 4 mg/dL — ABNORMAL LOW (ref 6–24)
Bilirubin Total: 0.2 mg/dL (ref 0.0–1.2)
CO2: 23 mmol/L (ref 20–29)
Calcium: 9.2 mg/dL (ref 8.7–10.2)
Chloride: 101 mmol/L (ref 96–106)
Creatinine, Ser: 0.78 mg/dL (ref 0.57–1.00)
GFR calc Af Amer: 108 mL/min/{1.73_m2} (ref >59)
GFR calc non Af Amer: 94 mL/min/{1.73_m2} (ref >59)
Globulin, Total: 2.5 g/dL (ref 1.5–4.5)
Glucose: 65 mg/dL (ref 65–99)
Potassium: 3.9 mmol/L (ref 3.5–5.2)
Sodium: 142 mmol/L (ref 134–144)
Total Protein: 6.8 g/dL (ref 6.0–8.5)

## 2018-09-18 LAB — CBC WITH DIFFERENTIAL/PLATELET
Basophils Absolute: 0.1 10*3/uL (ref 0.0–0.2)
Basos: 1 %
EOS (ABSOLUTE): 0.7 10*3/uL — ABNORMAL HIGH (ref 0.0–0.4)
Eos: 5 %
Hematocrit: 44.2 % (ref 34.0–46.6)
Hemoglobin: 14.6 g/dL (ref 11.1–15.9)
Immature Grans (Abs): 0 10*3/uL (ref 0.0–0.1)
Immature Granulocytes: 0 %
Lymphocytes Absolute: 2.8 10*3/uL (ref 0.7–3.1)
Lymphs: 20 %
MCH: 29 pg (ref 26.6–33.0)
MCHC: 33 g/dL (ref 31.5–35.7)
MCV: 88 fL (ref 79–97)
Monocytes Absolute: 0.8 10*3/uL (ref 0.1–0.9)
Monocytes: 6 %
Neutrophils Absolute: 9.2 10*3/uL — ABNORMAL HIGH (ref 1.4–7.0)
Neutrophils: 68 %
Platelets: 346 10*3/uL (ref 150–450)
RBC: 5.04 x10E6/uL (ref 3.77–5.28)
RDW: 13.1 % (ref 11.7–15.4)
WBC: 13.6 10*3/uL — ABNORMAL HIGH (ref 3.4–10.8)

## 2018-09-20 ENCOUNTER — Encounter: Payer: Self-pay | Admitting: Family Medicine

## 2018-09-20 NOTE — Progress Notes (Signed)
Hello Charlette,  Your lab result is normal and/or stable.Some minor variations that are not significant are commonly marked abnormal, but do not represent any medical problem for you.  Best regards, Kizzy Olafson, M.D.

## 2018-09-20 NOTE — Progress Notes (Signed)
Subjective:  Patient ID: Shelby [name], female    DOB: [birth year]  Age: 43 y.o. MRN: [id number]  CC: Dizziness  's last week and HPI TAHIRA [name] presents for Follow-up on her near syncope.  She states that she is having some daily occurrences over the last couple weeks where she feels lightheaded like she might pass out but she has not actually passed out.  She was treated for this in the office last week and that note is reviewed and appreciated per Shelby [name] Done.  Additionally she has a sensation at times when is she feels like her heart is racing.  She denies chest pain and shortness of breath associated with that.  These episodes generally last just a few minutes when she first stands up.  There is no vertigo associated.  No headache.  Her vision seems to go dark momentarily.  She has been trying to take in more fluids and that seems to be helping somewhat.  Depression screen [facility] 2/9 09/17/2018 02/06/2018 10/15/2017  Decreased Interest 0 0 0  Down, Depressed, Hopeless 0 0 0  PHQ - 2 Score 0 0 0  Altered sleeping 0 - -  Tired, decreased energy 0 - -  Change in appetite 0 - -  Feeling bad or failure about yourself  0 - -  Trouble concentrating 0 - -  Moving slowly or fidgety/restless 0 - -  Suicidal thoughts 0 - -  PHQ-9 Score 0 - -  Difficult doing work/chores - - -  Some recent data might be hidden    History Shelby [name] has a past medical history of Anxiety, Arthritis, Asthma, Bad memory, Bipolar disorder ([place]), Chronic kidney disease, Depression, Endometriosis, Migraines, and Traumatic brain injury ([place]) (2001).   She has a past surgical history that includes Cesarean section; cyst remove; uterine ablation; Tubal ligation; Umbilical hernia repair (09/20/2011); Incisional hernia repair (12/27/2011); Wound debridement (12/27/2011); Insertion of mesh (12/27/2011); Hand surgery; Abdominal hysterectomy (06/2017); Colonoscopy with propofol (N/A, 09/22/2017);  Esophagogastroduodenoscopy (egd) with propofol (N/A, 03/12/2018); and maloney dilation (N/A, 03/12/2018).   Her family history includes Alzheimer's disease in her maternal grandmother and paternal grandmother; Colon cancer in her paternal grandfather; Diabetes in her father; Healthy in her daughter and daughter.She reports that she has been smoking cigarettes. She has a 37.50 pack-year smoking history. She has never used smokeless tobacco. She reports that she does not drink alcohol or use drugs.    ROS Review of Systems  Constitutional: Negative.   HENT: Negative for congestion.   Eyes: Negative for visual disturbance.  Respiratory: Negative for shortness of breath.   Cardiovascular: Negative for chest pain.  Gastrointestinal: Negative for abdominal pain, constipation, diarrhea, nausea and vomiting.  Genitourinary: Negative for difficulty urinating.  Musculoskeletal: Negative for arthralgias and myalgias.  Neurological: Negative for headaches.  Psychiatric/Behavioral: Negative for sleep disturbance.    Objective:  BP 105/75   Pulse 73   Temp 98.9 F (37.2 C) (Oral)   Ht 5' 9"  (1.753 m)   Wt 256 lb (116.1 kg)   LMP 05/12/2016   BMI 37.80 kg/m   BP Readings from Last 3 Encounters:  09/17/18 105/75  03/12/18 108/73  02/20/18 (!) 136/94    Wt Readings from Last 3 Encounters:  09/17/18 256 lb (116.1 kg)  03/12/18 243 lb (110.2 kg)  02/20/18 255 lb (115.7 kg)     Physical Exam Constitutional:      General: She is not in acute distress.    Appearance: She is  well-developed.  HENT:     Head: Normocephalic and atraumatic.     Right Ear: External ear normal.     Left Ear: External ear normal.     Nose: Nose normal.  Eyes:     Conjunctiva/sclera: Conjunctivae normal.     Pupils: Pupils are equal, round, and reactive to light.  Neck:     Musculoskeletal: Normal range of motion and neck supple.     Thyroid: No thyromegaly.  Cardiovascular:     Rate and Rhythm: Normal  rate and regular rhythm.     Heart sounds: Normal heart sounds. No murmur.  Pulmonary:     Effort: Pulmonary effort is normal. No respiratory distress.     Breath sounds: Normal breath sounds. No wheezing or rales.  Abdominal:     General: Bowel sounds are normal. There is no distension.     Palpations: Abdomen is soft.     Tenderness: There is no abdominal tenderness.  Lymphadenopathy:     Cervical: No cervical adenopathy.  Skin:    General: Skin is warm and dry.  Neurological:     Mental Status: She is alert and oriented to person, place, and time.     Deep Tendon Reflexes: Reflexes are normal and symmetric.  Psychiatric:        Behavior: Behavior normal.        Thought Content: Thought content normal.        Judgment: Judgment normal.       Assessment & Plan:   Tessy was seen today for dizziness.  Diagnoses and all orders for this visit:  Near syncope -     CBC with Differential/Platelet -     CMP14+EGFR       I have discontinued Tracina M. Poinsett's lubiprostone. I am also having her maintain her acetaminophen, cyclobenzaprine, ibuprofen, albuterol, sertraline, fluticasone furoate-vilanterol, polyethylene glycol, pantoprazole, ARIPiprazole, hydrOXYzine, ALPRAZolam, and meloxicam.  Allergies as of 09/17/2018      Reactions   Chantix [varenicline] Other (See Comments)   Nightmares   Penicillins Other (See Comments)   Unknown Has patient had a PCN reaction causing immediate rash, facial/tongue/throat swelling, SOB or lightheadedness with hypotension: Unknown Has patient had a PCN reaction causing severe rash involving mucus membranes or skin necrosis: Unknown Has patient had a PCN reaction that required hospitalization: Unknown Has patient had a PCN reaction occurring within the last 10 years: No If all of the above answers are "NO", then may proceed with Cephalosporin use.   Latex Rash      Medication List       Accurate as of September 17, 2018 11:59 PM. If you  have any questions, ask your nurse or doctor.        STOP taking these medications   lubiprostone 24 MCG capsule Commonly known as: AMITIZA Stopped by: Claretta Fraise, MD     TAKE these medications   acetaminophen 650 MG CR tablet Commonly known as: TYLENOL Take 1,300 mg by mouth 2 (two) times daily as needed for pain.   albuterol 108 (90 Base) MCG/ACT inhaler Commonly known as: VENTOLIN HFA Inhale 2 puffs into the lungs every 6 (six) hours as needed for wheezing or shortness of breath.   ALPRAZolam 0.5 MG tablet Commonly known as: XANAX Take 1.5 tablets (0.75 mg total) by mouth at bedtime.   ARIPiprazole 15 MG tablet Commonly known as: ABILIFY Take 1 tablet (15 mg total) by mouth at bedtime.   Breo Ellipta 100-25 MCG/INH Aepb Generic drug:  fluticasone furoate-vilanterol Inhale 1 puff into the lungs daily.   cyclobenzaprine 10 MG tablet Commonly known as: FLEXERIL Take 1 tablet (10 mg total) by mouth 3 (three) times daily as needed for muscle spasms.   hydrOXYzine 25 MG tablet Commonly known as: ATARAX/VISTARIL Take 1 tablet (25 mg total) by mouth 3 (three) times daily as needed for anxiety. TAKE 1 TABLET(25 MG) BY MOUTH TID DAILY AS NEEDED FOR ANXIETY   ibuprofen 200 MG tablet Commonly known as: ADVIL Take 400 mg by mouth every 8 (eight) hours as needed (for inflammation.).   meloxicam 15 MG tablet Commonly known as: MOBIC meloxicam 15 mg tablet  TK 1 T PO QD AFTER MEALS   pantoprazole 40 MG tablet Commonly known as: PROTONIX Take 1 tablet (40 mg total) by mouth daily. Take 30 minutes before breakfast   polyethylene glycol 17 g packet Commonly known as: MIRALAX / GLYCOLAX Take 17 g by mouth daily as needed.   sertraline 100 MG tablet Commonly known as: ZOLOFT Take 1 tablet (100 mg total) by mouth daily. For the first week take only 1/2 tablet daily. Then increase to a full dose. What changed: additional instructions      Continue with aggressive  hydration orally.  We discussed 3-4 bottles of water half liter each daily.  I believe heat dehydration and her mix of medications are the multifactorial causes of her situation.  Orthostatics done today are negative.  Follow-up: No follow-ups on file.  Claretta Fraise, M.D.

## 2018-09-23 ENCOUNTER — Ambulatory Visit (INDEPENDENT_AMBULATORY_CARE_PROVIDER_SITE_OTHER): Payer: Medicare Other | Admitting: Family Medicine

## 2018-09-23 ENCOUNTER — Encounter: Payer: Self-pay | Admitting: Family Medicine

## 2018-09-23 DIAGNOSIS — K219 Gastro-esophageal reflux disease without esophagitis: Secondary | ICD-10-CM | POA: Diagnosis not present

## 2018-09-23 DIAGNOSIS — K222 Esophageal obstruction: Secondary | ICD-10-CM

## 2018-09-23 MED ORDER — PANTOPRAZOLE SODIUM 40 MG PO TBEC: 40.0000 mg | DELAYED_RELEASE_TABLET | Freq: Two times a day (BID) | ORAL | 3 refills | Status: DC

## 2018-09-23 NOTE — Progress Notes (Signed)
Subjective:    Patient ID: Shelby [name], female    DOB: Sep 26, 1975, 43 y.o.   MRN: [id number]   HPI: Shelby [name] is a 43 y.o. female presenting for swallowing problem. Has had for a while. Food gets stuck, really bad in her throat. Has choked a few times, but has been able to throw them up. Hard to chew since her dentures don't fit right. Realizes bread, red meat and potatoes tend to clump and can be more likely to lead to choking.   Depression screen [facility] 2/9 09/17/2018 02/06/2018 10/15/2017 08/29/2017 07/15/2017  Decreased Interest 0 0 0 0 0  Down, Depressed, Hopeless 0 0 0 0 0  PHQ - 2 Score 0 0 0 0 0  Altered sleeping 0 - - - -  Tired, decreased energy 0 - - - -  Change in appetite 0 - - - -  Feeling bad or failure about yourself  0 - - - -  Trouble concentrating 0 - - - -  Moving slowly or fidgety/restless 0 - - - -  Suicidal thoughts 0 - - - -  PHQ-9 Score 0 - - - -  Difficult doing work/chores - - - - -  Some recent data might be hidden     Relevant past medical, surgical, family and social history reviewed and updated as indicated.  Interim medical history since our last visit reviewed. Allergies and medications reviewed and updated.  ROS:  Review of Systems  Constitutional: Negative.   HENT: Positive for trouble swallowing.   Eyes: Negative for visual disturbance.  Respiratory: Negative for shortness of breath.   Cardiovascular: Negative for chest pain.  Gastrointestinal: Negative for abdominal pain.  Musculoskeletal: Negative for arthralgias.     Social History   Tobacco Use  Smoking Status Current Every Day Smoker  . Packs/day: 1.50  . Years: 25.00  . Pack years: 37.50  . Types: Cigarettes  Smokeless Tobacco Never Used       Objective:     Wt Readings from Last 3 Encounters:  09/17/18 256 lb (116.1 kg)  03/12/18 243 lb (110.2 kg)  02/20/18 255 lb (115.7 kg)     Exam deferred. Pt. Harboring due to COVID 19. Phone visit performed.    Assessment & Plan:   1. Esophageal stricture   2. Gastroesophageal reflux disease, esophagitis presence not specified     Meds ordered this encounter  Medications  . pantoprazole (PROTONIX) 40 MG tablet    Sig: Take 1 tablet (40 mg total) by mouth 2 (two) times daily. Take 30 minutes before breakfast    Dispense:  180 tablet    Refill:  3    Orders Placed This Encounter  Procedures  . Ambulatory referral to Gastroenterology    Referral Priority:   Routine    Referral Type:   Consultation    Referral Reason:   Specialty Services Required    Referred to Provider:   Daneil Dolin, MD    Number of Visits Requested:   1      Diagnoses and all orders for this visit:  Esophageal stricture -     Ambulatory referral to Gastroenterology  Gastroesophageal reflux disease, esophagitis presence not specified -     Ambulatory referral to Gastroenterology  Other orders -     pantoprazole (PROTONIX) 40 MG tablet; Take 1 tablet (40 mg total) by mouth 2 (two) times daily. Take 30 minutes before breakfast    Virtual Visit via telephone  Note  I discussed the limitations, risks, security and privacy concerns of performing an evaluation and management service by telephone and the availability of in person appointments. The patient was identified with two identifiers. Pt.expressed understanding and agreed to proceed. Pt. Is at home. Dr. Livia Snellen is in his office.  Follow Up Instructions:   I discussed the assessment and treatment plan with the patient. The patient was provided an opportunity to ask questions and all were answered. The patient agreed with the plan and demonstrated an understanding of the instructions.   The patient was advised to call back or seek an in-person evaluation if the symptoms worsen or if the condition fails to improve as anticipated.   Total minutes including chart review and phone contact time: 18  Take very small bites, chew thoroughly. Avoid foods that tend  to clump. Use soft diet. Puree foods in blender if in doubt.  Follow up plan: Return if symptoms worsen or fail to improve.  Claretta Fraise, MD [place]

## 2018-09-28 ENCOUNTER — Other Ambulatory Visit: Payer: Medicare Other

## 2018-10-06 ENCOUNTER — Other Ambulatory Visit: Payer: Self-pay

## 2018-10-06 ENCOUNTER — Ambulatory Visit (INDEPENDENT_AMBULATORY_CARE_PROVIDER_SITE_OTHER): Payer: Medicare Other | Admitting: Gastroenterology

## 2018-10-06 ENCOUNTER — Encounter: Payer: Self-pay | Admitting: Gastroenterology

## 2018-10-06 ENCOUNTER — Encounter: Payer: Self-pay | Admitting: *Deleted

## 2018-10-06 VITALS — BP 130/80 | HR 88 | Temp 97.1°F | Ht 68.0 in | Wt 257.8 lb

## 2018-10-06 DIAGNOSIS — K2100 Gastro-esophageal reflux disease with esophagitis, without bleeding: Secondary | ICD-10-CM

## 2018-10-06 DIAGNOSIS — R1319 Other dysphagia: Secondary | ICD-10-CM

## 2018-10-06 DIAGNOSIS — R1310 Dysphagia, unspecified: Secondary | ICD-10-CM | POA: Diagnosis not present

## 2018-10-06 DIAGNOSIS — K5900 Constipation, unspecified: Secondary | ICD-10-CM | POA: Diagnosis not present

## 2018-10-06 MED ORDER — PANTOPRAZOLE SODIUM 40 MG PO TBEC: 40.0000 mg | DELAYED_RELEASE_TABLET | Freq: Two times a day (BID) | ORAL | 3 refills | Status: DC

## 2018-10-06 NOTE — Assessment & Plan Note (Signed)
Continues to have issues swallowing solid foods.  Empiric esophageal dilation did not help significantly back in January.  She does have issues with poor fitting dentures, and unable to wear while eating.  She reports that she chews her food finely as possible and also cuts meats finely when needed.  She tolerates breads, more difficulty with pills and meat.  Evaluate with barium pill esophagram.

## 2018-10-06 NOTE — Assessment & Plan Note (Signed)
Symptoms reasonably well controlled on pantoprazole 40 mg twice daily.  New prescription provided.

## 2018-10-06 NOTE — Patient Instructions (Signed)
1. Continue pantoprazole twice daily before breakfast and evening meal.  Prescription sent to pharmacy. 2. X-ray of your esophagus to further evaluate your swallowing issues. 3. Take MiraLAX 1 capful daily, if you have not had a bowel movement in 24 hours.  You have to be consistent with MiraLAX to make it work.

## 2018-10-06 NOTE — Progress Notes (Signed)
Primary Care Physician: Claretta Fraise, MD  Primary Gastroenterologist:  Garfield Cornea, MD   Chief Complaint  Patient presents with  . Dysphagia    sometimes vomits when eating    HPI: Shelby [name] is a 43 y.o. female here for follow-up.  She has a history of chronic GERD, dysphagia, status post EGD in January 2020: Esophagitis, empiric dilation, normal stomach and duodenum.  She also has chronic constipation.  Last colonoscopy August 2019, nonbleeding internal hemorrhoids.  Continues to have swallowing issues.  Really did not see significant benefit from dilatation.  Liquids do okay. Swallow medications ok with a lot of fluid.  Takes a lot more liquid than usual.  When she tries to eat she has coughing and sometimes vomits the food back up.  Feels like it will go down.  Meat is the hardest.  Finally chops her food and then choose the best she can.  Unable to wear her dentures because they do not fit well.  No problems swallowing liquids.  Not much heartburn.  No abdominal pain.  Bowel movement every few days taking MiraLAX as needed.  Stools too frequent and loose on Amitiza 24 mcg daily, Linzess previously.  Current Outpatient Medications  Medication Sig Dispense Refill  . acetaminophen (TYLENOL) 650 MG CR tablet Take 1,300 mg by mouth 2 (two) times daily as needed for pain.    Marland Kitchen albuterol (PROVENTIL HFA;VENTOLIN HFA) 108 (90 Base) MCG/ACT inhaler Inhale 2 puffs into the lungs every 6 (six) hours as needed for wheezing or shortness of breath. 1 Inhaler 6  . ALPRAZolam (XANAX) 0.5 MG tablet Take 1.5 tablets (0.75 mg total) by mouth at bedtime. 135 tablet 1  . ARIPiprazole (ABILIFY) 15 MG tablet Take 1 tablet (15 mg total) by mouth at bedtime. 90 tablet 1  . cyclobenzaprine (FLEXERIL) 10 MG tablet Take 1 tablet (10 mg total) by mouth 3 (three) times daily as needed for muscle spasms. 60 tablet 1  . fluticasone furoate-vilanterol (BREO ELLIPTA) 100-25 MCG/INH AEPB Inhale 1 puff  into the lungs daily.    . hydrOXYzine (ATARAX/VISTARIL) 25 MG tablet Take 1 tablet (25 mg total) by mouth 3 (three) times daily as needed for anxiety. TAKE 1 TABLET(25 MG) BY MOUTH TID DAILY AS NEEDED FOR ANXIETY 90 tablet 5  . ibuprofen (ADVIL,MOTRIN) 200 MG tablet Take 400 mg by mouth every 8 (eight) hours as needed (for inflammation.).    Marland Kitchen pantoprazole (PROTONIX) 40 MG tablet Take 1 tablet (40 mg total) by mouth 2 (two) times daily. Take 30 minutes before breakfast 180 tablet 3  . polyethylene glycol (MIRALAX / GLYCOLAX) 17 g packet Take 17 g by mouth daily as needed.     No current facility-administered medications for this visit.     Allergies as of 10/06/2018 - Review Complete 09/23/2018  Allergen Reaction Noted  . Chantix [varenicline] Other (See Comments) 07/25/2015  . Penicillins Other (See Comments) 07/26/2011  . Latex Rash 09/18/2011    ROS:  General: Negative for anorexia, weight loss, fever, chills, fatigue, weakness. ENT: Negative for hoarseness, difficulty swallowing , nasal congestion. CV: Negative for chest pain, angina, palpitations, dyspnea on exertion, peripheral edema.  Respiratory: Negative for dyspnea at rest, dyspnea on exertion, cough, sputum, wheezing.  GI: See history of present illness. GU:  Negative for dysuria, hematuria, urinary incontinence, urinary frequency, nocturnal urination.  Endo: Negative for unusual weight change.    Physical Examination:   BP 130/80   Pulse 88  Temp (!) 97.1 F (36.2 C) (Temporal)   Ht 5\' 8"  (1.727 m)   Wt 257 lb 12.8 oz (116.9 kg)   LMP 05/12/2016   BMI 39.20 kg/m   General: Well-nourished, well-developed in no acute distress.  Eyes: No icterus. Mouth: masked Lungs: Clear to auscultation bilaterally.  Heart: Regular rate and rhythm, no murmurs rubs or gallops.  Abdomen: Bowel sounds are normal, nontender, nondistended, no hepatosplenomegaly or masses, no abdominal bruits or hernia , no rebound or guarding.    Extremities: No lower extremity edema. No clubbing or deformities. Neuro: Alert and oriented x 4   Skin: Warm and dry, no jaundice.   Psych: Alert and cooperative, normal mood and affect.  Labs:  Lab Results  Component Value Date   CREATININE 0.78 09/17/2018   BUN 4 (L) 09/17/2018   NA 142 09/17/2018   K 3.9 09/17/2018   CL 101 09/17/2018   CO2 23 09/17/2018   Lab Results  Component Value Date   ALT 13 09/17/2018   AST 11 09/17/2018   ALKPHOS 88 09/17/2018   BILITOT 0.2 09/17/2018   Lab Results  Component Value Date   WBC 13.6 (H) 09/17/2018   HGB 14.6 09/17/2018   HCT 44.2 09/17/2018   MCV 88 09/17/2018   PLT 346 09/17/2018       Imaging Studies: No results found.

## 2018-10-06 NOTE — Assessment & Plan Note (Signed)
Linzess and Amitiza too strong per patient.  Currently using MiraLAX but not regularly.  Encouraged her to take MiraLAX 2-3 doses daily until soft bowel movement, then transition to taking 1 capful daily if no bowel movement in 24 hours.

## 2018-10-09 ENCOUNTER — Ambulatory Visit (HOSPITAL_COMMUNITY)
Admission: RE | Admit: 2018-10-09 | Discharge: 2018-10-09 | Disposition: A | Discharge status: Home / Self Care | Payer: Medicare Other | Source: Ambulatory Visit | Attending: Gastroenterology | Admitting: Gastroenterology

## 2018-10-09 ENCOUNTER — Other Ambulatory Visit: Payer: Self-pay

## 2018-10-09 DIAGNOSIS — R1310 Dysphagia, unspecified: Secondary | ICD-10-CM | POA: Insufficient documentation

## 2018-10-09 DIAGNOSIS — R1319 Other dysphagia: Secondary | ICD-10-CM

## 2018-10-16 ENCOUNTER — Encounter: Payer: Self-pay | Admitting: Internal Medicine

## 2018-10-16 NOTE — Progress Notes (Signed)
Patient scheduled and letter sent  °

## 2018-10-30 DIAGNOSIS — M542 Cervicalgia: Secondary | ICD-10-CM | POA: Diagnosis not present

## 2018-11-19 ENCOUNTER — Other Ambulatory Visit: Payer: Self-pay | Admitting: Pulmonary Disease

## 2018-11-19 DIAGNOSIS — M5136 Other intervertebral disc degeneration, lumbar region: Secondary | ICD-10-CM | POA: Diagnosis not present

## 2018-11-19 DIAGNOSIS — G4734 Idiopathic sleep related nonobstructive alveolar hypoventilation: Secondary | ICD-10-CM

## 2018-11-27 ENCOUNTER — Ambulatory Visit: Payer: Self-pay | Admitting: Gastroenterology

## 2018-12-07 DIAGNOSIS — M5136 Other intervertebral disc degeneration, lumbar region: Secondary | ICD-10-CM | POA: Diagnosis not present

## 2018-12-07 DIAGNOSIS — M5416 Radiculopathy, lumbar region: Secondary | ICD-10-CM | POA: Diagnosis not present

## 2018-12-07 DIAGNOSIS — M961 Postlaminectomy syndrome, not elsewhere classified: Secondary | ICD-10-CM | POA: Diagnosis not present

## 2018-12-25 ENCOUNTER — Encounter: Payer: Self-pay | Admitting: Internal Medicine

## 2018-12-25 ENCOUNTER — Ambulatory Visit (INDEPENDENT_AMBULATORY_CARE_PROVIDER_SITE_OTHER): Payer: Medicare Other | Admitting: Internal Medicine

## 2018-12-25 ENCOUNTER — Other Ambulatory Visit: Payer: Self-pay

## 2018-12-25 VITALS — BP 128/81 | HR 77 | Temp 98.0°F | Ht 69.0 in | Wt 258.8 lb

## 2018-12-25 DIAGNOSIS — K2100 Gastro-esophageal reflux disease with esophagitis, without bleeding: Secondary | ICD-10-CM | POA: Diagnosis not present

## 2018-12-25 DIAGNOSIS — R1310 Dysphagia, unspecified: Secondary | ICD-10-CM

## 2018-12-25 MED ORDER — ESOMEPRAZOLE MAGNESIUM 40 MG PO CPDR: 40.0000 mg | DELAYED_RELEASE_CAPSULE | Freq: Two times a day (BID) | ORAL | 3 refills | Status: DC

## 2018-12-25 NOTE — Patient Instructions (Signed)
Schedule a speech/swallowing evaluation to evaluate oropharyngeal dysphagia (aspiration on prior BPE)  See Dentist about getting dentures to better fit  Stop protonix; begin Nexium 40 mg twice daily;  Disp 60 with 3 refills  GERD information provided  OV with [place] in 3 months
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
## 2018-12-28 ENCOUNTER — Other Ambulatory Visit: Payer: Self-pay | Admitting: Family Medicine

## 2018-12-28 ENCOUNTER — Other Ambulatory Visit: Payer: Self-pay | Admitting: *Deleted

## 2018-12-28 DIAGNOSIS — R1319 Other dysphagia: Secondary | ICD-10-CM

## 2019-01-04 ENCOUNTER — Other Ambulatory Visit: Payer: Self-pay | Admitting: Family Medicine

## 2019-01-05 ENCOUNTER — Telehealth: Payer: Self-pay | Admitting: Internal Medicine

## 2019-01-05 ENCOUNTER — Telehealth: Payer: Self-pay | Admitting: Family Medicine

## 2019-01-05 ENCOUNTER — Other Ambulatory Visit: Payer: Self-pay | Admitting: Family Medicine

## 2019-01-05 MED ORDER — ALPRAZOLAM 0.5 MG PO TABS: 0.7500 mg | ORAL_TABLET | Freq: Every day | ORAL | 0 refills | Status: DC

## 2019-01-05 NOTE — Telephone Encounter (Signed)
Spoke to pt's pharmacy. The Nexium is going to be $1:30. Pt's pharmacy wasn't aware that the pt's Medicaid was active. The pharmacist reran the medication for pt. Pt is aware that she can pick her medication up for $1:30.

## 2019-01-05 NOTE — Telephone Encounter (Signed)
I sent 30 days only. Then contact the patient letting them know that they will need an appointment before any further prescriptions can be sent in. The previous appointment for anxiety was 6 months ago. The acute visit was focused on  A GI complaint. If anxiety was mentioned, I was not evaluated in a manner that would allow refills since it is a controlled substance.

## 2019-01-05 NOTE — Telephone Encounter (Signed)
Patient aware and apt scheduled.

## 2019-01-05 NOTE — Telephone Encounter (Signed)
Pt said she was seen recently and was given a prescription (doesn't know the name) and her insurance doesn't cover. She is asking for something else if possible. She uses [facility] on [address]. [phone]

## 2019-01-05 NOTE — Telephone Encounter (Signed)
Patient was seen 8/12 for an acute visit and states when she was seen they talked about her anxiety- not in office note.  Patient states she only comes every 6 months for her xanax refill- Please advise

## 2019-01-05 NOTE — Telephone Encounter (Signed)
What is the name of the medication? ALPRAZolam (XANAX) 0.5 MG tablet    Have you contacted your pharmacy to request a refill?YES  Which pharmacy would you like this sent to? [facility] [place] on [address], she is completely out of this medication     Patient notified that their request is being sent to the clinical staff for review and that they should receive a call once it is complete. If they do not receive a call within 24 hours they can check with their pharmacy or our office.

## 2019-01-11 ENCOUNTER — Other Ambulatory Visit (HOSPITAL_COMMUNITY): Payer: Self-pay | Admitting: Specialist

## 2019-01-11 DIAGNOSIS — R1312 Dysphagia, oropharyngeal phase: Secondary | ICD-10-CM

## 2019-01-11 DIAGNOSIS — R1313 Dysphagia, pharyngeal phase: Secondary | ICD-10-CM

## 2019-01-11 DIAGNOSIS — R1319 Other dysphagia: Secondary | ICD-10-CM

## 2019-01-11 DIAGNOSIS — K219 Gastro-esophageal reflux disease without esophagitis: Secondary | ICD-10-CM

## 2019-01-14 ENCOUNTER — Other Ambulatory Visit: Payer: Self-pay

## 2019-01-14 ENCOUNTER — Ambulatory Visit (HOSPITAL_COMMUNITY)
Admission: RE | Admit: 2019-01-14 | Discharge: 2019-01-14 | Disposition: A | Discharge status: Home / Self Care | Payer: Medicare Other | Source: Ambulatory Visit | Attending: Internal Medicine | Admitting: Internal Medicine

## 2019-01-14 ENCOUNTER — Encounter (HOSPITAL_COMMUNITY): Payer: Self-pay | Admitting: Speech Pathology

## 2019-01-14 ENCOUNTER — Ambulatory Visit (HOSPITAL_COMMUNITY): Payer: Medicare Other | Attending: Internal Medicine | Admitting: Speech Pathology

## 2019-01-14 DIAGNOSIS — R05 Cough: Secondary | ICD-10-CM | POA: Diagnosis not present

## 2019-01-14 DIAGNOSIS — R1312 Dysphagia, oropharyngeal phase: Secondary | ICD-10-CM | POA: Insufficient documentation

## 2019-01-14 DIAGNOSIS — K219 Gastro-esophageal reflux disease without esophagitis: Secondary | ICD-10-CM | POA: Diagnosis not present

## 2019-01-14 DIAGNOSIS — R1310 Dysphagia, unspecified: Secondary | ICD-10-CM | POA: Diagnosis not present
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
## 2019-01-26 ENCOUNTER — Encounter: Payer: Self-pay | Admitting: Family Medicine

## 2019-01-26 ENCOUNTER — Ambulatory Visit (INDEPENDENT_AMBULATORY_CARE_PROVIDER_SITE_OTHER): Payer: Medicare Other | Admitting: Family Medicine

## 2019-01-26 ENCOUNTER — Other Ambulatory Visit: Payer: Self-pay | Admitting: Family Medicine

## 2019-01-26 DIAGNOSIS — F411 Generalized anxiety disorder: Secondary | ICD-10-CM

## 2019-01-26 DIAGNOSIS — J4520 Mild intermittent asthma, uncomplicated: Secondary | ICD-10-CM | POA: Diagnosis not present

## 2019-01-26 DIAGNOSIS — K2100 Gastro-esophageal reflux disease with esophagitis, without bleeding: Secondary | ICD-10-CM | POA: Diagnosis not present

## 2019-01-26 MED ORDER — ALPRAZOLAM 0.5 MG PO TABS: 0.7500 mg | ORAL_TABLET | Freq: Every day | ORAL | 5 refills | Status: DC

## 2019-01-26 MED ORDER — MELOXICAM 15 MG PO TABS: 15.0000 mg | ORAL_TABLET | Freq: Every day | ORAL | 5 refills | Status: DC

## 2019-01-26 MED ORDER — ESOMEPRAZOLE MAGNESIUM 40 MG PO CPDR: 40.0000 mg | DELAYED_RELEASE_CAPSULE | Freq: Two times a day (BID) | ORAL | 3 refills | Status: DC

## 2019-01-26 MED ORDER — CYCLOBENZAPRINE HCL 10 MG PO TABS: 10.0000 mg | ORAL_TABLET | Freq: Three times a day (TID) | ORAL | 1 refills | Status: DC | PRN

## 2019-01-26 MED ORDER — ARIPIPRAZOLE 20 MG PO TABS: 20.0000 mg | ORAL_TABLET | Freq: Every day | ORAL | 5 refills | Status: DC

## 2019-01-26 NOTE — Progress Notes (Signed)
Subjective:    Patient ID: Shelby [name], female    DOB: 02-Mar-1975, 43 y.o.   MRN: [id number]   HPI: Shelby [name] is a 43 y.o. female presenting for continued anxiety caused by work stress and family. Gives no particulars today.  GAD 7 : Generalized Anxiety Score 01/26/2019 07/10/2018 10/25/2015 05/02/2015  Nervous, Anxious, on Edge 3 3 3 3   Control/stop worrying 2 2 3 3   Worry too much - different things 2 2 3 3   Trouble relaxing 2 1 3 3   Restless 1 1 2 2   Easily annoyed or irritable 2 2 3 3   Afraid - awful might happen 1 0 2 0  Total GAD 7 Score 13 11 19 17   Anxiety Difficulty - Somewhat difficult Very difficult Somewhat difficult      Depression screen [facility] 2/9 09/17/2018 02/06/2018 10/15/2017 08/29/2017 07/15/2017  Decreased Interest 0 0 0 0 0  Down, Depressed, Hopeless 0 0 0 0 0  PHQ - 2 Score 0 0 0 0 0  Altered sleeping 0 - - - -  Tired, decreased energy 0 - - - -  Change in appetite 0 - - - -  Feeling bad or failure about yourself  0 - - - -  Trouble concentrating 0 - - - -  Moving slowly or fidgety/restless 0 - - - -  Suicidal thoughts 0 - - - -  PHQ-9 Score 0 - - - -  Difficult doing work/chores - - - - -  Some recent data might be hidden     Relevant past medical, surgical, family and social history reviewed and updated as indicated.  Interim medical history since our last visit reviewed. Allergies and medications reviewed and updated.  ROS:  Review of Systems  Constitutional: Negative.   HENT: Negative.   Eyes: Negative for visual disturbance.  Respiratory: Negative for shortness of breath.   Cardiovascular: Negative for chest pain.  Gastrointestinal: Negative for abdominal pain.  Musculoskeletal: Negative for arthralgias.  Psychiatric/Behavioral: Positive for agitation. The patient is nervous/anxious.      Social History   Tobacco Use  Smoking Status Current Every Day Smoker  . Packs/day: 1.00  . Years: 25.00  . Pack years: 25.00  . Types:  Cigarettes  Smokeless Tobacco Never Used       Objective:     Wt Readings from Last 3 Encounters:  12/25/18 258 lb 12.8 oz (117.4 kg)  10/06/18 257 lb 12.8 oz (116.9 kg)  09/17/18 256 lb (116.1 kg)     Exam deferred. Pt. Harboring due to COVID 19. Phone visit performed.   Assessment & Plan:   1. GAD (generalized anxiety disorder)   2. Mild intermittent asthma without complication   3. Gastroesophageal reflux disease with esophagitis without hemorrhage     Meds ordered this encounter  Medications  . cyclobenzaprine (FLEXERIL) 10 MG tablet    Sig: Take 1 tablet (10 mg total) by mouth 3 (three) times daily as needed for muscle spasms.    Dispense:  60 tablet    Refill:  1  . ALPRAZolam (XANAX) 0.5 MG tablet    Sig: Take 1.5 tablets (0.75 mg total) by mouth at bedtime.    Dispense:  45 tablet    Refill:  5  . ARIPiprazole (ABILIFY) 20 MG tablet    Sig: Take 1 tablet (20 mg total) by mouth at bedtime. (Needs to be seen before next refill)    Dispense:  30 tablet  Refill:  5  . esomeprazole (NEXIUM) 40 MG capsule    Sig: Take 1 capsule (40 mg total) by mouth 2 (two) times daily before a meal.    Dispense:  60 capsule    Refill:  3  . meloxicam (MOBIC) 15 MG tablet    Sig: Take 1 tablet (15 mg total) by mouth daily. For joint and muscle pain    Dispense:  30 tablet    Refill:  5    No orders of the defined types were placed in this encounter.     Diagnoses and all orders for this visit:  GAD (generalized anxiety disorder)  Mild intermittent asthma without complication  Gastroesophageal reflux disease with esophagitis without hemorrhage  Other orders -     cyclobenzaprine (FLEXERIL) 10 MG tablet; Take 1 tablet (10 mg total) by mouth 3 (three) times daily as needed for muscle spasms. -     ALPRAZolam (XANAX) 0.5 MG tablet; Take 1.5 tablets (0.75 mg total) by mouth at bedtime. -     ARIPiprazole (ABILIFY) 20 MG tablet; Take 1 tablet (20 mg total) by mouth at  bedtime. (Needs to be seen before next refill) -     esomeprazole (NEXIUM) 40 MG capsule; Take 1 capsule (40 mg total) by mouth 2 (two) times daily before a meal. -     meloxicam (MOBIC) 15 MG tablet; Take 1 tablet (15 mg total) by mouth daily. For joint and muscle pain    Virtual Visit via telephone Note  I discussed the limitations, risks, security and privacy concerns of performing an evaluation and management service by telephone and the availability of in person appointments. The patient was identified with two identifiers. Pt.expressed understanding and agreed to proceed. Pt. Is at home. Dr. Livia Snellen is in his office.  Follow Up Instructions:   I discussed the assessment and treatment plan with the patient. The patient was provided an opportunity to ask questions and all were answered. The patient agreed with the plan and demonstrated an understanding of the instructions.   The patient was advised to call back or seek an in-person evaluation if the symptoms worsen or if the condition fails to improve as anticipated.   Total minutes including chart review and phone contact time: 22   Follow up plan: Return in about 6 months (around 07/27/2019), or if symptoms worsen or fail to improve.  Claretta Fraise, MD [place]

## 2019-02-03 ENCOUNTER — Other Ambulatory Visit: Payer: Self-pay | Admitting: Family Medicine

## 2019-02-15 ENCOUNTER — Other Ambulatory Visit: Payer: Self-pay | Admitting: Family Medicine

## 2019-03-15 ENCOUNTER — Telehealth: Payer: Self-pay | Admitting: Family Medicine

## 2019-03-15 NOTE — Telephone Encounter (Signed)
Patient aware.

## 2019-03-15 NOTE — Telephone Encounter (Signed)
Yes, I highly recommend it after review of medications.

## 2019-03-16 DIAGNOSIS — Z23 Encounter for immunization: Secondary | ICD-10-CM | POA: Diagnosis not present

## 2019-03-26 DIAGNOSIS — G894 Chronic pain syndrome: Secondary | ICD-10-CM | POA: Diagnosis not present

## 2019-03-26 DIAGNOSIS — M79669 Pain in unspecified lower leg: Secondary | ICD-10-CM | POA: Diagnosis not present

## 2019-03-26 DIAGNOSIS — M5416 Radiculopathy, lumbar region: Secondary | ICD-10-CM | POA: Diagnosis not present

## 2019-03-30 ENCOUNTER — Other Ambulatory Visit: Payer: Self-pay

## 2019-03-30 ENCOUNTER — Ambulatory Visit (INDEPENDENT_AMBULATORY_CARE_PROVIDER_SITE_OTHER): Payer: Medicare Other | Admitting: Gastroenterology

## 2019-03-30 ENCOUNTER — Encounter: Payer: Self-pay | Admitting: Gastroenterology

## 2019-03-30 VITALS — BP 155/98 | HR 73 | Temp 96.6°F | Ht 67.0 in | Wt 246.4 lb

## 2019-03-30 DIAGNOSIS — K219 Gastro-esophageal reflux disease without esophagitis: Secondary | ICD-10-CM | POA: Diagnosis not present

## 2019-03-30 MED ORDER — ESOMEPRAZOLE MAGNESIUM 40 MG PO CPDR: 40.0000 mg | DELAYED_RELEASE_CAPSULE | Freq: Two times a day (BID) | ORAL | 3 refills | Status: DC

## 2019-03-30 NOTE — Assessment & Plan Note (Signed)
Doing well on Nexium 40mg  BID before meals. Reinforced antireflux measures. Try cutting back to Nexium once daily if tolerated.

## 2019-03-30 NOTE — Patient Instructions (Signed)
1. Continue nexium 40mg  twice daily before a meal. RX sent to your pharmacy.  2. You can try to cut back to once daily on nexium with goal of taking least dose as possible but if you need the second dose to control your symptoms you can continue twice a day. 3. Return to the office in one year or call sooner if needed.

## 2019-03-30 NOTE — Progress Notes (Signed)
Primary Care Physician: Claretta Fraise, MD  Primary Gastroenterologist:  Garfield Cornea, MD   Chief Complaint  Patient presents with  . Gastroesophageal Reflux    HPI: Shelby [name] is a 44 y.o. female here for follow-up.  Seen in November 2020 for dysphagia and GERD.  EGD January 2020: Mild erosive reflux esophagitis, esophagus dilated for history of dysphagia. Persistent symptoms she had a barium pill esophagram August 2020 with no evidence of esophageal abnormality, no stricture.  She had unwitnessed episode of aspiration, cleared with voluntary call.  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  No penetration, aspiration or significant residuals noted throughout the study.  Patient pointed to sternal notch area when swallowing solid textures as location of "sticking", however bolus passed through without incident.  Weight overall stable in the last one year. Eating soft or finely chopped foods still due to poor fitting dentures. Heartburn overall well controlled. BM regular. No melena. Some toilet tissue brbpr at times. H/o hemorrhoids on colonoscopy in 2019. No abdominal pain.   Test phase of nerve stimulator in the back. Had this week.     Current Outpatient Medications  Medication Sig Dispense Refill  . acetaminophen (TYLENOL) 650 MG CR tablet Take 1,300 mg by mouth 2 (two) times daily as needed for pain.    Marland Kitchen albuterol (VENTOLIN HFA) 108 (90 Base) MCG/ACT inhaler INHALE 2 PUFFS INTO THE LUNGS EVERY 6 HOURS AS NEEDED FOR WHEEZING OR SHORTNESS OF BREATH 54 g 1  . ALPRAZolam (XANAX) 0.5 MG tablet Take 1.5 tablets (0.75 mg total) by mouth at bedtime. 45 tablet 5  . ARIPiprazole (ABILIFY) 20 MG tablet Take 1 tablet (20 mg total) by mouth at bedtime. (Needs to be seen before next refill) 30 tablet 5  . cyclobenzaprine (FLEXERIL) 10 MG tablet Take 1 tablet (10 mg  total) by mouth 3 (three) times daily as needed for muscle spasms. (Patient taking differently: Take 10 mg by mouth as needed for muscle spasms. ) 60 tablet 1  . esomeprazole (NEXIUM) 40 MG capsule Take 1 capsule (40 mg total) by mouth 2 (two) times daily before a meal. 60 capsule 3  . fluticasone furoate-vilanterol (BREO ELLIPTA) 100-25 MCG/INH AEPB Inhale 1 puff into the lungs daily.    . hydrOXYzine (ATARAX/VISTARIL) 25 MG tablet Take 1 tablet (25 mg total) by mouth 3 (three) times daily as needed for anxiety. TAKE 1 TABLET(25 MG) BY MOUTH TID DAILY AS NEEDED FOR ANXIETY 90 tablet 5  . meloxicam (MOBIC) 15 MG tablet Take 1 tablet (15 mg total) by mouth daily. For joint and muscle pain 30 tablet 5  . polyethylene glycol (MIRALAX / GLYCOLAX) 17 g packet Take 17 g by mouth daily as needed.     No current facility-administered medications for this visit.    Allergies as of 03/30/2019 - Review Complete 03/30/2019  Allergen Reaction Noted  . Chantix [varenicline] Other (See Comments) 07/25/2015  . Penicillins Other (See Comments) 07/26/2011  . Latex Rash 09/18/2011    ROS:  General: Negative for anorexia, weight loss, fever, chills, fatigue, weakness. ENT: Negative for hoarseness, difficulty swallowing , nasal congestion. CV: Negative for chest pain, angina, palpitations, dyspnea on exertion, peripheral edema.  Respiratory: Negative for dyspnea at rest, dyspnea on exertion, cough, sputum, wheezing.  GI: See history of present illness. GU:  Negative for dysuria, hematuria, urinary incontinence, urinary frequency, nocturnal urination.  Endo:  Negative for unusual weight change.    Physical Examination:   BP (!) 155/98   Pulse 73   Temp (!) 96.6 F (35.9 C) (Temporal)   Ht 5\' 7"  (1.702 m)   Wt 246 lb 6.4 oz (111.8 kg)   LMP 05/12/2016   BMI 38.59 kg/m   General: Well-nourished, well-developed in no acute distress.  Eyes: No icterus. Mouth:masked Lungs: Clear to auscultation  bilaterally.  Heart: Regular rate and rhythm, no murmurs rubs or gallops.  Abdomen: Bowel sounds are normal, nontender, nondistended Extremities: No lower extremity edema. No clubbing or deformities. Neuro: Alert and oriented x 4   Skin: Warm and dry, no jaundice.   Psych: Alert and cooperative, normal mood and affect.  Labs:  Lab Results  Component Value Date   CREATININE 0.78 09/17/2018   BUN 4 (L) 09/17/2018   NA 142 09/17/2018   K 3.9 09/17/2018   CL 101 09/17/2018   CO2 23 09/17/2018   Lab Results  Component Value Date   ALT 13 09/17/2018   AST 11 09/17/2018   ALKPHOS 88 09/17/2018   BILITOT 0.2 09/17/2018   Lab Results  Component Value Date   WBC 13.6 (H) 09/17/2018   HGB 14.6 09/17/2018   HCT 44.2 09/17/2018   MCV 88 09/17/2018   PLT 346 09/17/2018     Imaging Studies: No results found.

## 2019-03-31 NOTE — Progress Notes (Signed)
Cc'ed to pcp °

## 2019-04-09 DIAGNOSIS — M546 Pain in thoracic spine: Secondary | ICD-10-CM | POA: Diagnosis not present

## 2019-04-12 IMAGING — CT CT LUMBAR SPINE WITHOUT CONTRAST
3 of 4 series · 12 of 33 positions shown, 14 images · non-contrast
Comparison: Radiography 03/24/2017. Myelography 01/09/2017. MRI
07/10/2016.

CLINICAL DATA: Low back pain. Right leg numbness. Left hip pain.
Symptoms over the last year. Surgery 1 year ago.

EXAM:
CT LUMBAR SPINE WITHOUT CONTRAST
TECHNIQUE: Multidetector CT imaging of the lumbar spine was performed without
intravenous contrast administration. Multiplanar CT image
reconstructions were also generated.

[Series 3: l-spine 2.00 br40 s3 lspine st · axial · 0.34mm/px · z∈[+1476,+1636]mm · 4 of 122 slices shown, 5 images]
[im 21/122  soft-tissue]
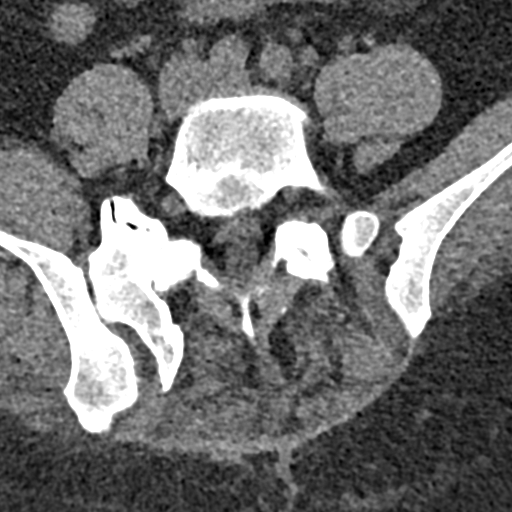
[im 21/122  bone]
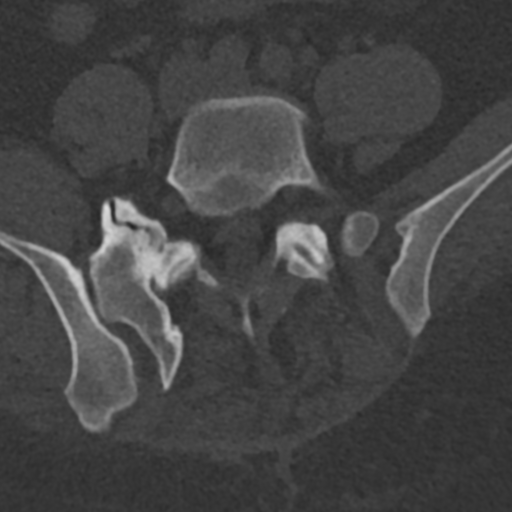
[im 41/122  bone]
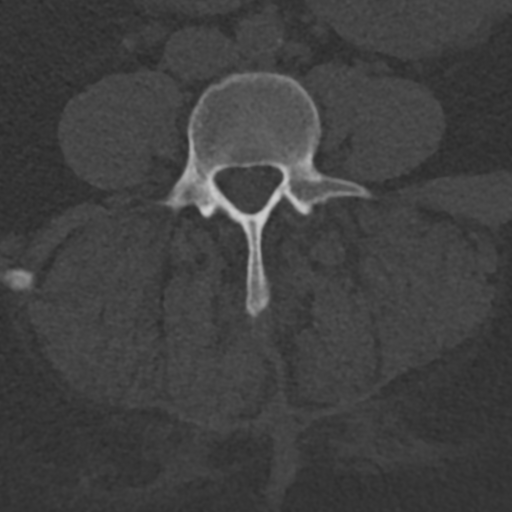
[im 81/122  bone]
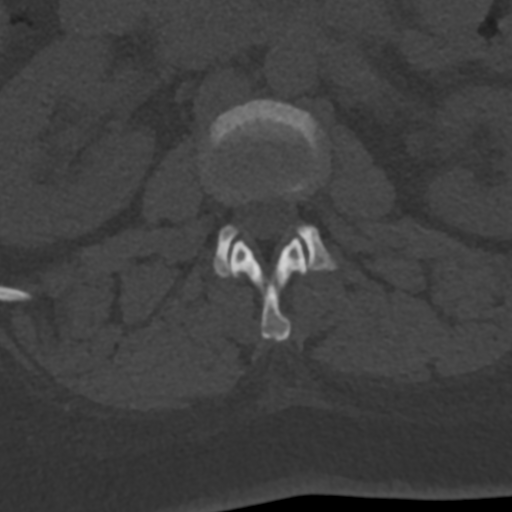
[im 101/122  bone]
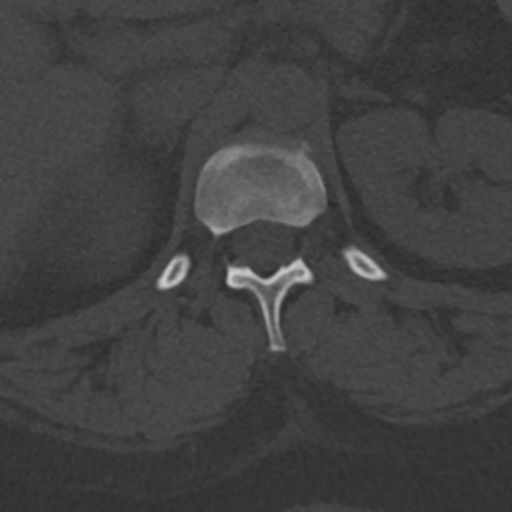

[Series 5: l-spine 2.00 br60 s3 sag sag bone · sagittal · 0.37mm/px · 5 of 86 slices shown, 6 images]
[im 29/86  bone]
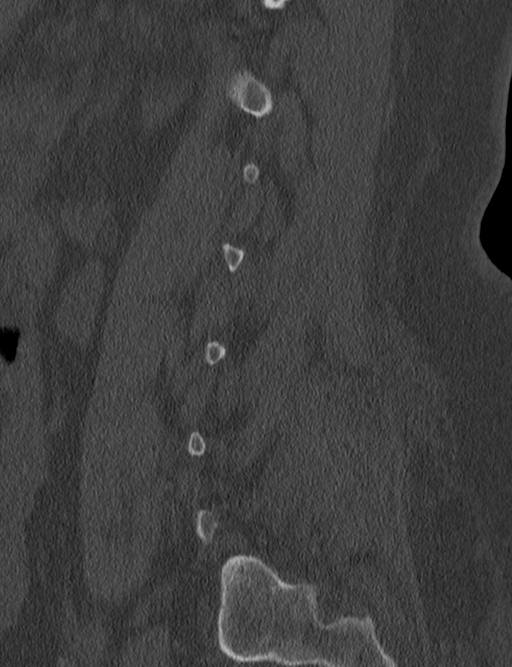
[im 36/86  bone]
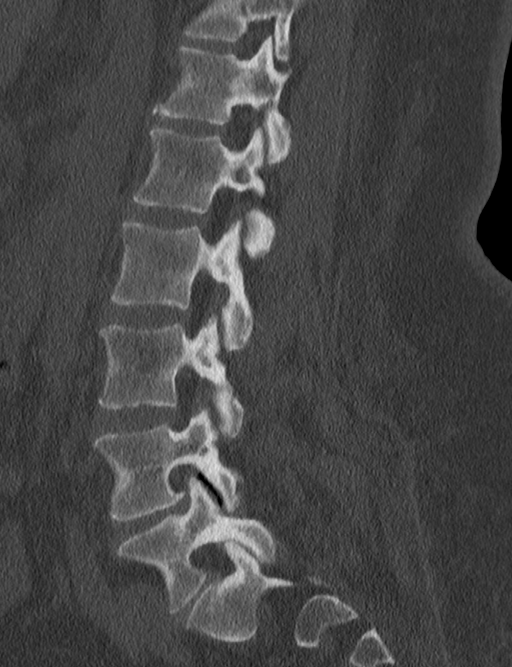
[im 43/86  soft-tissue]
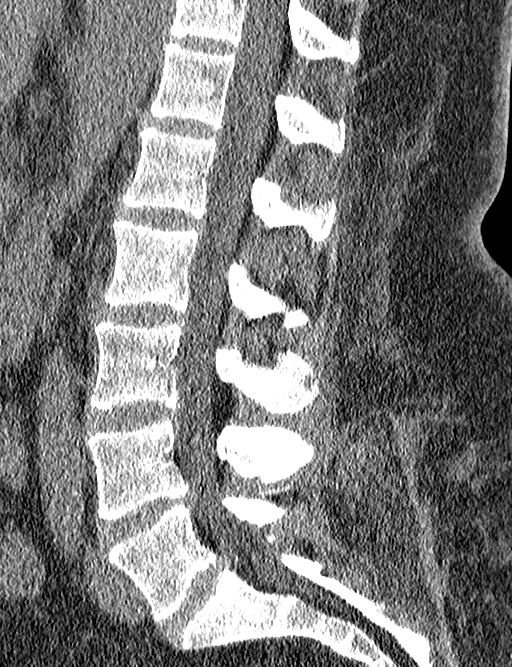
[im 43/86  bone]
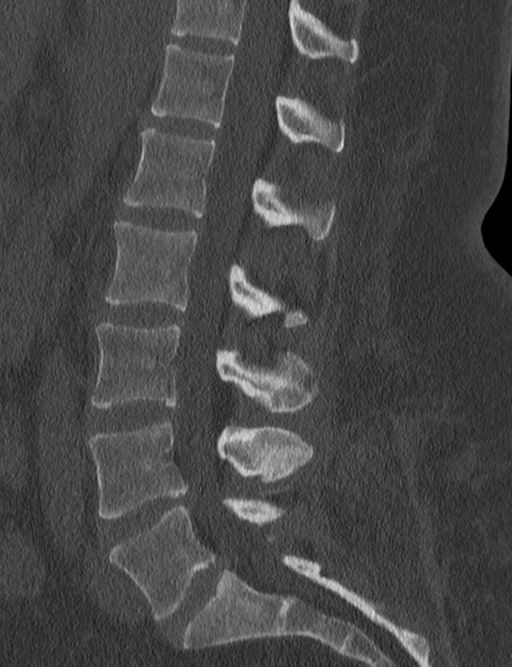
[im 50/86  bone]
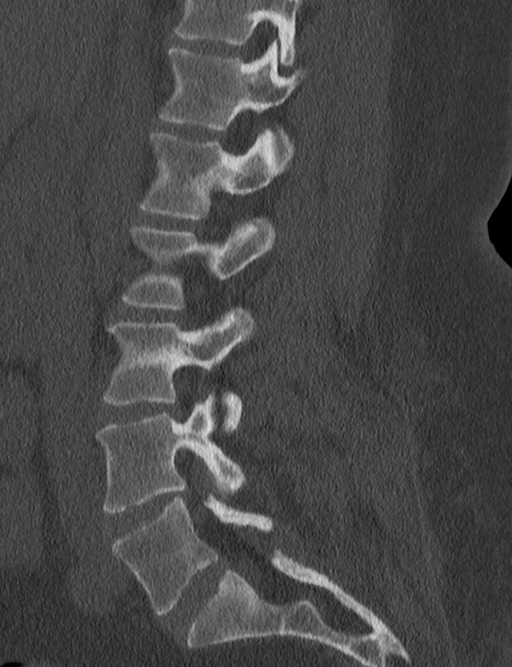
[im 57/86  bone]
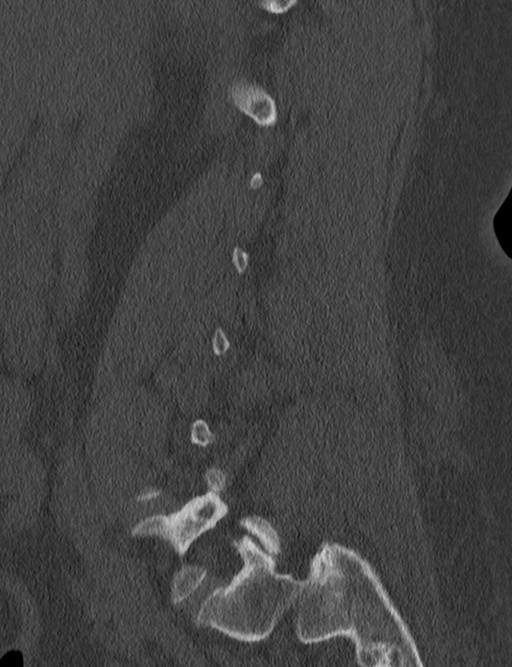

[Series 7: l-spine 2.00 br60 s3 cor cor bone · coronal · 0.34mm/px · 3 of 93 slices shown]
[im 19/93  bone]
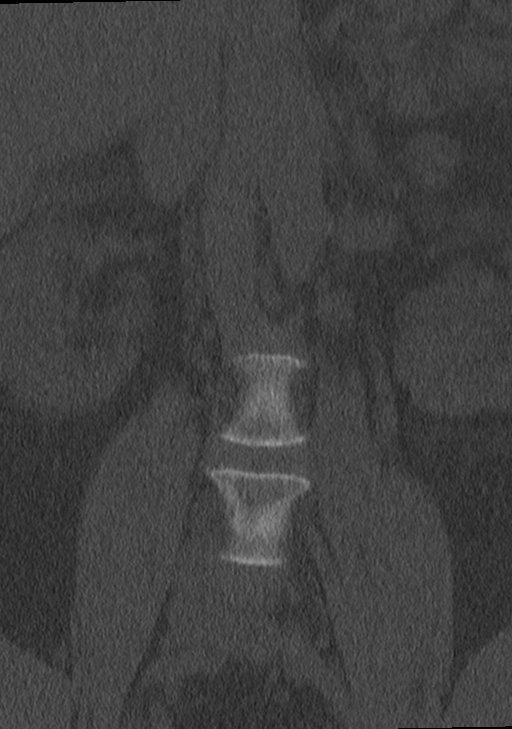
[im 37/93  bone]
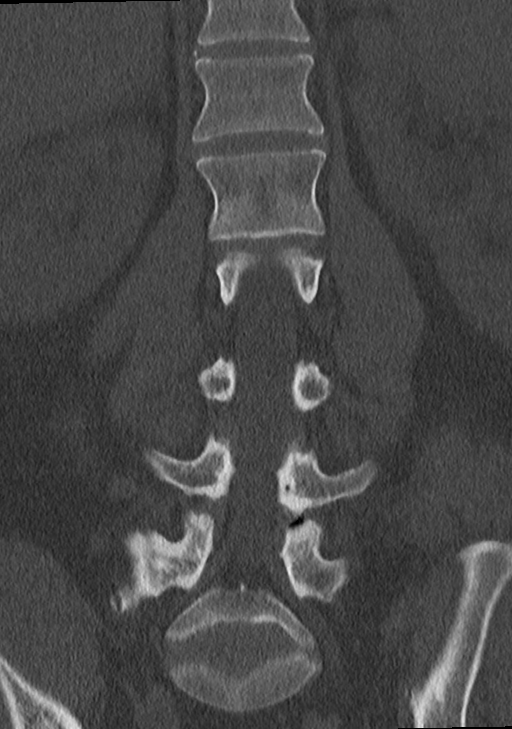
[im 56/93  bone]
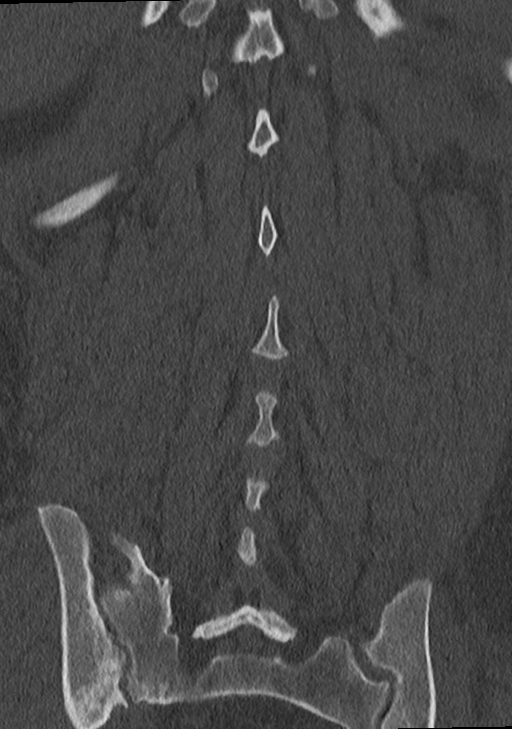

[12 of 33 positions shown; findings below may reference images not displayed]

FINDINGS: Segmentation: Same numbering terminology as was utilized at the
previous myelogram. L5 is a transitional vertebra.

Alignment: 3 mm retrolisthesis L4-5.

Vertebrae: No fracture or primary bone lesion.

Paraspinal and other soft tissues: Negative

Disc levels: T11-12 through L3-4: Normal. No disc degeneration,
bulge or herniation. Wide patency of the canal and foramina.

L4-5: 3 mm retrolisthesis. Shallow left posterolateral disc
herniation similar to the previous myelogram, with left lateral
recess stenosis that could compress the left L5 nerve. Bilateral
facet arthropathy with gaping joints and vacuum phenomenon on the
left. Motion would likely occur at this level with standing flexion
extension.

L5-S1: L5 is a transitional vertebra with anomalous articulation on
the right showing hypertrophic degenerative change that could be
painful. There is mild bulging of the disc but no compressive
stenosis of the canal or foramina.

Congenital asymmetry of the pelvis particularly in the sacroiliac
region on the right with pronounced right-sided sacroiliac
osteoarthritis.
IMPRESSION: L5 is described as a transitional vertebra, consistent with the
numbering terminology at previous myelography.

L4-5: 3 mm retrolisthesis. Shallow disc herniation more prominent
towards the left with left lateral recess stenosis that could
compress the left L5 nerve. Bilateral facet arthropathy at this
level with gaping joints. Abnormal motion would likely occur with
standing and bending. The facet arthropathy appears to have worsened
since the myelogram December 2016.

Anomalous articulation on the right at L5 with pronounced
degenerative changes that would likely be symptomatic. Congenital
asymmetry of the right sacral and iliac anatomy with advanced
degenerative changes of the right SI joint which would likely be
painful.

## 2019-04-13 DIAGNOSIS — Z23 Encounter for immunization: Secondary | ICD-10-CM | POA: Diagnosis not present

## 2019-04-20 DIAGNOSIS — M961 Postlaminectomy syndrome, not elsewhere classified: Secondary | ICD-10-CM | POA: Diagnosis not present

## 2019-04-20 DIAGNOSIS — G8929 Other chronic pain: Secondary | ICD-10-CM | POA: Diagnosis not present

## 2019-04-20 DIAGNOSIS — M545 Low back pain: Secondary | ICD-10-CM | POA: Diagnosis not present

## 2019-04-20 DIAGNOSIS — M546 Pain in thoracic spine: Secondary | ICD-10-CM | POA: Diagnosis not present

## 2019-04-20 DIAGNOSIS — Z6835 Body mass index (BMI) 35.0-35.9, adult: Secondary | ICD-10-CM | POA: Diagnosis not present

## 2019-04-21 ENCOUNTER — Telehealth: Payer: Self-pay | Admitting: Family Medicine

## 2019-04-21 NOTE — Telephone Encounter (Signed)
Please advise 

## 2019-04-21 NOTE — Telephone Encounter (Signed)
We have not received the form. Patient needs appointment to be seen for surgical clearance.

## 2019-04-21 NOTE — Telephone Encounter (Signed)
I'm working remote today. I do not have it with me. Please check my in - basket in my office. WS

## 2019-04-23 NOTE — Telephone Encounter (Signed)
Unable to leave message due to full mailbox.

## 2019-04-27 NOTE — Telephone Encounter (Signed)
Patient has a follow up appointment scheduled for surgical clearance.

## 2019-05-03 ENCOUNTER — Other Ambulatory Visit: Payer: Self-pay | Admitting: Family Medicine

## 2019-05-03 NOTE — Telephone Encounter (Signed)
I do not see on patients medication list. Please advise

## 2019-05-07 ENCOUNTER — Telehealth: Payer: Self-pay | Admitting: Family Medicine

## 2019-05-07 NOTE — Chronic Care Management (AMB) (Signed)
  Chronic Care Management   Note  05/07/2019 Name: Shelby [name] MRN: [id number] DOB: 01-24-1976  Shelby [name] is a 44 y.o. year old female who is a primary care patient of Stacks, Cletus Gash, MD. I reached out to Shelby [name] by phone today in response to a referral sent by Ms. Marzetta Merino Illescas's health plan.     Shelby [name] was given information about Chronic Care Management services today including:  1. CCM service includes personalized support from designated clinical staff supervised by her physician, including individualized plan of care and coordination with other care providers 2. 24/7 contact phone numbers for assistance for urgent and routine care needs. 3. Service will only be billed when office clinical staff spend 20 minutes or more in a month to coordinate care. 4. Only one practitioner may furnish and bill the service in a calendar month. 5. The patient may stop CCM services at any time (effective at the end of the month) by phone call to the office staff. 6. The patient will be responsible for cost sharing (co-pay) of up to 20% of the service fee (after annual deductible is met).  Patient agreed to services and verbal consent obtained.   Follow up plan: Telephone appointment with care management team member scheduled for:12/03/2019   Noreene Larsson, [place], [place], [place] [address] Direct Dial: [phone] [email or web address]

## 2019-05-18 ENCOUNTER — Ambulatory Visit: Payer: Medicare Other | Admitting: Family Medicine

## 2019-05-24 ENCOUNTER — Ambulatory Visit: Payer: Self-pay | Admitting: Orthopedic Surgery

## 2019-06-15 ENCOUNTER — Ambulatory Visit (INDEPENDENT_AMBULATORY_CARE_PROVIDER_SITE_OTHER): Payer: Medicare Other | Admitting: Nurse Practitioner

## 2019-06-15 ENCOUNTER — Encounter: Payer: Self-pay | Admitting: Nurse Practitioner

## 2019-06-15 DIAGNOSIS — F339 Major depressive disorder, recurrent, unspecified: Secondary | ICD-10-CM

## 2019-06-15 MED ORDER — ESCITALOPRAM OXALATE 20 MG PO TABS: 20.0000 mg | ORAL_TABLET | Freq: Every day | ORAL | 5 refills | Status: DC
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
## 2019-07-05 ENCOUNTER — Ambulatory Visit: Payer: Self-pay | Admitting: Orthopedic Surgery
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
## 2019-07-06 ENCOUNTER — Encounter: Payer: Self-pay | Admitting: Nurse Practitioner

## 2019-07-06 ENCOUNTER — Other Ambulatory Visit: Payer: Self-pay

## 2019-07-06 ENCOUNTER — Ambulatory Visit (INDEPENDENT_AMBULATORY_CARE_PROVIDER_SITE_OTHER): Payer: Medicare Other | Admitting: Nurse Practitioner

## 2019-07-06 VITALS — BP 110/73 | HR 65 | Temp 98.2°F | Resp 20 | Ht 67.0 in | Wt 238.0 lb

## 2019-07-06 DIAGNOSIS — F339 Major depressive disorder, recurrent, unspecified: Secondary | ICD-10-CM

## 2019-07-06 NOTE — Patient Instructions (Signed)
Venous Thromboembolism Prevention Venous thromboembolism (VTE) is a condition in which a blood clot (thrombus) develops in the body. A deep vein thrombosis (DVT) is a blood clot that usually occurs in a deep vein in the leg or the pelvis, but it can also occur in the arm. Sometimes, pieces of a thrombus can break off and travel through the bloodstream to other parts of the body. When that happens, the thrombus is called an embolus. An embolus that travels to the lungs is called a pulmonary embolism. An embolism can block the blood flow in the blood vessels of other organs as well. How can this condition affect me? VTE is a serious health condition that can cause disability or death. It is very important to get help right away. Do not ignore your symptoms. What can increase my risk? You are more likely to develop this condition if you:  Have had recent major surgery or trauma.  Have certain health conditions, such as cancer.  Are in the hospital for a sudden illness.  Have not moved for a long period of time, such as if you are on bed rest or traveling a long distance.  Take certain medicines, such as birth control pills or hormone replacement therapy.  Are pregnant or recently gave birth.  Have a personal or family history of VTE.  Are overweight.  Are 14 years of age or older.  Use products that contain nicotine and tobacco. What actions can I take to prevent this? In the hospital A VTE may be prevented by taking medicines that are prescribed to prevent blood clots (anticoagulants). You can also help to prevent VTE while in the hospital by taking these actions:  Get out of bed and walk. Ask your health care provider if this is safe for you to do.  Ask your health care provider if you should use a sequential compression device (SCD). This is a machine that pumps air into compression sleeves that are wrapped around your legs.  Ask your health care provider if you should wear tight,  elastic stockings that apply pressure to the lower legs (compression stockings). Compression stockings are sometimes used with SCDs. At home after surgery Understand that you have an increased risk for VTE for the first 4-6 weeks after surgery. During this time:  Avoid traveling for more than 4 hours. If you must travel after surgery, ask your health care provider about additional preventive actions that you can take.  Avoid sitting or lying still for too long. If possible, get up and walk around one time every hour. Ask your health care provider when this is safe for you to do. While traveling Travel that takes more than 4 hours can increase the risk of a VTE. To prevent VTE when traveling:  Exercise your arms and legs every hour. You can do this by standing, stretching, and bending and straightening your arms and legs. If you are traveling by airplane, train, or bus, walk up and down the aisle as often as possible to get your blood moving. If you are traveling by car, stop and get out of the car every hour to exercise your arms and legs and stretch. Other types of exercise might include: ? Keeping your feet flat on the ground and raising your toes. ? Switching from tightening the muscles in your calves and thighs to relaxing those same muscles while you are sitting. ? Pointing and flexing your feet at the ankle joints while you are sitting.  Drink enough  water to keep your urine pale yellow. °· Avoid drinking alcohol during long travel. °Generally, it is not recommended that you take medicines to prevent DVT during routine travel. °Activity ° °· Stay active. Avoid sitting or lying in bed for long periods of time without moving your arms and legs. °· Exercise by moving your arms and legs for 30 minutes or more every day. °· Try not to bump or injure your legs. °· Avoid crossing your legs when you are sitting. °Lifestyle °· Do not use any products that contain nicotine or tobacco, such as cigarettes,  e-cigarettes, and chewing tobacco. If you need help quitting, ask your health care provider. This is especially important if you take estrogen medicines. °· Avoid wearing tight clothing around your legs or waist. °General information ° °· Wear compression stockings as told by your health care provider. These stockings help to prevent blood clots and reduce swelling in your legs. Do not let them bunch up when you are wearing them. °· Avoid using medicines that contain estrogen if you do not need them. These include birth control pills and hormone replacement therapy. °· Do not use pillows under your knees while lying down unless told by your health care provider. °· Maintain a healthy weight. °Where to find more information °· Centers for Disease Control and Prevention: www.cdc.gov °· American Heart Association: www.heart.org °Get help right away if: °· You have chest pain or shortness of breath. °· You cough up blood. °· You have a rapid or irregular heartbeat. °· You feel light-headed or dizzy. °· You have new or increased pain, swelling, or redness in an arm or leg. °· You have numbness or tingling in an arm or leg. °· You have blood in your vomit, stool, or urine. °These symptoms may represent a serious problem that is an emergency. Do not wait to see if the symptoms will go away. Get medical help right away. Call your local emergency services (911 in the [place] Do not drive yourself to the hospital. °Summary °· Venous thromboembolism (VTE) is a condition in which a blood clot (thrombus)develops in the body. A deep vein thrombosis (DVT) is a blood clot that usually occurs in a deep vein in the leg or the pelvis, but it can also occur in the arm. °· VTE is a serious health condition that can cause disability or death. It is very important to get help right away. Do not ignore your symptoms. °· If you are traveling, exercise your arms and legs every hour. °· Stay active to help prevent blood clots. Avoid sitting or  lying in bed for long periods of time without moving your arms and legs. °This information is not intended to replace advice given to you by your health care provider. Make sure you discuss any questions you have with your health care provider. °Document Revised: 05/19/2018 Document Reviewed: 04/07/2018 °Elsevier Patient Education © 2020 [employer] ° °

## 2019-07-06 NOTE — Progress Notes (Signed)
Subjective:    Patient ID: Shelby [name], female    DOB: 1975-11-20, 44 y.o.   MRN: [id number]   Chief Complaint: Follow up depression   HPI Patient had a telephone visit on 06/15/19 for depression. Her depression score was 20. AT that time he was on abilify 20mg  daily. It had not been working as welll a it did when she first started taking. Wean were having her wean off of abilify and switch to lexapro 20mg . Shee say she feels some better. She has completely stopped the abilify and I now only on lexapro daily. Depression screen [facility] 2/9 07/06/2019 06/15/2019 09/17/2018  Decreased Interest 1 3 0  Down, Depressed, Hopeless 1 3 0  PHQ - 2 Score 2 6 0  Altered sleeping 1 3 0  Tired, decreased energy 3 3 0  Change in appetite 3 2 0  Feeling bad or failure about yourself  0 3 0  Trouble concentrating 1 3 0  Moving slowly or fidgety/restless 0 0 0  Suicidal thoughts 0 0 0  PHQ-9 Score 10 20 0  Difficult doing work/chores Not difficult at all Very difficult -  Some recent data might be hidden   * is scheduled for lumbar surgery on June 2,2021. She also quit smoking yesterday.    Review of Systems  Constitutional: Negative for diaphoresis.  Eyes: Negative for pain.  Respiratory: Negative for shortness of breath.   Cardiovascular: Negative for chest pain, palpitations and leg swelling.  Gastrointestinal: Negative for abdominal pain.  Endocrine: Negative for polydipsia.  Skin: Negative for rash.  Neurological: Negative for dizziness, weakness and headaches.  Hematological: Does not bruise/bleed easily.  All other systems reviewed and are negative.      Objective:   Physical Exam Vitals and nursing note reviewed.  Constitutional:      General: She is not in acute distress.    Appearance: Normal appearance. She is well-developed.  HENT:     Head: Normocephalic.     Nose: Nose normal.  Eyes:     Pupils: Pupils are equal, round, and reactive to light.  Neck:     Vascular: No  carotid bruit or JVD.  Cardiovascular:     Rate and Rhythm: Normal rate and regular rhythm.     Heart sounds: Normal heart sounds.  Pulmonary:     Effort: Pulmonary effort is normal. No respiratory distress.     Breath sounds: Normal breath sounds. No wheezing or rales.  Chest:     Chest wall: No tenderness.  Abdominal:     General: Bowel sounds are normal. There is no distension or abdominal bruit.     Palpations: Abdomen is soft. There is no hepatomegaly, splenomegaly, mass or pulsatile mass.     Tenderness: There is no abdominal tenderness.  Musculoskeletal:        General: Normal range of motion.     Cervical back: Normal range of motion and neck supple.  Lymphadenopathy:     Cervical: No cervical adenopathy.  Skin:    General: Skin is warm and dry.  Neurological:     Mental Status: She is alert and oriented to person, place, and time.     Deep Tendon Reflexes: Reflexes are normal and symmetric.  Psychiatric:        Behavior: Behavior normal.        Thought Content: Thought content normal.        Judgment: Judgment normal.    BP 110/73   Pulse 65  Temp 98.2 F (36.8 C) (Temporal)   Resp 20   Ht 5\' 7"  (1.702 m)   Wt 238 lb (108 kg)   LMP 05/12/2016   SpO2 97%   BMI 37.28 kg/m        Assessment & Plan:  Shelby [name] in today with chief complaint of Follow up depression   1. Depression, recurrent ([place]) Continue stress management continue lexapro daily Good luck with surgery Follow up in 3 months    The above assessment and management plan was discussed with the patient. The patient verbalized understanding of and has agreed to the management plan. Patient is aware to call the clinic if symptoms persist or worsen. Patient is aware when to return to the clinic for a follow-up visit. Patient educated on when it is appropriate to go to the emergency department.   [name] Done, FNP

## 2019-07-09 NOTE — Progress Notes (Signed)
[id number] - [place], [place] [address] [place] [address] Phone: [phone] Fax: [phone]      Your procedure is scheduled on Wednesday 07/14/2019.  Report to [facility] Main Entrance "A" at [place].M., and check in at the Admitting office.  Call this number if you have problems the morning of surgery:  [phone]  Call [phone] if you have any questions prior to your surgery date Monday-Friday 8am-4pm    Remember:  Do not eat or drink after midnight the night before your surgery    Take these medicines the morning of surgery with A SIP OF WATER: Acetaminophen (Tylenol) - if needed Albuterol (Ventolin) inhaler - if needed Cyclopenzaprine (Flexeril) - if needed Duloxetine (Cymbalta) Esomeprazole (Nexium) Hydroxyzine (Atarax/Vistaril) - if needed   As of today, STOP taking any Meloxicam (Mobic), Aspirin (unless otherwise instructed by your surgeon) and Aspirin containing products, Aleve, Naproxen, Ibuprofen, Motrin, Advil, Goody's, BC's, all herbal medications, fish oil, and all vitamins.                      Do not wear jewelry, make up, or nail polish            Do not wear lotions, powders, perfumes, or deodorant.            Do not shave 48 hours prior to surgery.             Do not bring valuables to the hospital.            [facility] is not responsible for any belongings or valuables.  Do NOT Smoke (Tobacco/Vapping) or drink Alcohol 24 hours prior to your procedure  If you use a CPAP at night, you may bring all equipment for your overnight stay.   Contacts, glasses, dentures or bridgework may not be worn into surgery.      For patients admitted to the hospital, discharge time will be determined by your treatment team.   Patients discharged the day of surgery will not be allowed to drive home, and someone needs to stay with them for 24 hours.    Special instructions:    [facility]- Preparing For Surgery  Before surgery, you can play an important role. Because skin is not sterile, your skin needs to be as free of germs as possible. You can reduce the number of germs on your skin by washing with CHG (chlorahexidine gluconate) Soap before surgery.  CHG is an antiseptic cleaner which kills germs and bonds with the skin to continue killing germs even after washing.    Oral Hygiene is also important to reduce your risk of infection.  Remember - BRUSH YOUR TEETH THE MORNING OF SURGERY WITH YOUR REGULAR TOOTHPASTE  Please do not use if you have an allergy to CHG or antibacterial soaps. If your skin becomes reddened/irritated stop using the CHG.  Do not shave (including legs and underarms) for at least 48 hours prior to first CHG shower. It is OK to shave your face.  Please follow these instructions carefully.   1. Shower the NIGHT BEFORE SURGERY and the MORNING OF SURGERY with CHG Soap.   2. If you chose to wash your hair, wash your hair first as usual with your normal shampoo.  3. After you shampoo, rinse your hair and body thoroughly to remove the shampoo.  4. Use CHG as you  would any other liquid soap. You can apply CHG directly to the skin and wash gently with a scrungie or a clean washcloth.   5. Apply the CHG Soap to your body ONLY FROM THE NECK DOWN.  Do not use on open wounds or open sores. Avoid contact with your eyes, ears, mouth and genitals (private parts). Wash Face and genitals (private parts)  with your normal soap.   6. Wash thoroughly, paying special attention to the area where your surgery will be performed.  7. Thoroughly rinse your body with warm water from the neck down.  8. DO NOT shower/wash with your normal soap after using and rinsing off the CHG Soap.  9. Pat yourself dry with a CLEAN TOWEL.  10. Wear CLEAN PAJAMAS to bed the night before surgery, wear comfortable clothes the morning of surgery  11. Place CLEAN SHEETS on your bed  the night of your first shower and DO NOT SLEEP WITH PETS.   Day of Surgery: Shower with CHG soap as directed. Do not apply any deodorants/lotions.  Please wear clean clothes to the hospital/surgery center.   Remember to brush your teeth WITH YOUR REGULAR TOOTHPASTE.   Please read over the following fact sheets that you were given.

## 2019-07-13 ENCOUNTER — Encounter (HOSPITAL_COMMUNITY): Payer: Self-pay

## 2019-07-13 ENCOUNTER — Other Ambulatory Visit: Payer: Self-pay

## 2019-07-13 ENCOUNTER — Other Ambulatory Visit (HOSPITAL_COMMUNITY)
Admission: RE | Admit: 2019-07-13 | Discharge: 2019-07-13 | Disposition: A | Discharge status: Home / Self Care | Payer: Medicare Other | Source: Ambulatory Visit | Attending: Orthopedic Surgery | Admitting: Orthopedic Surgery

## 2019-07-13 ENCOUNTER — Ambulatory Visit (HOSPITAL_COMMUNITY)
Admission: RE | Admit: 2019-07-13 | Discharge: 2019-07-13 | Disposition: A | Discharge status: Home / Self Care | Payer: Medicare Other | Source: Ambulatory Visit | Attending: Orthopedic Surgery | Admitting: Orthopedic Surgery

## 2019-07-13 ENCOUNTER — Encounter (HOSPITAL_COMMUNITY)
Admission: RE | Admit: 2019-07-13 | Discharge: 2019-07-13 | Disposition: A | Discharge status: Home / Self Care | Payer: Medicare Other | Source: Ambulatory Visit | Attending: Orthopedic Surgery | Admitting: Orthopedic Surgery

## 2019-07-13 DIAGNOSIS — Z01818 Encounter for other preprocedural examination: Secondary | ICD-10-CM

## 2019-07-13 DIAGNOSIS — Z20822 Contact with and (suspected) exposure to covid-19: Secondary | ICD-10-CM | POA: Insufficient documentation

## 2019-07-13 DIAGNOSIS — J42 Unspecified chronic bronchitis: Secondary | ICD-10-CM | POA: Diagnosis not present

## 2019-07-13 HISTORY — DX: Anemia, unspecified: D64.9

## 2019-07-13 HISTORY — DX: Sleep apnea, unspecified: G47.30

## 2019-07-13 HISTORY — DX: Pneumonia, unspecified organism: J18.9

## 2019-07-13 LAB — BASIC METABOLIC PANEL
Anion gap: 10 (ref 5–15)
BUN: <5 mg/dL — ABNORMAL LOW (ref 6–20)
CO2: 29 mmol/L (ref 22–32)
Calcium: 9 mg/dL (ref 8.9–10.3)
Chloride: 102 mmol/L (ref 98–111)
Creatinine, Ser: 0.7 mg/dL (ref 0.44–1.00)
GFR calc Af Amer: >60 mL/min (ref >60)
GFR calc non Af Amer: >60 mL/min (ref >60)
Glucose, Bld: 88 mg/dL (ref 70–99)
Potassium: 3.4 mmol/L — ABNORMAL LOW (ref 3.5–5.1)
Sodium: 141 mmol/L (ref 135–145)

## 2019-07-13 LAB — SURGICAL PCR SCREEN
MRSA, PCR: NEGATIVE
Staphylococcus aureus: NEGATIVE

## 2019-07-13 LAB — CBC
HCT: 44.9 % (ref 36.0–46.0)
Hemoglobin: 15.1 g/dL — ABNORMAL HIGH (ref 12.0–15.0)
MCH: 30.1 pg (ref 26.0–34.0)
MCHC: 33.6 g/dL (ref 30.0–36.0)
MCV: 89.6 fL (ref 80.0–100.0)
Platelets: 336 10*3/uL (ref 150–400)
RBC: 5.01 MIL/uL (ref 3.87–5.11)
RDW: 13 % (ref 11.5–15.5)
WBC: 10.7 10*3/uL — ABNORMAL HIGH (ref 4.0–10.5)
nRBC: 0 % (ref 0.0–0.2)

## 2019-07-13 LAB — URINALYSIS, ROUTINE W REFLEX MICROSCOPIC
Bilirubin Urine: NEGATIVE
Glucose, UA: NEGATIVE mg/dL
Ketones, ur: NEGATIVE mg/dL
Leukocytes,Ua: NEGATIVE
Nitrite: NEGATIVE
Protein, ur: NEGATIVE mg/dL
Specific Gravity, Urine: 1.003 — ABNORMAL LOW (ref 1.005–1.030)
pH: 7 (ref 5.0–8.0)

## 2019-07-13 LAB — SARS CORONAVIRUS 2 (TAT 6-24 HRS): SARS Coronavirus 2: NEGATIVE

## 2019-07-13 LAB — APTT: aPTT: 28 seconds (ref 24–36)

## 2019-07-13 LAB — PROTIME-INR
INR: 1 (ref 0.8–1.2)
Prothrombin Time: 13.2 seconds (ref 11.4–15.2)

## 2019-07-13 MED ORDER — VANCOMYCIN HCL 1500 MG/300ML IV SOLN
1500.0000 mg | INTRAVENOUS | Status: AC
  Administered 2019-07-14: 1500 mg via INTRAVENOUS
  Filled 2019-07-13 (×3): qty 300

## 2019-07-13 NOTE — Progress Notes (Signed)
PCP - Dr. Livia Snellen Cardiologist - patient denies  PPM/ICD - n/a Device Orders -  Rep Notified -   Chest x-ray - 07/13/2019 EKG - n/a Stress Test -  Patient denies ECHO - 11/2017 Cardiac Cath - patient denies  Sleep Study - 2019, wears supplemental O2 at night CPAP -   Fasting Blood Sugar - n/a Checks Blood Sugar _____ times a day  Blood Thinner Instructions: n/a Aspirin Instructions: n/a  ERAS Protcol - n/a PRE-SURGERY Ensure or G2-   COVID TEST- 07/13/2019   Anesthesia review: yes, per MD order.  [facility] notified.  Patient denies shortness of breath, fever, cough and chest pain at PAT appointment   All instructions explained to the patient, with a verbal understanding of the material. Patient agrees to go over the instructions while at home for a better understanding. Patient also instructed to self quarantine after being tested for COVID-19. The opportunity to ask questions was provided.
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
## 2019-07-14 ENCOUNTER — Ambulatory Visit (HOSPITAL_COMMUNITY): Payer: Medicare Other

## 2019-07-14 ENCOUNTER — Observation Stay (HOSPITAL_COMMUNITY)
Admission: RE | Admit: 2019-07-14 | Discharge: 2019-07-14 | Disposition: A | Discharge status: Home / Self Care | Payer: Medicare Other | Attending: Orthopedic Surgery | Admitting: Orthopedic Surgery

## 2019-07-14 ENCOUNTER — Ambulatory Visit (HOSPITAL_COMMUNITY): Payer: Medicare Other | Admitting: Physician Assistant

## 2019-07-14 ENCOUNTER — Ambulatory Visit (HOSPITAL_COMMUNITY): Payer: Medicare Other | Admitting: Anesthesiology

## 2019-07-14 ENCOUNTER — Encounter (HOSPITAL_COMMUNITY)
Admission: RE | Disposition: A | Discharge status: Home / Self Care | Payer: Self-pay | Source: Home / Self Care | Attending: Orthopedic Surgery

## 2019-07-14 ENCOUNTER — Encounter (HOSPITAL_COMMUNITY): Payer: Self-pay | Admitting: Orthopedic Surgery

## 2019-07-14 DIAGNOSIS — J45909 Unspecified asthma, uncomplicated: Secondary | ICD-10-CM | POA: Insufficient documentation

## 2019-07-14 DIAGNOSIS — N189 Chronic kidney disease, unspecified: Secondary | ICD-10-CM | POA: Insufficient documentation

## 2019-07-14 DIAGNOSIS — Z6837 Body mass index (BMI) 37.0-37.9, adult: Secondary | ICD-10-CM | POA: Insufficient documentation

## 2019-07-14 DIAGNOSIS — Z9889 Other specified postprocedural states: Secondary | ICD-10-CM | POA: Insufficient documentation

## 2019-07-14 DIAGNOSIS — Z79899 Other long term (current) drug therapy: Secondary | ICD-10-CM | POA: Insufficient documentation

## 2019-07-14 DIAGNOSIS — Z419 Encounter for procedure for purposes other than remedying health state, unspecified: Secondary | ICD-10-CM

## 2019-07-14 DIAGNOSIS — Z969 Presence of functional implant, unspecified: Secondary | ICD-10-CM | POA: Diagnosis not present

## 2019-07-14 DIAGNOSIS — M961 Postlaminectomy syndrome, not elsewhere classified: Secondary | ICD-10-CM | POA: Insufficient documentation

## 2019-07-14 DIAGNOSIS — G8929 Other chronic pain: Secondary | ICD-10-CM | POA: Diagnosis not present

## 2019-07-14 DIAGNOSIS — Z462 Encounter for fitting and adjustment of other devices related to nervous system and special senses: Secondary | ICD-10-CM | POA: Diagnosis not present

## 2019-07-14 DIAGNOSIS — F17210 Nicotine dependence, cigarettes, uncomplicated: Secondary | ICD-10-CM | POA: Insufficient documentation

## 2019-07-14 DIAGNOSIS — K219 Gastro-esophageal reflux disease without esophagitis: Secondary | ICD-10-CM | POA: Diagnosis not present

## 2019-07-14 DIAGNOSIS — F419 Anxiety disorder, unspecified: Secondary | ICD-10-CM | POA: Insufficient documentation

## 2019-07-14 DIAGNOSIS — E669 Obesity, unspecified: Secondary | ICD-10-CM | POA: Insufficient documentation

## 2019-07-14 DIAGNOSIS — Z7951 Long term (current) use of inhaled steroids: Secondary | ICD-10-CM | POA: Insufficient documentation

## 2019-07-14 DIAGNOSIS — F319 Bipolar disorder, unspecified: Secondary | ICD-10-CM | POA: Insufficient documentation

## 2019-07-14 DIAGNOSIS — M1990 Unspecified osteoarthritis, unspecified site: Secondary | ICD-10-CM | POA: Insufficient documentation

## 2019-07-14 DIAGNOSIS — G4733 Obstructive sleep apnea (adult) (pediatric): Secondary | ICD-10-CM | POA: Diagnosis not present

## 2019-07-14 HISTORY — PX: SPINAL CORD STIMULATOR INSERTION: SHX5378

## 2019-07-14 SURGERY — INSERTION, SPINAL CORD STIMULATOR, LUMBAR
Anesthesia: General

## 2019-07-14 MED ORDER — DULOXETINE HCL 30 MG PO CPEP
30.0000 mg | ORAL_CAPSULE | Freq: Every day | ORAL | Status: DC
  Administered 2019-07-14: 30 mg via ORAL
  Filled 2019-07-14: qty 1

## 2019-07-14 MED ORDER — ACETAMINOPHEN 650 MG RE SUPP: 650.0000 mg | RECTAL | Status: DC | PRN

## 2019-07-14 MED ORDER — KETOROLAC TROMETHAMINE 30 MG/ML IJ SOLN
30.0000 mg | Freq: Once | INTRAMUSCULAR | Status: AC | PRN
  Administered 2019-07-14: 30 mg via INTRAVENOUS

## 2019-07-14 MED ORDER — ROCURONIUM BROMIDE 10 MG/ML (PF) SYRINGE
PREFILLED_SYRINGE | INTRAVENOUS | Status: AC
  Filled 2019-07-14: qty 10

## 2019-07-14 MED ORDER — MIDAZOLAM HCL 2 MG/2ML IJ SOLN
INTRAMUSCULAR | Status: AC
  Filled 2019-07-14: qty 2

## 2019-07-14 MED ORDER — METHOCARBAMOL 500 MG PO TABS
ORAL_TABLET | ORAL | Status: AC
  Filled 2019-07-14: qty 1

## 2019-07-14 MED ORDER — ONDANSETRON HCL 4 MG/2ML IJ SOLN
INTRAMUSCULAR | Status: AC
  Filled 2019-07-14: qty 2

## 2019-07-14 MED ORDER — THROMBIN 20000 UNITS EX SOLR
CUTANEOUS | Status: AC
  Filled 2019-07-14: qty 20000

## 2019-07-14 MED ORDER — ACETAMINOPHEN 325 MG PO TABS: 650.0000 mg | ORAL_TABLET | ORAL | Status: DC | PRN

## 2019-07-14 MED ORDER — ONDANSETRON HCL 4 MG/2ML IJ SOLN: 4.0000 mg | Freq: Four times a day (QID) | INTRAMUSCULAR | Status: DC | PRN

## 2019-07-14 MED ORDER — FLUTICASONE FUROATE-VILANTEROL 100-25 MCG/INH IN AEPB
1.0000 | INHALATION_SPRAY | Freq: Every day | RESPIRATORY_TRACT | Status: DC | PRN
  Filled 2019-07-14: qty 28

## 2019-07-14 MED ORDER — OXYCODONE HCL 5 MG PO TABS
ORAL_TABLET | ORAL | Status: AC
  Filled 2019-07-14: qty 1

## 2019-07-14 MED ORDER — GLYCOPYRROLATE PF 0.2 MG/ML IJ SOSY
PREFILLED_SYRINGE | INTRAMUSCULAR | Status: DC | PRN
  Administered 2019-07-14: .1 mg via INTRAVENOUS

## 2019-07-14 MED ORDER — OXYCODONE-ACETAMINOPHEN 10-325 MG PO TABS: 1.0000 | ORAL_TABLET | Freq: Four times a day (QID) | ORAL | 0 refills | Status: AC | PRN

## 2019-07-14 MED ORDER — MIDAZOLAM HCL 5 MG/5ML IJ SOLN
INTRAMUSCULAR | Status: DC | PRN
  Administered 2019-07-14: 2 mg via INTRAVENOUS

## 2019-07-14 MED ORDER — SODIUM CHLORIDE 0.9% FLUSH
3.0000 mL | Freq: Two times a day (BID) | INTRAVENOUS | Status: DC
  Administered 2019-07-14: 3 mL via INTRAVENOUS

## 2019-07-14 MED ORDER — LACTATED RINGERS IV SOLN: INTRAVENOUS | Status: DC

## 2019-07-14 MED ORDER — POLYETHYLENE GLYCOL 3350 17 G PO PACK: 17.0000 g | PACK | Freq: Every day | ORAL | Status: DC | PRN

## 2019-07-14 MED ORDER — TRANEXAMIC ACID-NACL 1000-0.7 MG/100ML-% IV SOLN
INTRAVENOUS | Status: AC
  Filled 2019-07-14: qty 100

## 2019-07-14 MED ORDER — BUPIVACAINE-EPINEPHRINE 0.25% -1:200000 IJ SOLN
INTRAMUSCULAR | Status: DC | PRN
  Administered 2019-07-14: 20 mL

## 2019-07-14 MED ORDER — PROPOFOL 10 MG/ML IV BOLUS
INTRAVENOUS | Status: DC | PRN
  Administered 2019-07-14: 200 mg via INTRAVENOUS

## 2019-07-14 MED ORDER — ONDANSETRON HCL 4 MG PO TABS: 4.0000 mg | ORAL_TABLET | Freq: Three times a day (TID) | ORAL | 0 refills | Status: DC | PRN

## 2019-07-14 MED ORDER — DEXAMETHASONE SODIUM PHOSPHATE 10 MG/ML IJ SOLN
INTRAMUSCULAR | Status: AC
  Filled 2019-07-14: qty 1

## 2019-07-14 MED ORDER — ACETAMINOPHEN 500 MG PO TABS
1000.0000 mg | ORAL_TABLET | Freq: Once | ORAL | Status: AC
  Administered 2019-07-14: 1000 mg via ORAL
  Filled 2019-07-14: qty 2

## 2019-07-14 MED ORDER — PHENOL 1.4 % MT LIQD: 1.0000 | OROMUCOSAL | Status: DC | PRN

## 2019-07-14 MED ORDER — OXYCODONE HCL 5 MG PO TABS
10.0000 mg | ORAL_TABLET | ORAL | Status: DC | PRN
  Administered 2019-07-14: 10 mg via ORAL
  Filled 2019-07-14: qty 2

## 2019-07-14 MED ORDER — OXYCODONE HCL 5 MG PO TABS
5.0000 mg | ORAL_TABLET | Freq: Once | ORAL | Status: AC | PRN
  Administered 2019-07-14: 5 mg via ORAL

## 2019-07-14 MED ORDER — LIDOCAINE 2% (20 MG/ML) 5 ML SYRINGE
INTRAMUSCULAR | Status: AC
  Filled 2019-07-14: qty 5

## 2019-07-14 MED ORDER — PROMETHAZINE HCL 25 MG/ML IJ SOLN: 6.2500 mg | INTRAMUSCULAR | Status: DC | PRN

## 2019-07-14 MED ORDER — SUGAMMADEX SODIUM 200 MG/2ML IV SOLN
INTRAVENOUS | Status: DC | PRN
  Administered 2019-07-14: 200 mg via INTRAVENOUS

## 2019-07-14 MED ORDER — METHOCARBAMOL 500 MG PO TABS: 500.0000 mg | ORAL_TABLET | Freq: Three times a day (TID) | ORAL | 0 refills | Status: AC | PRN

## 2019-07-14 MED ORDER — METHOCARBAMOL 1000 MG/10ML IJ SOLN
500.0000 mg | Freq: Four times a day (QID) | INTRAVENOUS | Status: DC | PRN
  Filled 2019-07-14: qty 5

## 2019-07-14 MED ORDER — SODIUM CHLORIDE 0.9% FLUSH: 3.0000 mL | INTRAVENOUS | Status: DC | PRN

## 2019-07-14 MED ORDER — ONDANSETRON HCL 4 MG/2ML IJ SOLN
INTRAMUSCULAR | Status: DC | PRN
  Administered 2019-07-14: 4 mg via INTRAVENOUS

## 2019-07-14 MED ORDER — HYDROMORPHONE HCL 1 MG/ML IJ SOLN: 0.2500 mg | INTRAMUSCULAR | Status: DC | PRN

## 2019-07-14 MED ORDER — DEXMEDETOMIDINE HCL 200 MCG/2ML IV SOLN
INTRAVENOUS | Status: DC | PRN
  Administered 2019-07-14: 8 ug via INTRAVENOUS

## 2019-07-14 MED ORDER — CHLORHEXIDINE GLUCONATE 0.12 % MT SOLN
15.0000 mL | Freq: Once | OROMUCOSAL | Status: AC
  Administered 2019-07-14: 15 mL via OROMUCOSAL
  Filled 2019-07-14: qty 15

## 2019-07-14 MED ORDER — EPINEPHRINE PF 1 MG/ML IJ SOLN
INTRAMUSCULAR | Status: AC
  Filled 2019-07-14: qty 1

## 2019-07-14 MED ORDER — PHENYLEPHRINE HCL (PRESSORS) 10 MG/ML IV SOLN: INTRAVENOUS | Status: DC | PRN

## 2019-07-14 MED ORDER — KETOROLAC TROMETHAMINE 30 MG/ML IJ SOLN
INTRAMUSCULAR | Status: AC
  Filled 2019-07-14: qty 1

## 2019-07-14 MED ORDER — SCOPOLAMINE 1 MG/3DAYS TD PT72
1.0000 | MEDICATED_PATCH | TRANSDERMAL | Status: DC
  Administered 2019-07-14: 1.5 mg via TRANSDERMAL
  Filled 2019-07-14: qty 1

## 2019-07-14 MED ORDER — LIDOCAINE 2% (20 MG/ML) 5 ML SYRINGE
INTRAMUSCULAR | Status: DC | PRN
  Administered 2019-07-14: 60 mg via INTRAVENOUS

## 2019-07-14 MED ORDER — ALPRAZOLAM 0.5 MG PO TABS: 0.7500 mg | ORAL_TABLET | Freq: Every day | ORAL | Status: DC

## 2019-07-14 MED ORDER — PHENYLEPHRINE 40 MCG/ML (10ML) SYRINGE FOR IV PUSH (FOR BLOOD PRESSURE SUPPORT)
PREFILLED_SYRINGE | INTRAVENOUS | Status: DC | PRN
  Administered 2019-07-14: 80 ug via INTRAVENOUS

## 2019-07-14 MED ORDER — VANCOMYCIN HCL 1500 MG/300ML IV SOLN
1500.0000 mg | Freq: Once | INTRAVENOUS | Status: DC
  Filled 2019-07-14: qty 300

## 2019-07-14 MED ORDER — ORAL CARE MOUTH RINSE: 15.0000 mL | Freq: Once | OROMUCOSAL | Status: AC

## 2019-07-14 MED ORDER — DEXAMETHASONE SODIUM PHOSPHATE 10 MG/ML IJ SOLN
INTRAMUSCULAR | Status: DC | PRN
  Administered 2019-07-14: 4 mg via INTRAVENOUS

## 2019-07-14 MED ORDER — ALBUTEROL SULFATE (2.5 MG/3ML) 0.083% IN NEBU: 3.0000 mL | INHALATION_SOLUTION | Freq: Four times a day (QID) | RESPIRATORY_TRACT | Status: DC | PRN

## 2019-07-14 MED ORDER — BISACODYL 10 MG RE SUPP: 10.0000 mg | Freq: Every day | RECTAL | Status: DC | PRN

## 2019-07-14 MED ORDER — MENTHOL 3 MG MT LOZG: 1.0000 | LOZENGE | OROMUCOSAL | Status: DC | PRN

## 2019-07-14 MED ORDER — ROCURONIUM BROMIDE 10 MG/ML (PF) SYRINGE
PREFILLED_SYRINGE | INTRAVENOUS | Status: DC | PRN
  Administered 2019-07-14: 10 mg via INTRAVENOUS
  Administered 2019-07-14: 60 mg via INTRAVENOUS
  Administered 2019-07-14: 10 mg via INTRAVENOUS

## 2019-07-14 MED ORDER — OXYCODONE HCL 5 MG/5ML PO SOLN: 5.0000 mg | Freq: Once | ORAL | Status: AC | PRN

## 2019-07-14 MED ORDER — OXYCODONE HCL 5 MG PO TABS: 5.0000 mg | ORAL_TABLET | ORAL | Status: DC | PRN

## 2019-07-14 MED ORDER — PROPOFOL 10 MG/ML IV BOLUS
INTRAVENOUS | Status: AC
  Filled 2019-07-14: qty 20

## 2019-07-14 MED ORDER — TRANEXAMIC ACID-NACL 1000-0.7 MG/100ML-% IV SOLN
INTRAVENOUS | Status: DC | PRN
  Administered 2019-07-14: 1000 mg via INTRAVENOUS

## 2019-07-14 MED ORDER — 0.9 % SODIUM CHLORIDE (POUR BTL) OPTIME
TOPICAL | Status: DC | PRN
  Administered 2019-07-14 (×2): 1000 mL

## 2019-07-14 MED ORDER — FENTANYL CITRATE (PF) 250 MCG/5ML IJ SOLN
INTRAMUSCULAR | Status: AC
  Filled 2019-07-14: qty 5

## 2019-07-14 MED ORDER — METHOCARBAMOL 500 MG PO TABS
500.0000 mg | ORAL_TABLET | Freq: Four times a day (QID) | ORAL | Status: DC | PRN
  Administered 2019-07-14: 500 mg via ORAL

## 2019-07-14 MED ORDER — HEMOSTATIC AGENTS (NO CHARGE) OPTIME
TOPICAL | Status: DC | PRN
  Administered 2019-07-14 (×2): 1 via TOPICAL

## 2019-07-14 MED ORDER — MEPERIDINE HCL 25 MG/ML IJ SOLN: 6.2500 mg | INTRAMUSCULAR | Status: DC | PRN

## 2019-07-14 MED ORDER — ONDANSETRON HCL 4 MG PO TABS: 4.0000 mg | ORAL_TABLET | Freq: Four times a day (QID) | ORAL | Status: DC | PRN

## 2019-07-14 MED ORDER — DOCUSATE SODIUM 100 MG PO CAPS
100.0000 mg | ORAL_CAPSULE | Freq: Two times a day (BID) | ORAL | Status: DC
  Administered 2019-07-14: 100 mg via ORAL

## 2019-07-14 MED ORDER — ARIPIPRAZOLE 10 MG PO TABS
20.0000 mg | ORAL_TABLET | Freq: Every day | ORAL | Status: DC
  Filled 2019-07-14: qty 2

## 2019-07-14 MED ORDER — BUPIVACAINE HCL (PF) 0.25 % IJ SOLN
INTRAMUSCULAR | Status: AC
  Filled 2019-07-14: qty 30

## 2019-07-14 MED ORDER — FENTANYL CITRATE (PF) 250 MCG/5ML IJ SOLN
INTRAMUSCULAR | Status: DC | PRN
  Administered 2019-07-14 (×2): 25 ug via INTRAVENOUS
  Administered 2019-07-14: 100 ug via INTRAVENOUS
  Administered 2019-07-14: 25 ug via INTRAVENOUS

## 2019-07-14 SURGICAL SUPPLY — 58 items
CANISTER SUCT 3000ML PPV (MISCELLANEOUS) ×3 IMPLANT
CLOSURE STERI-STRIP 1/2X4 (GAUZE/BANDAGES/DRESSINGS) ×1
CLSR STERI-STRIP ANTIMIC 1/2X4 (GAUZE/BANDAGES/DRESSINGS) ×2 IMPLANT
COVER MAYO STAND STRL (DRAPES) ×3 IMPLANT
COVER PROBE W GEL 5X96 (DRAPES) IMPLANT
COVER SURGICAL LIGHT HANDLE (MISCELLANEOUS) ×3 IMPLANT
COVER WAND RF STERILE (DRAPES) ×3 IMPLANT
DRAPE C-ARM 42X72 X-RAY (DRAPES) ×3 IMPLANT
DRAPE INCISE IOBAN 85X60 (DRAPES) ×3 IMPLANT
DRAPE SURG 17X23 STRL (DRAPES) ×3 IMPLANT
DRAPE U-SHAPE 47X51 STRL (DRAPES) ×3 IMPLANT
DRSG OPSITE POSTOP 4X6 (GAUZE/BANDAGES/DRESSINGS) ×3 IMPLANT
DURAPREP 26ML APPLICATOR (WOUND CARE) ×3 IMPLANT
ELECT BLADE 4.0 EZ CLEAN MEGAD (MISCELLANEOUS)
ELECT CAUTERY BLADE 6.4 (BLADE) ×3 IMPLANT
ELECT PENCIL ROCKER SW 15FT (MISCELLANEOUS) ×3 IMPLANT
ELECT REM PT RETURN 9FT ADLT (ELECTROSURGICAL) ×3
ELECTRODE BLDE 4.0 EZ CLN MEGD (MISCELLANEOUS) IMPLANT
ELECTRODE REM PT RTRN 9FT ADLT (ELECTROSURGICAL) ×1 IMPLANT
GENERATOR PULSE PROCLAIM 5ELIT (Neuro Prosthesis/Implant) IMPLANT
GLOVE BIO SURGEON STRL SZ7 (GLOVE) ×3 IMPLANT
GLOVE BIOGEL PI IND STRL 7.0 (GLOVE) ×1 IMPLANT
GLOVE BIOGEL PI IND STRL 8.5 (GLOVE) ×1 IMPLANT
GLOVE BIOGEL PI INDICATOR 7.0 (GLOVE) ×2
GLOVE BIOGEL PI INDICATOR 8.5 (GLOVE) ×2
GOWN STRL REUS W/ TWL LRG LVL3 (GOWN DISPOSABLE) ×2 IMPLANT
GOWN STRL REUS W/TWL 2XL LVL3 (GOWN DISPOSABLE) ×3 IMPLANT
GOWN STRL REUS W/TWL LRG LVL3 (GOWN DISPOSABLE) ×6
KIT BASIN OR (CUSTOM PROCEDURE TRAY) ×3 IMPLANT
KIT TURNOVER KIT B (KITS) ×3 IMPLANT
LEAD LAMITRODE 60CM 8CH (Lead) ×4 IMPLANT
NDL SPNL 18GX3.5 QUINCKE PK (NEEDLE) ×1 IMPLANT
NEEDLE 22X1 1/2 (OR ONLY) (NEEDLE) ×3 IMPLANT
NEEDLE SPNL 18GX3.5 QUINCKE PK (NEEDLE) ×3 IMPLANT
NS IRRIG 1000ML POUR BTL (IV SOLUTION) ×3 IMPLANT
PACK LAMINECTOMY ORTHO (CUSTOM PROCEDURE TRAY) ×3 IMPLANT
PACK UNIVERSAL I (CUSTOM PROCEDURE TRAY) ×3 IMPLANT
PAD ARMBOARD 7.5X6 YLW CONV (MISCELLANEOUS) ×6 IMPLANT
PROGRAMMER DBS W/MAGNET NMRI (MISCELLANEOUS) ×2 IMPLANT
PULSE GENERATOR PROCLAIM 5ELIT (Neuro Prosthesis/Implant) ×3 IMPLANT
SPATULA SILICONE BRAIN 10MM (MISCELLANEOUS) IMPLANT
SPONGE LAP 4X18 RFD (DISPOSABLE) ×6 IMPLANT
SPONGE SURGIFOAM ABS GEL 100 (HEMOSTASIS) ×3 IMPLANT
STAPLER VISISTAT 35W (STAPLE) ×3 IMPLANT
SURGIFLO W/THROMBIN 8M KIT (HEMOSTASIS) ×3 IMPLANT
SUT BONE WAX W31G (SUTURE) ×3 IMPLANT
SUT ETHIBOND 2 OS 4 DA (SUTURE) ×3 IMPLANT
SUT MNCRL AB 3-0 PS2 18 (SUTURE) ×6 IMPLANT
SUT VIC AB 1 CT1 18XCR BRD 8 (SUTURE) ×2 IMPLANT
SUT VIC AB 1 CT1 8-18 (SUTURE) ×6
SUT VIC AB 2-0 CT1 18 (SUTURE) ×3 IMPLANT
SYR BULB IRRIG 60ML STRL (SYRINGE) ×3 IMPLANT
SYR CONTROL 10ML LL (SYRINGE) ×3 IMPLANT
TOOL TUNNELING 20 (MISCELLANEOUS) ×2 IMPLANT
TOWEL GREEN STERILE (TOWEL DISPOSABLE) ×3 IMPLANT
TOWEL GREEN STERILE FF (TOWEL DISPOSABLE) ×3 IMPLANT
WATER STERILE IRR 1000ML POUR (IV SOLUTION) ×3 IMPLANT
YANKAUER SUCT BULB TIP NO VENT (SUCTIONS) ×3 IMPLANT

## 2019-07-14 NOTE — Discharge Instructions (Signed)
 Spinal Cord Stimulator Care After This sheet gives you information about how to care for yourself after your procedure. Your health care provider may also give you more specific instructions. If you have problems or questions, contact your health care provider. What can I expect after the procedure? After the procedure, it is common to have:  Soreness or pain.  Some swelling in the area where the hardware was removed.  A small amount of blood or clear fluid coming from your incision. Follow these instructions at home: If you have a cast:  Do not stick anything inside the cast to scratch your skin. Doing that increases your risk of infection.  Check the skin around the cast every day. Tell your health care provider about any concerns.  You may put lotion on dry skin around the edges of the cast. Do not put lotion on the skin underneath the cast.  Keep the cast clean and dry. Do not take baths, swim, or use a hot tub until your health care provider approves. Ask your health care provider if you may take showers. You may only be allowed to take sponge baths. 1. Keep the bandage (dressing) dry until your health care provider says it can be removed. 2. Ok to shower in 5 days.    Incision care  1. Follow instructions from your health care provider about how to take care of your incision. Make sure you: ? Wash your hands with soap and water before you change your dressing. If soap and water are not available, use hand sanitizer. ? Change your dressing as told by your health care provider. ? Leave stitches (sutures), skin glue, or adhesive strips in place. These skin closures may need to stay in place for 2 weeks or longer. If adhesive strip edges start to loosen and curl up, you may trim the loose edges. Do not remove adhesive strips completely unless your health care provider tells you to do that. 2. Check your incision area every day for signs of infection. Check for: ? Redness. ? More  swelling or pain. ? More fluid or blood. ? Warmth. ? Pus or a bad smell. Managing pain, stiffness, and swelling  1. If directed, put ice on the affected area: ? Put ice in a plastic bag. ? Place a towel between your skin and the bag. ? Leave the ice on for 20 minutes, 2-3 times a day. 2. Move your fingers or toes often to avoid stiffness and to lessen swelling.  Driving  Do not drive or use heavy machinery while taking prescription pain medicine.  Do not drive for 24 hours if you were given a medicine to help you relax (sedative) during your procedure.  Ask your health care provider when it is safe to drive if you have a cast, splint, or boot on the affected limb. Activity  Ask your health care provider what activities are safe for you during recovery, and ask what activities you need to avoid.  Do not use the injured limb to support your body weight until your health care provider says that you can.  Do not play contact sports until your health care provider approves.  Do exercises as told by your health care provider.  Avoid sitting for a long time without moving. Get up and move around at least every few hours. This will help prevent blood clots. General instructions 1. Do not put pressure on any part of the cast or splint until it is fully hardened. This   may take several hours. 2. If you are taking prescription pain medicine, take actions to prevent or treat constipation. Your health care provider may recommend that you: ? Drink enough fluid to keep your urine pale yellow. ? Eat foods that are high in fiber, such as fresh fruits and vegetables, whole grains, and beans. ? Limit foods that are high in fat and processed sugars, such as fried or sweet foods. ? Take an over-the-counter or prescription medicine for constipation. 3. Do not use any products that contain nicotine or tobacco, such as cigarettes and e-cigarettes. These can delay bone healing after surgery. If you need  help quitting, ask your health care provider. 4. Take over-the-counter and prescription medicines only as told by your health care provider. 5. Keep all follow-up visits as told by your health care provider. This is important. Contact a health care provider if:  You have lasting pain.  You have redness around your incision.  You have more swelling or pain around your incision.  You have more fluid or blood coming from your incision.  Your incision feels warm to the touch.  You have pus or a bad smell coming from your incision.  You are unable to do exercises or physical activity as told by your health care provider. Get help right away if:  You have difficulty breathing.  You have chest pain.  You have severe pain.  You have a fever or chills.  You have numbness for more than 24 hours in the area where the hardware was removed. Summary  After the procedure, it is common to have some pain and swelling in the area where the hardware was removed.  Follow instructions from your health care provider about how to take care of your incision.  Return to your normal activities as told by your health care provider. Ask your health care provider what activities are safe for you. This information is not intended to replace advice given to you by your health care provider. Make sure you discuss any questions you have with your health care provider. 

## 2019-07-14 NOTE — Progress Notes (Signed)
Pharmacy Antibiotic Note  Shelby [name] is a 44 y.o. female admitted on 07/14/2019 with surgical prophylaxis.  Pharmacy has been consulted for vancomycin dosing.  Vancomycin 1500 mg IV x 1 administered preoperatively this morning at 0752 Brief Op Note states no drains  Plan: Vancomycin 1500 mg IV x 1 today at 2000  Height: 5\' 7"  (170.2 cm) Weight: 108.9 kg (240 lb) IBW/kg (Calculated) : 61.6  Temp (24hrs), Avg:97.9 F (36.6 C), Min:97.6 F (36.4 C), Max:98.3 F (36.8 C)  Recent Labs  Lab 07/13/19 1033  WBC 10.7*  CREATININE 0.70    Estimated Creatinine Clearance: 115.2 mL/min (by C-G formula based on SCr of 0.7 mg/dL).    Allergies  Allergen Reactions  . Chantix [Varenicline] Other (See Comments)    Nightmares   . Penicillins Other (See Comments)    Unknown Has patient had a PCN reaction causing immediate rash, facial/tongue/throat swelling, SOB or lightheadedness with hypotension: Unknown Has patient had a PCN reaction causing severe rash involving mucus membranes or skin necrosis: Unknown Has patient had a PCN reaction that required hospitalization: Unknown Has patient had a PCN reaction occurring within the last 10 years: No If all of the above answers are "NO", then may proceed with Cephalosporin use.    . Latex Rash      Thank you for allowing pharmacy to be a part of this patient's care.  Efraim Kaufmann, PharmD, BCPS 07/14/2019 12:55 PM

## 2019-07-14 NOTE — Anesthesia Procedure Notes (Signed)
Procedure Name: Intubation Performed by: Oletta Buehring H, CRNA Pre-anesthesia Checklist: Patient identified, Emergency Drugs available, Suction available and Patient being monitored Patient Re-evaluated:Patient Re-evaluated prior to induction Oxygen Delivery Method: Circle System Utilized Preoxygenation: Pre-oxygenation with 100% oxygen Induction Type: IV induction Ventilation: Mask ventilation without difficulty Laryngoscope Size: Miller and 2 Grade View: Grade I Tube type: Oral Tube size: 7.5 mm Number of attempts: 1 Airway Equipment and Method: Stylet and Oral airway Placement Confirmation: ETT inserted through vocal cords under direct vision,  positive ETCO2 and breath sounds checked- equal and bilateral Secured at: 21 cm Tube secured with: Tape Dental Injury: Teeth and Oropharynx as per pre-operative assessment        

## 2019-07-14 NOTE — Transfer of Care (Signed)
Immediate Anesthesia Transfer of Care Note  Patient: Shelby [name]  Procedure(s) Performed: LUMBAR SPINAL CORD STIMULATOR INSERTION (N/A )  Patient Location: PACU  Anesthesia Type:General  Level of Consciousness: drowsy  Airway & Oxygen Therapy: Patient Spontanous Breathing and Patient connected to nasal cannula oxygen  Post-op Assessment: Report given to RN and Post -op Vital signs reviewed and stable  Post vital signs: Reviewed  Last Vitals:  Vitals Value Taken Time  BP 112/63 07/14/19 1128  Temp    Pulse 69 07/14/19 1132  Resp 14 07/14/19 1132  SpO2 92 % 07/14/19 1132  Vitals shown include unvalidated device data.  Last Pain:  Vitals:   07/14/19 0738  TempSrc:   PainSc: 0-No pain         Complications: No apparent anesthesia complications

## 2019-07-14 NOTE — Op Note (Signed)
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  Postoperative diagnosis: Same  Operative procedure: Implantation of spinal cord stimulator  Complications: None  First Assistant: Cleta Alberts, PA  Implants: Abbott proclaim XR 5 battery.  Lamitrode S8 paddle x2.  Indications: Shelby [name] is a very pleasant 44 year old woman who has chronic debilitating back buttock and radicular/neuropathic leg pain.  Attempts at conservative management ultimately failed and she had a spinal cord stimulator trial.  This trial was successful in reducing her pain and improving her quality of life.  As result we elected to move forward with permanent implantation.  All appropriate risks benefits and alternatives to surgery were discussed with the patient and consent was obtained.  Operative note: Patient was brought the operating room placed upon the operating room table.  After successful induction of general anesthesia and endotracheal intubation teds SCDs were applied and she was turned prone onto the Wilson frame.  All bony prominences well-padded and the back was prepped and draped in a standard fashion.  Timeout was taken to confirm patient procedure and all other important data.  X-ray was used to identify the T12 vertebral body based on the last rib-bearing vertebrae.  I then marked out the T11-T12 disc space as well as the T10 pedicle.  I then marked my midline incision starting at the inferior aspect of T10 pedicle and proceeding to the T11-12 intervertebral space.  The incision site was infiltrated with quarter percent Marcaine with epinephrine and a midline incision was made.  Sharp dissection was carried out down to the deep fascia.  The deep fascia was sharply incised and I began to strip the paraspinal muscles using a Cobb and Bovie to expose the T10-T11 and a portion of the T12 spinous process and lamina.  I confirmed once again the T10-11  intervertebral space with fluoroscopy and marked this area.  Once I had the exposure I placed my self-retaining retractor and expanded to the appropriate width.  I then used a double-action Leksell rongeur to remove the posterior inferior third of the spinous process of T10 and portion of the superior aspect of the T11 spinous process I then used a fine nerve hook to develop a plane underneath the T10 lamina and performed a laminotomy of T10.  I then used a Penfield 4 to dissect through the central raphae of the ligamentum flavum and used a 1 mm and 2 mm Kerrison rongeur to resect the ligamentum flavum to expose the dorsal surface of the thecal sac.  I dissected through the epidural fat and visualize the thecal sac.  At this point I obtained the implant and passed the first paddle without difficulty.  I passed a second paddle slightly to the right as this was her more problematic side again with no difficulty.  Imaging studies confirm satisfactory position of the paddles spanning from the inferior portion of T7 to the inferior portion of T9.  I confirmed proper positioning in both the AP and lateral planes.  I also confirmed with the representative from [place] that the paddles were properly positioned and were covering the same areas addressed during the trial.  Once this was confirmed I then secured the leads directly to the T11 spinous process with a #1 Ethibond suture.  Once secured I then looped the lead around the T11 spinous process by incising the T11-T12 interspinous process ligament.  I then reapproximated this ligament with a #1 Vicryl suture.  At this point  a second incision over the right gluteal region was made and I dissected down approximately 3 cm and created a pocket.  I then passed the leads from the thoracic wound to the battery site using the submuscular passer.  I then secured the leads to the battery and torqued them tight according manufacture standards.  I then secured the place  the battery into the pocket and secured it with two #1 Vicryl sutures to the deep fascia.  At this point I confirmed that the implant was working appropriately and that the final x-rays were satisfactory and there was no migration of the leads.  At this point once confirmed that the implant and battery were working appropriately I irrigated both wounds copiously normal saline.  The deep fascia of both wounds were closed with interrupted #1 Vicryl suture, then a running 0 Vicryl suture in the thoracic wound, then a layer of 2-0 Vicryl suture, and 3-0 Monocryl for the skin.  Steri-Strips and dry dressings were applied and the patient was ultimately extubated transfer the PACU without incident.  At the end of the case all needle and sponge counts were correct.  There were no adverse intraoperative events.

## 2019-07-14 NOTE — Brief Op Note (Signed)
07/14/2019  11:00 AM  PATIENT:  Shelby [name]  44 y.o. female  PRE-OPERATIVE DIAGNOSIS:  Chronic pain, post laminectomy syndrome  POST-OPERATIVE DIAGNOSIS:  Chronic pain, post laminectomy syndrome  PROCEDURE:  Procedure(s) with comments: LUMBAR SPINAL CORD STIMULATOR INSERTION (N/A) - 3 hrs  SURGEON:  Surgeon(s) and Role:    Melina Schools, MD - Primary  PHYSICIAN ASSISTANT:   ASSISTANTS: Amanda Ward, PA   ANESTHESIA:   general  EBL:  150 mL   BLOOD ADMINISTERED:none  DRAINS: none   LOCAL MEDICATIONS USED:  MARCAINE     SPECIMEN:  No Specimen  DISPOSITION OF SPECIMEN:  N/A  COUNTS:  YES  TOURNIQUET:  * No tourniquets in log *  DICTATION: .Dragon Dictation  PLAN OF CARE: Admit for overnight observation  PATIENT DISPOSITION:  PACU - hemodynamically stable.

## 2019-07-14 NOTE — Anesthesia Postprocedure Evaluation (Signed)
Anesthesia Post Note  Patient: HARMONEY MCLELLAN  Procedure(s) Performed: LUMBAR SPINAL CORD STIMULATOR INSERTION (N/A )     Patient location during evaluation: PACU Anesthesia Type: General Level of consciousness: awake and alert Pain management: pain level controlled Vital Signs Assessment: post-procedure vital signs reviewed and stable Respiratory status: spontaneous breathing, nonlabored ventilation, respiratory function stable and patient connected to nasal cannula oxygen Cardiovascular status: blood pressure returned to baseline and stable Postop Assessment: no apparent nausea or vomiting Anesthetic complications: no    Last Vitals:  Vitals:   07/14/19 1215 07/14/19 1244  BP: (!) 139/93 (!) 89/77  Pulse: (!) 55 (!) 57  Resp: 18 20  Temp: 36.4 C 36.8 C  SpO2: 90% 94%    Last Pain:  Vitals:   07/14/19 1244  TempSrc: Oral  PainSc:                  Effie Berkshire

## 2019-07-14 NOTE — Plan of Care (Signed)
Patient alert and oriented, mae's well, voiding adequate amount of urine, swallowing without difficulty, no c/o pain at time of discharge. Patient discharged home with family. Script and discharged instructions given to patient. Patient and family stated understanding of instructions given. Patient has an appointment with Dr. Brooks  

## 2019-07-14 NOTE — H&P (Signed)
Addendum H&P  Patient presents today for implantation of a spinal cord stimulator for chronic pain syndrome.  She had a successful trial and is elected to move forward with permanent implantation.  I have reviewed the surgery as well as the risks and benefits and all of her questions were encouraged and addressed.  There is been no change in her clinical exam since her last office visit of 07/05/2019.

## 2019-07-15 ENCOUNTER — Encounter: Payer: Self-pay | Admitting: *Deleted
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
## 2019-07-21 ENCOUNTER — Telehealth: Payer: Self-pay | Admitting: Family Medicine

## 2019-07-21 NOTE — Telephone Encounter (Signed)
lmtcb

## 2019-07-22 NOTE — Telephone Encounter (Signed)
[facility] pharmacy says script for lexapro was picked up 06-15-19 and again 07-14-19.  Patient says maybe husband picked up on 07-14-19 cause she was in hospital.  Advised to check with spouse and look for bottle.   Lexapro is in history of medicines but unsure why.

## 2019-07-30 ENCOUNTER — Other Ambulatory Visit: Payer: Self-pay | Admitting: Family Medicine

## 2019-08-04 ENCOUNTER — Telehealth: Payer: Self-pay | Admitting: Family Medicine

## 2019-08-04 ENCOUNTER — Other Ambulatory Visit: Payer: Self-pay | Admitting: Family Medicine

## 2019-08-04 NOTE — Telephone Encounter (Signed)
  Prescription Request  08/04/2019  What is the name of the medication or equipment?ALPRAZolam (XANAX) 0.5 MG tablet   Have you contacted your pharmacy to request a refill? (if applicable) yes  Which pharmacy would you like this sent to? [facility] on [address] [place]    Patient notified that their request is being sent to the clinical staff for review and that they should receive a response within 2 business days.

## 2019-08-04 NOTE — Telephone Encounter (Signed)
Patient aware she will have to be seen to make an appt.Appt made

## 2019-08-09 ENCOUNTER — Other Ambulatory Visit: Payer: Self-pay

## 2019-08-09 ENCOUNTER — Ambulatory Visit (INDEPENDENT_AMBULATORY_CARE_PROVIDER_SITE_OTHER): Payer: Medicare Other | Admitting: Family Medicine

## 2019-08-09 ENCOUNTER — Encounter: Payer: Self-pay | Admitting: Family Medicine

## 2019-08-09 VITALS — BP 112/72 | HR 69 | Temp 97.0°F | Ht 67.0 in | Wt 242.4 lb

## 2019-08-09 DIAGNOSIS — F331 Major depressive disorder, recurrent, moderate: Secondary | ICD-10-CM

## 2019-08-09 DIAGNOSIS — F419 Anxiety disorder, unspecified: Secondary | ICD-10-CM | POA: Diagnosis not present

## 2019-08-09 DIAGNOSIS — Z79899 Other long term (current) drug therapy: Secondary | ICD-10-CM

## 2019-08-09 DIAGNOSIS — J4520 Mild intermittent asthma, uncomplicated: Secondary | ICD-10-CM

## 2019-08-09 DIAGNOSIS — Z1159 Encounter for screening for other viral diseases: Secondary | ICD-10-CM | POA: Diagnosis not present

## 2019-08-09 MED ORDER — ALPRAZOLAM 0.5 MG PO TABS: 0.7500 mg | ORAL_TABLET | Freq: Every day | ORAL | 5 refills | Status: DC

## 2019-08-09 MED ORDER — ONDANSETRON HCL 4 MG PO TABS: 4.0000 mg | ORAL_TABLET | Freq: Three times a day (TID) | ORAL | 2 refills | Status: DC | PRN

## 2019-08-09 MED ORDER — DULOXETINE HCL 30 MG PO CPEP: 30.0000 mg | ORAL_CAPSULE | Freq: Every day | ORAL | 1 refills | Status: DC

## 2019-08-09 MED ORDER — ONDANSETRON HCL 4 MG PO TABS: 4.0000 mg | ORAL_TABLET | Freq: Three times a day (TID) | ORAL | 0 refills | Status: DC | PRN

## 2019-08-09 MED ORDER — ARIPIPRAZOLE 20 MG PO TABS: 20.0000 mg | ORAL_TABLET | Freq: Every day | ORAL | 5 refills | Status: DC

## 2019-08-09 MED ORDER — ALBUTEROL SULFATE HFA 108 (90 BASE) MCG/ACT IN AERS: INHALATION_SPRAY | RESPIRATORY_TRACT | 1 refills | Status: DC

## 2019-08-09 MED ORDER — HYDROXYZINE HCL 25 MG PO TABS: 25.0000 mg | ORAL_TABLET | Freq: Three times a day (TID) | ORAL | 1 refills | Status: DC | PRN

## 2019-08-09 NOTE — Progress Notes (Signed)
Subjective:  Patient ID: Shelby [name], female    DOB: 09-24-1975  Age: 44 y.o. MRN: [id number]  CC: Follow-up (Med Refill, needs csa and uds)   HPI Shelby [name] presents for ongoing problems with anxiety and depression. This relates back to her PTSD. Patient relates also that she had back surgery for implantation of electronic stimulator 4 weeks ago. It feels much better now. However she is having some trouble with energy and pain with motion. She is planning on going back to work in about three more days.  Today the patient relates that she takes the Xanax for anxiety and sleep 1-1/2 tablets at night. She does not sleep well without it she does have a lot of issues with worry. Based on her scores below it would seem controllable but then discussion she states that it is more frequent than what would be indicated on her GAD-7.  GAD 7 : Generalized Anxiety Score 08/09/2019 07/06/2019 01/26/2019 07/10/2018  Nervous, Anxious, on Edge _0 Control/stop worrying _1 Worry too much - different things _2 Trouble relaxing _3 Restless _4 Easily annoyed or irritable 1 0 2 2  Afraid - awful might happen 1 0 1 0  Total GAD 7 Score _5 Anxiety Difficulty Somewhat difficult - - Somewhat difficult      Depression screen [facility] 2/9 08/09/2019 07/06/2019 06/15/2019  Decreased Interest 0 1 3  Down, Depressed, Hopeless _6 PHQ - 2 Score _7 Altered sleeping _8 Tired, decreased energy _9 Change in appetite _10 Feeling bad or failure about yourself  1 0 3  Trouble concentrating 0 1 3  Moving slowly or fidgety/restless 0 0 0  Suicidal thoughts 0 0 0  PHQ-9 Score _11 Difficult doing work/chores Somewhat difficult Not difficult at all Very difficult  Some recent data might be hidden    History Shelby [name] has a past medical history of Anemia, Anxiety, Arthritis, Asthma, Bad memory, Bipolar disorder ([place]), Chronic kidney disease, Depression,  Endometriosis, Migraines, Pneumonia, Sleep apnea, and Traumatic brain injury ([place]) (2001).   She has a past surgical history that includes Cesarean section; cyst remove; uterine ablation; Tubal ligation; Umbilical hernia repair (09/20/2011); Incisional hernia repair (12/27/2011); Wound debridement (12/27/2011); Insertion of mesh (12/27/2011); Hand surgery; Abdominal hysterectomy (06/2017); Colonoscopy with propofol (N/A, 09/22/2017); Esophagogastroduodenoscopy (egd) with propofol (N/A, 03/12/2018); maloney dilation (N/A, 03/12/2018); and Spinal cord stimulator insertion (N/A, 07/14/2019).   Her family history includes Alzheimer's disease in her maternal grandmother and paternal grandmother; Colon cancer in her paternal grandfather; Diabetes in her father; Healthy in her daughter and daughter.She reports that she has been smoking cigarettes. She has a 12.50 pack-year smoking history. She has never used smokeless tobacco. She reports that she does not drink alcohol and does not use drugs.    ROS Review of Systems  Constitutional: Negative.   HENT: Negative.   Eyes: Negative for visual disturbance.  Respiratory: Negative for shortness of breath.   Cardiovascular: Negative for chest pain.  Gastrointestinal: Negative for abdominal pain.  Musculoskeletal: Negative for arthralgias.  Psychiatric/Behavioral: Positive for decreased concentration, dysphoric mood and sleep disturbance. The patient is nervous/anxious.     Objective:  BP 112/72   Pulse 69   Temp (!) 97 F (36.1 C) (Temporal)   Ht 5' 7" (  1.702 m)   Wt 242 lb 6.4 oz (110 kg)   LMP 05/12/2016   BMI 37.97 kg/m   BP Readings from Last 3 Encounters:  08/09/19 112/72  07/14/19 (!) 89/77  07/13/19 125/85    Wt Readings from Last 3 Encounters:  08/09/19 242 lb 6.4 oz (110 kg)  07/14/19 240 lb (108.9 kg)  07/13/19 235 lb 6.4 oz (106.8 kg)     Physical Exam Constitutional:      General: She is not in acute distress.    Appearance: She  is well-developed.  Cardiovascular:     Rate and Rhythm: Normal rate and regular rhythm.  Pulmonary:     Breath sounds: Normal breath sounds.  Skin:    General: Skin is warm and dry.  Neurological:     Mental Status: She is alert and oriented to person, place, and time.       Assessment & Plan:   Shelby [name] was seen today for follow-up.  Diagnoses and all orders for this visit:  Anxiety -     CBC with Differential/Platelet -     CMP14+EGFR  Moderate episode of recurrent major depressive disorder (Shelby [name]) -     CBC with Differential/Platelet -     CMP14+EGFR  Controlled substance agreement signed -     ToxASSURE Select 13 (MW), Urine  Need for hepatitis C screening test -     Hepatitis C antibody  Mild intermittent asthma without complication  Other orders -     albuterol (VENTOLIN HFA) 108 (90 Base) MCG/ACT inhaler; INHALE 2 PUFFS INTO THE LUNGS EVERY 6 HOURS AS NEEDED FOR WHEEZING OR SHORTNESS OF BREATH -     ALPRAZolam (XANAX) 0.5 MG tablet; Take 1.5 tablets (0.75 mg total) by mouth at bedtime. -     ARIPiprazole (ABILIFY) 20 MG tablet; Take 1 tablet (20 mg total) by mouth at bedtime. (Needs to be seen before next refill) -     DULoxetine (CYMBALTA) 30 MG capsule; Take 1 capsule (30 mg total) by mouth daily. -     hydrOXYzine (ATARAX/VISTARIL) 25 MG tablet; Take 1 tablet (25 mg total) by mouth 3 (three) times daily as needed for anxiety. -     ondansetron (ZOFRAN) 4 MG tablet; Take 1 tablet (4 mg total) by mouth every 8 (eight) hours as needed for nausea or vomiting.       I have discontinued Shelby [name]'s oxyCODONE-acetaminophen and clindamycin. I have also changed her DULoxetine and hydrOXYzine. Additionally, I am having her maintain her fluticasone furoate-vilanterol, polyethylene glycol, esomeprazole, albuterol, ALPRAZolam, ARIPiprazole, and ondansetron.  Allergies as of 08/09/2019      Reactions   Chantix [varenicline] Other (See Comments)   Nightmares    Penicillins Other (See Comments)   Unknown Has patient had a PCN reaction causing immediate rash, facial/tongue/throat swelling, SOB or lightheadedness with hypotension: Unknown Has patient had a PCN reaction causing severe rash involving mucus membranes or skin necrosis: Unknown Has patient had a PCN reaction that required hospitalization: Unknown Has patient had a PCN reaction occurring within the last 10 years: No If all of the above answers are "NO", then may proceed with Cephalosporin use.   Latex Rash      Medication List       Accurate as of August 09, 2019 12:41 PM. If you have any questions, ask your nurse or doctor.        STOP taking these medications   clindamycin 150 MG capsule Commonly known as: CLEOCIN  Stopped by: Claretta Fraise, MD   oxyCODONE-acetaminophen 5-325 MG tablet Commonly known as: PERCOCET/ROXICET Stopped by: Claretta Fraise, MD     TAKE these medications   albuterol 108 (90 Base) MCG/ACT inhaler Commonly known as: VENTOLIN HFA INHALE 2 PUFFS INTO THE LUNGS EVERY 6 HOURS AS NEEDED FOR WHEEZING OR SHORTNESS OF BREATH What changed: See the new instructions.   ALPRAZolam 0.5 MG tablet Commonly known as: XANAX Take 1.5 tablets (0.75 mg total) by mouth at bedtime.   ARIPiprazole 20 MG tablet Commonly known as: ABILIFY Take 1 tablet (20 mg total) by mouth at bedtime. (Needs to be seen before next refill)   Breo Ellipta 100-25 MCG/INH Aepb Generic drug: fluticasone furoate-vilanterol Inhale 1 puff into the lungs daily as needed (asthma).   DULoxetine 30 MG capsule Commonly known as: CYMBALTA Take 1 capsule (30 mg total) by mouth daily.   esomeprazole 40 MG capsule Commonly known as: NexIUM Take 1 capsule (40 mg total) by mouth 2 (two) times daily before a meal.   hydrOXYzine 25 MG tablet Commonly known as: ATARAX/VISTARIL Take 1 tablet (25 mg total) by mouth 3 (three) times daily as needed for anxiety.   ondansetron 4 MG tablet Commonly known  as: Zofran Take 1 tablet (4 mg total) by mouth every 8 (eight) hours as needed for nausea or vomiting.   polyethylene glycol 17 g packet Commonly known as: MIRALAX / GLYCOLAX Take 17 g by mouth daily as needed for mild constipation.        Follow-up: Return in about 6 months (around 02/08/2020).  Claretta Fraise, M.D.

## 2019-08-09 NOTE — Addendum Note (Signed)
Addended by: Claretta Fraise on: 08/09/2019 06:12 PM   Modules accepted: Orders

## 2019-08-10 LAB — CBC WITH DIFFERENTIAL/PLATELET
Basophils Absolute: 0.1 10*3/uL (ref 0.0–0.2)
Basos: 1 %
EOS (ABSOLUTE): 0.8 10*3/uL — ABNORMAL HIGH (ref 0.0–0.4)
Eos: 7 %
Hematocrit: 41.6 % (ref 34.0–46.6)
Hemoglobin: 14.2 g/dL (ref 11.1–15.9)
Immature Grans (Abs): 0 10*3/uL (ref 0.0–0.1)
Immature Granulocytes: 0 %
Lymphocytes Absolute: 2.7 10*3/uL (ref 0.7–3.1)
Lymphs: 21 %
MCH: 30 pg (ref 26.6–33.0)
MCHC: 34.1 g/dL (ref 31.5–35.7)
MCV: 88 fL (ref 79–97)
Monocytes Absolute: 0.6 10*3/uL (ref 0.1–0.9)
Monocytes: 5 %
Neutrophils Absolute: 8.6 10*3/uL — ABNORMAL HIGH (ref 1.4–7.0)
Neutrophils: 66 %
Platelets: 366 10*3/uL (ref 150–450)
RBC: 4.73 x10E6/uL (ref 3.77–5.28)
RDW: 12.7 % (ref 11.7–15.4)
WBC: 12.9 10*3/uL — ABNORMAL HIGH (ref 3.4–10.8)

## 2019-08-10 LAB — CMP14+EGFR
ALT: 10 IU/L (ref 0–32)
AST: 11 IU/L (ref 0–40)
Albumin/Globulin Ratio: 1.7 (ref 1.2–2.2)
Albumin: 4.2 g/dL (ref 3.8–4.8)
Alkaline Phosphatase: 89 IU/L (ref 48–121)
BUN/Creatinine Ratio: 7 — ABNORMAL LOW (ref 9–23)
BUN: 5 mg/dL — ABNORMAL LOW (ref 6–24)
Bilirubin Total: <0.2 mg/dL (ref 0.0–1.2)
CO2: 22 mmol/L (ref 20–29)
Calcium: 9.3 mg/dL (ref 8.7–10.2)
Chloride: 104 mmol/L (ref 96–106)
Creatinine, Ser: 0.68 mg/dL (ref 0.57–1.00)
GFR calc Af Amer: 124 mL/min/{1.73_m2} (ref >59)
GFR calc non Af Amer: 107 mL/min/{1.73_m2} (ref >59)
Globulin, Total: 2.5 g/dL (ref 1.5–4.5)
Glucose: 80 mg/dL (ref 65–99)
Potassium: 4.2 mmol/L (ref 3.5–5.2)
Sodium: 141 mmol/L (ref 134–144)
Total Protein: 6.7 g/dL (ref 6.0–8.5)

## 2019-08-10 LAB — HEPATITIS C ANTIBODY: Hep C Virus Ab: <0.1 s/co ratio (ref 0.0–0.9)

## 2019-08-10 NOTE — Progress Notes (Signed)
Hello Fabian,  Your lab result is normal and/or stable.Some minor variations that are not significant are commonly marked abnormal, but do not represent any medical problem for you.  Best regards, Deeanne Deininger, M.D.

## 2019-08-14 LAB — TOXASSURE SELECT 13 (MW), URINE

## 2019-08-28 ENCOUNTER — Other Ambulatory Visit: Payer: Self-pay | Admitting: Family Medicine

## 2019-09-30 ENCOUNTER — Other Ambulatory Visit: Payer: Self-pay | Admitting: Family Medicine

## 2019-10-01 NOTE — Telephone Encounter (Signed)
Originally given by ortho and they advised to D/C after being discharged from hospital. Please advise on refill

## 2019-10-05 ENCOUNTER — Telehealth: Payer: Self-pay | Admitting: Family Medicine

## 2019-10-05 NOTE — Telephone Encounter (Signed)
Pt informed this was discontinued when she has surgery on 07/14/19

## 2019-10-05 NOTE — Telephone Encounter (Signed)
  Prescription Request  10/05/2019  What is the name of the medication or equipment? meloxicam   Have you contacted your pharmacy to request a refill? (if applicable) yes  Which [facility] you [facility] sent to[facility] [place]   Patient notified that their request is being sent to the clinical staff for review and that they should receive a response within 2 business days.

## 2019-10-07 ENCOUNTER — Ambulatory Visit: Payer: Medicare Other | Admitting: Family Medicine

## 2019-10-13 ENCOUNTER — Ambulatory Visit (INDEPENDENT_AMBULATORY_CARE_PROVIDER_SITE_OTHER): Payer: Medicare Other | Admitting: Family Medicine

## 2019-10-13 ENCOUNTER — Other Ambulatory Visit: Payer: Self-pay

## 2019-10-13 ENCOUNTER — Encounter: Payer: Self-pay | Admitting: Family Medicine

## 2019-10-13 VITALS — BP 117/69 | HR 73 | Temp 97.2°F | Resp 20 | Ht 67.0 in | Wt 238.2 lb

## 2019-10-13 DIAGNOSIS — R1031 Right lower quadrant pain: Secondary | ICD-10-CM

## 2019-10-13 DIAGNOSIS — F332 Major depressive disorder, recurrent severe without psychotic features: Secondary | ICD-10-CM | POA: Diagnosis not present

## 2019-10-13 MED ORDER — ARIPIPRAZOLE 20 MG PO TABS: 20.0000 mg | ORAL_TABLET | Freq: Every day | ORAL | 5 refills | Status: DC

## 2019-10-13 MED ORDER — DULOXETINE HCL 30 MG PO CPEP: 30.0000 mg | ORAL_CAPSULE | Freq: Every day | ORAL | 1 refills | Status: DC

## 2019-10-13 NOTE — Progress Notes (Signed)
Subjective:  Patient ID: Shelby [name], female    DOB: 11-Aug-1975  Age: 44 y.o. MRN: [id number]  CC: Medical Management of Chronic Issues   HPI [name] presents for severe anxiety and depression.  PHQ score noted below.  This was updated today.  Her anxiety score is similar to previous as noted below. She is also having some chronic abdominal pain.  It is moderate it is intermittent.  It does not have a cramp it is more of a dull ache with occasional sharp component. Depression screen [facility] 2/9 10/13/2019 08/09/2019 07/06/2019  Decreased Interest 3 0 1  Down, Depressed, Hopeless _0 PHQ - 2 Score _1 Altered sleeping _2 Tired, decreased energy _3 Change in appetite _4 Feeling bad or failure about yourself  3 1 0  Trouble concentrating 3 0 1  Moving slowly or fidgety/restless 0 0 0  Suicidal thoughts 0 0 0  PHQ-9 Score _5 Difficult doing work/chores - Somewhat difficult Not difficult at all  Some recent data might be hidden   GAD 7 : Generalized Anxiety Score 08/09/2019 07/06/2019 01/26/2019 07/10/2018  Nervous, Anxious, on Edge _6 Control/stop worrying _7 Worry too much - different things _8 Trouble relaxing _9 Restless _10 Easily annoyed or irritable 1 0 2 2  Afraid - awful might happen 1 0 1 0  Total GAD 7 Score _11 Anxiety Difficulty Somewhat difficult - - Somewhat difficult     History [name] has a past medical history of Anemia, Anxiety, Arthritis, Asthma, Bad memory, Bipolar disorder ([place]), Chronic kidney disease, Depression, Endometriosis, Migraines, Pneumonia, Sleep apnea, and Traumatic brain injury ([place]) (2001).   She has a past surgical history that includes Cesarean section; cyst remove; uterine ablation; Tubal ligation; Umbilical hernia repair (09/20/2011); Incisional hernia repair (12/27/2011); Wound debridement (12/27/2011); Insertion of mesh (12/27/2011); Hand surgery; Abdominal hysterectomy  (06/2017); Colonoscopy with propofol (N/A, 09/22/2017); Esophagogastroduodenoscopy (egd) with propofol (N/A, 03/12/2018); maloney dilation (N/A, 03/12/2018); and Spinal cord stimulator insertion (N/A, 07/14/2019).   Her family history includes Alzheimer's disease in her maternal grandmother and paternal grandmother; Colon cancer in her paternal grandfather; Diabetes in her father; Healthy in her daughter and daughter.She reports that she has been smoking cigarettes. She has a 12.50 pack-year smoking history. She has never used smokeless tobacco. She reports that she does not drink alcohol and does not use drugs.    ROS Review of Systems  Constitutional: Negative for fever.  HENT: Negative for congestion, rhinorrhea and sore throat.   Respiratory: Negative for cough and shortness of breath.   Cardiovascular: Negative for chest pain and palpitations.  Gastrointestinal: Positive for abdominal pain and nausea.  Musculoskeletal: Negative for arthralgias and myalgias.    Objective:  BP 117/69   Pulse 73   Temp (!) 97.2 F (36.2 C) (Temporal)   Resp 20   Ht 5' 7" (1.702 m)   Wt 238 lb 4 oz (108.1 kg)   LMP 05/12/2016   SpO2 97%   BMI 37.32 kg/m   BP Readings from Last 3 Encounters:  10/13/19 117/69  08/09/19 112/72  07/14/19 (!) 89/77    Wt Readings from Last 3 Encounters:  10/13/19 238 lb 4 oz (108.1 kg)  08/09/19 242 lb 6.4 oz (110 kg)  07/14/19 240  lb (108.9 kg)     Physical Exam Constitutional:      General: She is not in acute distress.    Appearance: She is well-developed.  HENT:     Head: Normocephalic and atraumatic.     Right Ear: External ear normal.     Left Ear: External ear normal.     Nose: Nose normal.  Eyes:     Conjunctiva/sclera: Conjunctivae normal.     Pupils: Pupils are equal, round, and reactive to light.  Neck:     Thyroid: No thyromegaly.  Cardiovascular:     Rate and Rhythm: Normal rate and regular rhythm.     Heart sounds: Normal heart sounds.  No murmur heard.   Pulmonary:     Effort: Pulmonary effort is normal. No respiratory distress.     Breath sounds: Normal breath sounds. No wheezing or rales.  Abdominal:     General: Bowel sounds are normal. There is no distension.     Palpations: Abdomen is soft.     Tenderness: There is abdominal tenderness (At McBurney's point). There is guarding (At McBurney's point).  Musculoskeletal:     Cervical back: Normal range of motion and neck supple.  Lymphadenopathy:     Cervical: No cervical adenopathy.  Skin:    General: Skin is warm and dry.  Neurological:     Mental Status: She is alert and oriented to person, place, and time.     Deep Tendon Reflexes: Reflexes are normal and symmetric.  Psychiatric:        Behavior: Behavior normal.        Thought Content: Thought content normal.        Judgment: Judgment normal.       Assessment & Plan:   Meara was seen today for medical management of chronic issues.  Diagnoses and all orders for this visit:  Severe episode of recurrent major depressive disorder, without psychotic features ([place])  Right lower quadrant abdominal pain -     CT ABDOMEN PELVIS W CONTRAST; Future -     CBC with Differential/Platelet -     CMP14+EGFR -     Lipase  Other orders -     ARIPiprazole (ABILIFY) 20 MG tablet; Take 1 tablet (20 mg total) by mouth at bedtime. (Needs to be seen before next refill) -     DULoxetine (CYMBALTA) 30 MG capsule; Take 1 capsule (30 mg total) by mouth daily.       I have discontinued Allyse M. Crawshaw's escitalopram. I am also having her maintain her fluticasone furoate-vilanterol, polyethylene glycol, esomeprazole, albuterol, ALPRAZolam, hydrOXYzine, ondansetron, ARIPiprazole, and DULoxetine.  Allergies as of 10/13/2019      Reactions   Chantix [varenicline] Other (See Comments)   Nightmares   Penicillins Other (See Comments)   Unknown Has patient had a PCN reaction causing immediate rash, facial/tongue/throat  swelling, SOB or lightheadedness with hypotension: Unknown Has patient had a PCN reaction causing severe rash involving mucus membranes or skin necrosis: Unknown Has patient had a PCN reaction that required hospitalization: Unknown Has patient had a PCN reaction occurring within the last 10 years: No If all of the above answers are "NO", then may proceed with Cephalosporin use.   Latex Rash      Medication List       Accurate as of October 13, 2019 11:59 PM. If you have any questions, ask your nurse or doctor.        STOP taking these medications   escitalopram  20 MG tablet Commonly known as: LEXAPRO Stopped by: Claretta Fraise, MD     TAKE these medications   albuterol 108 (90 Base) MCG/ACT inhaler Commonly known as: VENTOLIN HFA INHALE 2 PUFFS INTO THE LUNGS EVERY 6 HOURS AS NEEDED FOR WHEEZING OR SHORTNESS OF BREATH   ALPRAZolam 0.5 MG tablet Commonly known as: XANAX Take 1.5 tablets (0.75 mg total) by mouth at bedtime.   ARIPiprazole 20 MG tablet Commonly known as: ABILIFY Take 1 tablet (20 mg total) by mouth at bedtime. (Needs to be seen before next refill)   Breo Ellipta 100-25 MCG/INH Aepb Generic drug: fluticasone furoate-vilanterol Inhale 1 puff into the lungs daily as needed (asthma).   DULoxetine 30 MG capsule Commonly known as: CYMBALTA Take 1 capsule (30 mg total) by mouth daily.   esomeprazole 40 MG capsule Commonly known as: NexIUM Take 1 capsule (40 mg total) by mouth 2 (two) times daily before a meal.   hydrOXYzine 25 MG tablet Commonly known as: ATARAX/VISTARIL Take 1 tablet (25 mg total) by mouth 3 (three) times daily as needed for anxiety.   ondansetron 4 MG tablet Commonly known as: Zofran Take 1 tablet (4 mg total) by mouth every 8 (eight) hours as needed for nausea or vomiting.   polyethylene glycol 17 g packet Commonly known as: MIRALAX / GLYCOLAX Take 17 g by mouth daily as needed for mild constipation.        Follow-up: Return in  about 6 weeks (around 11/24/2019), or if symptoms worsen or fail to improve.  Claretta Fraise, M.D.

## 2019-10-13 NOTE — Patient Instructions (Signed)
Your provider wants you to schedule an appointment with a Psychologist/Psychiatrist. The following list of offices requires the patient to call and make their own appointment, as there is information they need that only you can provide. Please feel free to choose form the following providers:   [facility]   [phone] Crisis Recovery in [place] [phone]  [facility]  [phone]   [address] [place], [place]  (Scheduled through [facility]) Must call and do an interview for appointment. Sees Children / Accepts Medicaid  [facility]    [phone]  [address], [address]    [place], [place]       [facility]  [phone] [address] [place], [place]  Evaluates for Autism but does not treat it Sees Children / Accepts Medicaid  [facility]    [phone] [address], [address]   [place], [place] Medication management, substance abuse, bipolar, grief, family, marriage, OCD, anxiety, PTSD Sees children / Accepts Medicaid  [facility]    [phone] [address] [place], [place] Sees children / Accepts Medicaid  [facility]  [phone] [address] [place], [place]   Dr Akinlayo     [phone] [address] [place], [place]  Sees ADD & ADHD for treatment Accepts Medicaid  [facility]  [phone] [address] [place], [place] Evaluates for Autism Accepts Medicaid  [facility]  [phone] [address] [place], [place]  Does Adult ADD evaluations Does not accept Medicaid  [facility]   [phone] [address]   , [place] Uses animal therapy  Sees children as young as 3 years old Accepts Medicaid  [facility]     [phone]    [address]  ,  [address] Sees children Accepts Medicaid  

## 2019-10-14 ENCOUNTER — Telehealth: Payer: Self-pay | Admitting: Family Medicine

## 2019-10-14 LAB — CBC WITH DIFFERENTIAL/PLATELET
Basophils Absolute: 0.1 10*3/uL (ref 0.0–0.2)
Basos: 1 %
EOS (ABSOLUTE): 0.6 10*3/uL — ABNORMAL HIGH (ref 0.0–0.4)
Eos: 5 %
Hematocrit: 41.6 % (ref 34.0–46.6)
Hemoglobin: 14 g/dL (ref 11.1–15.9)
Immature Grans (Abs): 0 10*3/uL (ref 0.0–0.1)
Immature Granulocytes: 0 %
Lymphocytes Absolute: 2.8 10*3/uL (ref 0.7–3.1)
Lymphs: 26 %
MCH: 28.8 pg (ref 26.6–33.0)
MCHC: 33.7 g/dL (ref 31.5–35.7)
MCV: 86 fL (ref 79–97)
Monocytes Absolute: 0.5 10*3/uL (ref 0.1–0.9)
Monocytes: 5 %
Neutrophils Absolute: 7.1 10*3/uL — ABNORMAL HIGH (ref 1.4–7.0)
Neutrophils: 63 %
Platelets: 355 10*3/uL (ref 150–450)
RBC: 4.86 x10E6/uL (ref 3.77–5.28)
RDW: 12.5 % (ref 11.7–15.4)
WBC: 11.1 10*3/uL — ABNORMAL HIGH (ref 3.4–10.8)

## 2019-10-14 LAB — CMP14+EGFR
ALT: 8 IU/L (ref 0–32)
AST: 10 IU/L (ref 0–40)
Albumin/Globulin Ratio: 2 (ref 1.2–2.2)
Albumin: 4.4 g/dL (ref 3.8–4.8)
Alkaline Phosphatase: 90 IU/L (ref 48–121)
BUN/Creatinine Ratio: 7 — ABNORMAL LOW (ref 9–23)
BUN: 5 mg/dL — ABNORMAL LOW (ref 6–24)
Bilirubin Total: 0.3 mg/dL (ref 0.0–1.2)
CO2: 27 mmol/L (ref 20–29)
Calcium: 9.1 mg/dL (ref 8.7–10.2)
Chloride: 103 mmol/L (ref 96–106)
Creatinine, Ser: 0.68 mg/dL (ref 0.57–1.00)
GFR calc Af Amer: 124 mL/min/{1.73_m2} (ref >59)
GFR calc non Af Amer: 107 mL/min/{1.73_m2} (ref >59)
Globulin, Total: 2.2 g/dL (ref 1.5–4.5)
Glucose: 137 mg/dL — ABNORMAL HIGH (ref 65–99)
Potassium: 3.2 mmol/L — ABNORMAL LOW (ref 3.5–5.2)
Sodium: 142 mmol/L (ref 134–144)
Total Protein: 6.6 g/dL (ref 6.0–8.5)

## 2019-10-14 LAB — LIPASE: Lipase: 31 U/L (ref 14–72)

## 2019-10-14 NOTE — Telephone Encounter (Signed)
She should make the switch directly. MNo weaning is needed when switching between those two,

## 2019-10-14 NOTE — Telephone Encounter (Signed)
Pt had visit with Dr Livia Snellen yesterday and says that he switched her meds. Says she was taking Escitalopram and is now taking Duloxetine. Wants to know if she is supposed to completely stop taking what she was or does she need to be winged off of it? Wants clarification on what to take.

## 2019-10-14 NOTE — Telephone Encounter (Signed)
Patient aware and verbalized understanding. °

## 2019-10-15 ENCOUNTER — Telehealth: Payer: Self-pay | Admitting: Family Medicine

## 2019-10-17 NOTE — Progress Notes (Signed)
Hello Shelby [name],  Your lab result is normal and/or stable.Some minor variations that are not significant are commonly marked abnormal, but do not represent any medical problem for you.  Best regards, Claretta Fraise, M.D.

## 2019-11-09 ENCOUNTER — Ambulatory Visit (HOSPITAL_COMMUNITY)
Admission: RE | Admit: 2019-11-09 | Discharge: 2019-11-09 | Disposition: A | Discharge status: Home / Self Care | Payer: Medicare Other | Source: Ambulatory Visit | Attending: Family Medicine | Admitting: Family Medicine

## 2019-11-09 ENCOUNTER — Telehealth: Payer: Self-pay | Admitting: Family Medicine

## 2019-11-09 ENCOUNTER — Other Ambulatory Visit: Payer: Self-pay

## 2019-11-09 DIAGNOSIS — R1031 Right lower quadrant pain: Secondary | ICD-10-CM | POA: Diagnosis not present

## 2019-11-09 MED ORDER — IOHEXOL 300 MG/ML  SOLN
100.0000 mL | Freq: Once | INTRAMUSCULAR | Status: AC | PRN
  Administered 2019-11-09: 100 mL via INTRAVENOUS

## 2019-11-09 NOTE — Telephone Encounter (Signed)
Patient informed that we have not received results yet, that radiologist has not read the CT scan.  Informed her that once the radiologist has read and Dr. Livia Snellen has reviewed we will contact her with results.

## 2019-11-15 ENCOUNTER — Telehealth: Payer: Self-pay

## 2019-11-15 ENCOUNTER — Ambulatory Visit: Payer: Medicare Other | Admitting: Family Medicine

## 2019-11-15 ENCOUNTER — Other Ambulatory Visit: Payer: Self-pay

## 2019-11-15 ENCOUNTER — Telehealth: Payer: Self-pay | Admitting: Family Medicine

## 2019-11-15 ENCOUNTER — Other Ambulatory Visit: Payer: Self-pay | Admitting: Family Medicine

## 2019-11-15 DIAGNOSIS — N838 Other noninflammatory disorders of ovary, fallopian tube and broad ligament: Secondary | ICD-10-CM

## 2019-11-15 NOTE — Telephone Encounter (Signed)
Pt came in for her appt but ended up leaving due to provider running behind and pt having to go back to work.  Pt said todays appt was just to go over her results. Pt requested that someone just call her or her husband to give results.  [phone]

## 2019-11-15 NOTE — Telephone Encounter (Signed)
Husband aware of results

## 2019-11-15 NOTE — Telephone Encounter (Signed)
Husband aware of results and recommendations

## 2019-11-15 NOTE — Telephone Encounter (Signed)
See lab result note.

## 2019-11-16 ENCOUNTER — Ambulatory Visit (INDEPENDENT_AMBULATORY_CARE_PROVIDER_SITE_OTHER): Payer: Medicare Other | Admitting: Obstetrics & Gynecology

## 2019-11-16 ENCOUNTER — Encounter: Payer: Self-pay | Admitting: Obstetrics & Gynecology

## 2019-11-16 VITALS — BP 134/89 | HR 72 | Ht 68.0 in | Wt 234.0 lb

## 2019-11-16 DIAGNOSIS — C561 Malignant neoplasm of right ovary: Secondary | ICD-10-CM

## 2019-11-16 DIAGNOSIS — N83201 Unspecified ovarian cyst, right side: Secondary | ICD-10-CM

## 2019-11-16 DIAGNOSIS — N838 Other noninflammatory disorders of ovary, fallopian tube and broad ligament: Secondary | ICD-10-CM
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
## 2019-11-17 LAB — CA 125: Cancer Antigen (CA) 125: 21.4 U/mL (ref 0.0–38.1)

## 2019-11-22 ENCOUNTER — Other Ambulatory Visit: Payer: Self-pay | Admitting: Nurse Practitioner

## 2019-11-22 DIAGNOSIS — F339 Major depressive disorder, recurrent, unspecified: Secondary | ICD-10-CM

## 2019-11-27 ENCOUNTER — Other Ambulatory Visit: Payer: Self-pay | Admitting: Nurse Practitioner

## 2019-11-27 DIAGNOSIS — F339 Major depressive disorder, recurrent, unspecified: Secondary | ICD-10-CM

## 2019-11-30 ENCOUNTER — Telehealth: Payer: Self-pay | Admitting: Obstetrics & Gynecology

## 2019-11-30 NOTE — Telephone Encounter (Signed)
Patient called stating that she has a surgery coming up and she needs to speak with Dr. Elonda Husky regarding the surgery maybe to change some stuff. Patient has been informed that Dr. Elonda Husky is not in the office this week but we will try to help her out as much as possible. Please contact pt

## 2019-12-03 ENCOUNTER — Ambulatory Visit: Payer: Medicare Other | Admitting: *Deleted

## 2019-12-03 DIAGNOSIS — F339 Major depressive disorder, recurrent, unspecified: Secondary | ICD-10-CM

## 2019-12-03 NOTE — Chronic Care Management (AMB) (Signed)
  Chronic Care Management   Initial Visit Note  12/03/2019 Name: Shelby [name] MRN: [id number] DOB: 28-Nov-1975  Referred by: Shelby Fraise, MD Reason for referral : Chronic Care Management (Initial Visit)   Shelby [name] is a 44 y.o. year old female who is a primary care patient of Stacks, Cletus Gash, MD. The CCM team was consulted for assistance with chronic disease management and care coordination needs related to Depression and asthma  Review of patient status, including review of consultants reports, relevant laboratory and other test results, and collaboration with appropriate care team members and the patient's provider was performed as part of comprehensive patient evaluation and provision of chronic care management services.    SDOH (Social Determinants of Health) assessments performed: Yes See Care Plan activities for detailed interventions related to SDOH    I spoke with Shelby [name] by telephone today regarding management of her chronic medial conditions. She does not have any resource or CCM needs at this time and feels that her medical conditions are well managed. There is no need for CCM services at this time but she will reach out to the CCM team if a need arises.  Plan:  The patient has been provided with contact information for the care management team and has been advised to call with any health related questions or concerns.  CCM enrollment status changed to "previously enrolled" as discussed with patient on 12/03/19 to discontinue enrollment. Case closed to case management services in primary care home.   Chong Sicilian, BSN, RN-BC Embedded Chronic Care Manager [facility] [place] [facility] Direct Dial: [phone]

## 2019-12-03 NOTE — Patient Instructions (Signed)
Plan:  The patient has been provided with contact information for the care management team and has been advised to call with any health related questions or concerns.  CCM enrollment status changed to "previously enrolled" as discussed with patient on 12/03/19 to discontinue enrollment. Case closed to case management services in primary care home.   Chong Sicilian, BSN, RN-BC Embedded Chronic Care Manager [facility] Direct Dial: [phone]

## 2019-12-03 NOTE — Patient Instructions (Signed)
Shelby [name]  12/03/2019     @PREFPERIOPPHARMACY @   Your procedure is scheduled on  12/08/2019.  Report to [facility] at  0930  A.M.  Call this number if you have problems the morning of surgery:  [phone]   Remember:  Do not eat or drink after midnight.                        Take these medicines the morning of surgery with A SIP OF WATER  Cymbalta, lexapro, nexium.    Do not wear jewelry, make-up or nail polish.  Do not wear lotions, powders, or perfumes. Please wear deodorant and brush your teeth.  Do not shave 48 hours prior to surgery.  Men may shave face and neck.  Do not bring valuables to the hospital.  [facility] is not responsible for any belongings or valuables.  Contacts, dentures or bridgework may not be worn into surgery.  Leave your suitcase in the car.  After surgery it may be brought to your room.  For patients admitted to the hospital, discharge time will be determined by your treatment team.  Patients discharged the day of surgery will not be allowed to drive home.   Name and phone number of your driver:   family Special instructions:   DO NOT smoke the morning of your procedure.  Please read over the following fact sheets that you were given. Anesthesia Post-op Instructions and Care and Recovery After Surgery       Bilateral Salpingo-Oophorectomy, Care After This sheet gives you information about how to care for yourself after your procedure. Your health care provider may also give you more specific instructions. If you have problems or questions, contact your health care provider. What can I expect after the procedure? After the procedure, it is common to have:  Abdominal pain.  Some occasional vaginal bleeding (spotting).  Tiredness.  Symptoms of menopause, such as hot flashes, night sweats, or mood swings. Follow these instructions at home: Incision care   Keep your incision area and your bandage (dressing) clean  and dry.  Follow instructions from your health care provider about how to take care of your incision. Make sure you: ? Wash your hands with soap and water before you change your dressing. If soap and water are not available, use hand sanitizer. ? Change your dressing as told by your health care provider. ? Leave stitches (sutures), staples, skin glue, or adhesive strips in place. These skin closures may need to stay in place for 2 weeks or longer. If adhesive strip edges start to loosen and curl up, you may trim the loose edges. Do not remove adhesive strips completely unless your health care provider tells you to do that.  Check your incision area every day for signs of infection. Check for: ? Redness, swelling, or pain. ? Fluid or blood. ? Warmth. ? Pus or a bad smell. Activity   Do not drive or use heavy machinery while taking prescription pain medicine.  Do not drive for 24 hours if you received a medicine to help you relax (sedative) during your procedure.  Take frequent, short walks throughout the day. Rest when you get tired. Ask your health care provider what activities are safe for you.  Avoid activity that requires great effort. Also, avoid heavy lifting. Do not lift anything that is heavier than 10 lbs. (4.5 kg), or the limit that your health  care provider tells you, until he or she says that it is safe to do so.  Do not douche, use tampons, or have sex until your health care provider approves. General instructions   To prevent or treat constipation while you are taking prescription pain medicine, your health care provider may recommend that you: ? Drink enough fluid to keep your urine clear or pale yellow. ? Take over-the-counter or prescription medicines. ? Eat foods that are high in fiber, such as fresh fruits and vegetables, whole grains, and beans. ? Limit foods that are high in fat and processed sugars, such as fried and sweet foods.  Take over-the-counter and  prescription medicines only as told by your health care provider.  Do not take baths, swim, or use a hot tub until your health care provider approves. Ask your health care provider if you can take showers. You may only be allowed to take sponge baths for bathing.  Wear compression stockings as told by your health care provider. These stockings help to prevent blood clots and reduce swelling in your legs.  Keep all follow-up visits as told by your health care provider. This is important. Contact a health care provider if:  You have pain when you urinate.  You have pus or a bad smelling discharge coming from your vagina.  You have redness, swelling, or pain around your incision.  You have fluid or blood coming from your incision.  Your incision feels warm to the touch.  You have pus or a bad smell coming from your incision.  You have a fever.  Your incision starts to break open.  You have pain in the abdomen, and it gets worse or does not get better when you take medicine.  You develop a rash.  You develop nausea and vomiting.  You feel lightheaded. Get help right away if:  You develop pain in your chest or leg.  You become short of breath.  You faint.  You have increased bleeding from your vagina. Summary  After the procedure, it is common to have pain, bleeding in the vagina, and symptoms of menopause.  Follow instructions from your health care provider about how to take care of your incision.  Follow instructions from your health care provider about activities and restrictions.  Check your incision every day for signs of infection and report any symptoms to your health care provider. This information is not intended to replace advice given to you by your health care provider. Make sure you discuss any questions you have with your health care provider. Document Revised: 04/03/2018 Document Reviewed: 03/04/2016 Elsevier Patient Education  2020 [place] Anesthesia, Adult, Care After This sheet gives you information about how to care for yourself after your procedure. Your health care provider may also give you more specific instructions. If you have problems or questions, contact your health care provider. What can I expect after the procedure? After the procedure, the following side effects are common:  Pain or discomfort at the IV site.  Nausea.  Vomiting.  Sore throat.  Trouble concentrating.  Feeling cold or chills.  Weak or tired.  Sleepiness and fatigue.  Soreness and body aches. These side effects can affect parts of the body that were not involved in surgery. Follow these instructions at home:  For at least 24 hours after the procedure:  Have a responsible adult stay with you. It is important to have someone help care for you until you are awake and alert.  Rest as needed.  Do not: ? Participate in activities in which you could fall or become injured. ? Drive. ? Use heavy machinery. ? Drink alcohol. ? Take sleeping pills or medicines that cause drowsiness. ? Make important decisions or sign legal documents. ? Take care of children on your own. Eating and drinking  Follow any instructions from your health care provider about eating or drinking restrictions.  When you feel hungry, start by eating small amounts of foods that are soft and easy to digest (bland), such as toast. Gradually return to your regular diet.  Drink enough fluid to keep your urine pale yellow.  If you vomit, rehydrate by drinking water, juice, or clear broth. General instructions  If you have sleep apnea, surgery and certain medicines can increase your risk for breathing problems. Follow instructions from your health care provider about wearing your sleep device: ? Anytime you are sleeping, including during daytime naps. ? While taking prescription pain medicines, sleeping medicines, or medicines that make you  drowsy.  Return to your normal activities as told by your health care provider. Ask your health care provider what activities are safe for you.  Take over-the-counter and prescription medicines only as told by your health care provider.  If you smoke, do not smoke without supervision.  Keep all follow-up visits as told by your health care provider. This is important. Contact a health care provider if:  You have nausea or vomiting that does not get better with medicine.  You cannot eat or drink without vomiting.  You have pain that does not get better with medicine.  You are unable to pass urine.  You develop a skin rash.  You have a fever.  You have redness around your IV site that gets worse. Get help right away if:  You have difficulty breathing.  You have chest pain.  You have blood in your urine or stool, or you vomit blood. Summary  After the procedure, it is common to have a sore throat or nausea. It is also common to feel tired.  Have a responsible adult stay with you for the first 24 hours after general anesthesia. It is important to have someone help care for you until you are awake and alert.  When you feel hungry, start by eating small amounts of foods that are soft and easy to digest (bland), such as toast. Gradually return to your regular diet.  Drink enough fluid to keep your urine pale yellow.  Return to your normal activities as told by your health care provider. Ask your health care provider what activities are safe for you. This information is not intended to replace advice given to you by your health care provider. Make sure you discuss any questions you have with your health care provider. Document Revised: 01/31/2017 Document Reviewed: 09/13/2016 Elsevier Patient Education  [place]. How to Use Chlorhexidine for Bathing Chlorhexidine gluconate (CHG) is a germ-killing (antiseptic) solution that is used to clean the skin. It can get rid of  the bacteria that normally live on the skin and can keep them away for about 24 hours. To clean your skin with CHG, you may be given:  A CHG solution to use in the shower or as part of a sponge bath.  A prepackaged cloth that contains CHG. Cleaning your skin with CHG may help lower the risk for infection:  While you are staying in the intensive care unit of the hospital.  If you have a vascular access,  such as a central line, to provide short-term or long-term access to your veins.  If you have a catheter to drain urine from your bladder.  If you are on a ventilator. A ventilator is a machine that helps you breathe by moving air in and out of your lungs.  After surgery. What are the risks? Risks of using CHG include:  A skin reaction.  Hearing loss, if CHG gets in your ears.  Eye injury, if CHG gets in your eyes and is not rinsed out.  The CHG product catching fire. Make sure that you avoid smoking and flames after applying CHG to your skin. Do not use CHG:  If you have a chlorhexidine allergy or have previously reacted to chlorhexidine.  On babies younger than 7 months of age. How to use CHG solution  Use CHG only as told by your health care provider, and follow the instructions on the label.  Use the full amount of CHG as directed. Usually, this is one bottle. During a shower Follow these steps when using CHG solution during a shower (unless your health care provider gives you different instructions): 1. Start the shower. 2. Use your normal soap and shampoo to wash your face and hair. 3. Turn off the shower or move out of the shower stream. 4. Pour the CHG onto a clean washcloth. Do not use any type of brush or rough-edged sponge. 5. Starting at your neck, lather your body down to your toes. Make sure you follow these instructions: ? If you will be having surgery, pay special attention to the part of your body where you will be having surgery. Scrub this area for at  least 1 minute. ? Do not use CHG on your head or face. If the solution gets into your ears or eyes, rinse them well with water. ? Avoid your genital area. ? Avoid any areas of skin that have broken skin, cuts, or scrapes. ? Scrub your back and under your arms. Make sure to wash skin folds. 6. Let the lather sit on your skin for 1-2 minutes or as long as told by your health care provider. 7. Thoroughly rinse your entire body in the shower. Make sure that all body creases and crevices are rinsed well. 8. Dry off with a clean towel. Do not put any substances on your body afterward--such as powder, lotion, or perfume--unless you are told to do so by your health care provider. Only use lotions that are recommended by the manufacturer. 9. Put on clean clothes or pajamas. 10. If it is the night before your surgery, sleep in clean sheets.  During a sponge bath Follow these steps when using CHG solution during a sponge bath (unless your health care provider gives you different instructions): 1. Use your normal soap and shampoo to wash your face and hair. 2. Pour the CHG onto a clean washcloth. 3. Starting at your neck, lather your body down to your toes. Make sure you follow these instructions: ? If you will be having surgery, pay special attention to the part of your body where you will be having surgery. Scrub this area for at least 1 minute. ? Do not use CHG on your head or face. If the solution gets into your ears or eyes, rinse them well with water. ? Avoid your genital area. ? Avoid any areas of skin that have broken skin, cuts, or scrapes. ? Scrub your back and under your arms. Make sure to wash skin folds. 4.  Let the lather sit on your skin for 1-2 minutes or as long as told by your health care provider. 5. Using a different clean, wet washcloth, thoroughly rinse your entire body. Make sure that all body creases and crevices are rinsed well. 6. Dry off with a clean towel. Do not put any  substances on your body afterward--such as powder, lotion, or perfume--unless you are told to do so by your health care provider. Only use lotions that are recommended by the manufacturer. 7. Put on clean clothes or pajamas. 8. If it is the night before your surgery, sleep in clean sheets. How to use CHG prepackaged cloths  Only use CHG cloths as told by your health care provider, and follow the instructions on the label.  Use the CHG cloth on clean, dry skin.  Do not use the CHG cloth on your head or face unless your health care provider tells you to.  When washing with the CHG cloth: ? Avoid your genital area. ? Avoid any areas of skin that have broken skin, cuts, or scrapes. Before surgery Follow these steps when using a CHG cloth to clean before surgery (unless your health care provider gives you different instructions): 1. Using the CHG cloth, vigorously scrub the part of your body where you will be having surgery. Scrub using a back-and-forth motion for 3 minutes. The area on your body should be completely wet with CHG when you are done scrubbing. 2. Do not rinse. Discard the cloth and let the area air-dry. Do not put any substances on the area afterward, such as powder, lotion, or perfume. 3. Put on clean clothes or pajamas. 4. If it is the night before your surgery, sleep in clean sheets.  For general bathing Follow these steps when using CHG cloths for general bathing (unless your health care provider gives you different instructions). 1. Use a separate CHG cloth for each area of your body. Make sure you wash between any folds of skin and between your fingers and toes. Wash your body in the following order, switching to a new cloth after each step: ? The front of your neck, shoulders, and chest. ? Both of your arms, under your arms, and your hands. ? Your stomach and groin area, avoiding the genitals. ? Your right leg and foot. ? Your left leg and foot. ? The back of your neck,  your back, and your buttocks. 2. Do not rinse. Discard the cloth and let the area air-dry. Do not put any substances on your body afterward--such as powder, lotion, or perfume--unless you are told to do so by your health care provider. Only use lotions that are recommended by the manufacturer. 3. Put on clean clothes or pajamas. Contact a health care provider if:  Your skin gets irritated after scrubbing.  You have questions about using your solution or cloth. Get help right away if:  Your eyes become very red or swollen.  Your eyes itch badly.  Your skin itches badly and is red or swollen.  Your hearing changes.  You have trouble seeing.  You have swelling or tingling in your mouth or throat.  You have trouble breathing.  You swallow any chlorhexidine. Summary  Chlorhexidine gluconate (CHG) is a germ-killing (antiseptic) solution that is used to clean the skin. Cleaning your skin with CHG may help to lower your risk for infection.  You may be given CHG to use for bathing. It may be in a bottle or in a prepackaged cloth to  use on your skin. Carefully follow your health care provider's instructions and the instructions on the product label.  Do not use CHG if you have a chlorhexidine allergy.  Contact your health care provider if your skin gets irritated after scrubbing. This information is not intended to replace advice given to you by your health care provider. Make sure you discuss any questions you have with your health care provider. Document Revised: 04/16/2018 Document Reviewed: 12/26/2016 Elsevier Patient Education  [place].

## 2019-12-06 ENCOUNTER — Other Ambulatory Visit: Payer: Self-pay | Admitting: Obstetrics & Gynecology

## 2019-12-07 ENCOUNTER — Other Ambulatory Visit: Payer: Self-pay

## 2019-12-07 ENCOUNTER — Encounter (HOSPITAL_COMMUNITY): Payer: Self-pay

## 2019-12-07 ENCOUNTER — Other Ambulatory Visit (HOSPITAL_COMMUNITY)
Admission: RE | Admit: 2019-12-07 | Discharge: 2019-12-07 | Disposition: A | Discharge status: Home / Self Care | Payer: Medicare Other | Source: Ambulatory Visit | Attending: Obstetrics & Gynecology | Admitting: Obstetrics & Gynecology

## 2019-12-07 ENCOUNTER — Encounter (HOSPITAL_COMMUNITY)
Admission: RE | Admit: 2019-12-07 | Discharge: 2019-12-07 | Disposition: A | Discharge status: Home / Self Care | Payer: Medicare Other | Source: Ambulatory Visit | Attending: Obstetrics & Gynecology | Admitting: Obstetrics & Gynecology

## 2019-12-07 DIAGNOSIS — Z01818 Encounter for other preprocedural examination: Secondary | ICD-10-CM | POA: Insufficient documentation

## 2019-12-07 DIAGNOSIS — Z20822 Contact with and (suspected) exposure to covid-19: Secondary | ICD-10-CM | POA: Insufficient documentation

## 2019-12-07 LAB — URINALYSIS, ROUTINE W REFLEX MICROSCOPIC
Bilirubin Urine: NEGATIVE
Glucose, UA: NEGATIVE mg/dL
Ketones, ur: NEGATIVE mg/dL
Leukocytes,Ua: NEGATIVE
Nitrite: NEGATIVE
Protein, ur: NEGATIVE mg/dL
Specific Gravity, Urine: 1.002 — ABNORMAL LOW (ref 1.005–1.030)
pH: 7 (ref 5.0–8.0)

## 2019-12-07 LAB — CBC
HCT: 42.1 % (ref 36.0–46.0)
Hemoglobin: 14.1 g/dL (ref 12.0–15.0)
MCH: 29.7 pg (ref 26.0–34.0)
MCHC: 33.5 g/dL (ref 30.0–36.0)
MCV: 88.6 fL (ref 80.0–100.0)
Platelets: 336 10*3/uL (ref 150–400)
RBC: 4.75 MIL/uL (ref 3.87–5.11)
RDW: 13.2 % (ref 11.5–15.5)
WBC: 10.6 10*3/uL — ABNORMAL HIGH (ref 4.0–10.5)
nRBC: 0 % (ref 0.0–0.2)

## 2019-12-07 LAB — COMPREHENSIVE METABOLIC PANEL
ALT: 14 U/L (ref 0–44)
AST: 13 U/L — ABNORMAL LOW (ref 15–41)
Albumin: 3.9 g/dL (ref 3.5–5.0)
Alkaline Phosphatase: 60 U/L (ref 38–126)
Anion gap: 4 — ABNORMAL LOW (ref 5–15)
BUN: 6 mg/dL (ref 6–20)
CO2: 29 mmol/L (ref 22–32)
Calcium: 8.5 mg/dL — ABNORMAL LOW (ref 8.9–10.3)
Chloride: 106 mmol/L (ref 98–111)
Creatinine, Ser: 0.66 mg/dL (ref 0.44–1.00)
GFR, Estimated: >60 mL/min (ref >60)
Glucose, Bld: 70 mg/dL (ref 70–99)
Potassium: 3.3 mmol/L — ABNORMAL LOW (ref 3.5–5.1)
Sodium: 139 mmol/L (ref 135–145)
Total Bilirubin: 0.6 mg/dL (ref 0.3–1.2)
Total Protein: 6.7 g/dL (ref 6.5–8.1)

## 2019-12-07 LAB — RAPID HIV SCREEN (HIV 1/2 AB+AG)
HIV 1/2 Antibodies: NONREACTIVE
HIV-1 P24 Antigen - HIV24: NONREACTIVE

## 2019-12-07 LAB — HCG, QUANTITATIVE, PREGNANCY: hCG, Beta Chain, Quant, S: <1 m[IU]/mL (ref <5)

## 2019-12-07 LAB — TYPE AND SCREEN
ABO/RH(D): O POS
Antibody Screen: NEGATIVE

## 2019-12-07 LAB — SARS CORONAVIRUS 2 (TAT 6-24 HRS): SARS Coronavirus 2: NEGATIVE

## 2019-12-07 MED ORDER — CLINDAMYCIN PHOSPHATE 900 MG/50ML IV SOLN
900.0000 mg | INTRAVENOUS | Status: AC
  Administered 2019-12-08: 900 mg via INTRAVENOUS
  Filled 2019-12-07: qty 50

## 2019-12-07 MED ORDER — GENTAMICIN SULFATE 40 MG/ML IJ SOLN
5.0000 mg/kg | INTRAVENOUS | Status: AC
  Administered 2019-12-08: 422 mg via INTRAVENOUS
  Filled 2019-12-07 (×2): qty 10.5

## 2019-12-08 ENCOUNTER — Ambulatory Visit (HOSPITAL_COMMUNITY): Payer: Medicare Other | Admitting: Anesthesiology

## 2019-12-08 ENCOUNTER — Observation Stay (HOSPITAL_COMMUNITY)
Admission: RE | Admit: 2019-12-08 | Discharge: 2019-12-09 | Disposition: A | Discharge status: Home / Self Care | Payer: Medicare Other | Attending: Obstetrics & Gynecology | Admitting: Obstetrics & Gynecology

## 2019-12-08 ENCOUNTER — Encounter (HOSPITAL_COMMUNITY): Payer: Self-pay | Admitting: Obstetrics & Gynecology

## 2019-12-08 ENCOUNTER — Encounter (HOSPITAL_COMMUNITY)
Admission: RE | Disposition: A | Discharge status: Home / Self Care | Payer: Self-pay | Source: Home / Self Care | Attending: Obstetrics & Gynecology

## 2019-12-08 ENCOUNTER — Other Ambulatory Visit: Payer: Self-pay

## 2019-12-08 DIAGNOSIS — Z79899 Other long term (current) drug therapy: Secondary | ICD-10-CM | POA: Insufficient documentation

## 2019-12-08 DIAGNOSIS — Z8742 Personal history of other diseases of the female genital tract: Secondary | ICD-10-CM | POA: Diagnosis not present

## 2019-12-08 DIAGNOSIS — R1903 Right lower quadrant abdominal swelling, mass and lump: Secondary | ICD-10-CM | POA: Diagnosis not present

## 2019-12-08 DIAGNOSIS — Z91040 Latex allergy status: Secondary | ICD-10-CM | POA: Insufficient documentation

## 2019-12-08 DIAGNOSIS — F17210 Nicotine dependence, cigarettes, uncomplicated: Secondary | ICD-10-CM | POA: Insufficient documentation

## 2019-12-08 DIAGNOSIS — N8301 Follicular cyst of right ovary: Principal | ICD-10-CM | POA: Insufficient documentation

## 2019-12-08 DIAGNOSIS — R1031 Right lower quadrant pain: Secondary | ICD-10-CM | POA: Diagnosis not present

## 2019-12-08 DIAGNOSIS — N838 Other noninflammatory disorders of ovary, fallopian tube and broad ligament: Secondary | ICD-10-CM | POA: Diagnosis not present

## 2019-12-08 DIAGNOSIS — Z9889 Other specified postprocedural states: Secondary | ICD-10-CM

## 2019-12-08 DIAGNOSIS — N83291 Other ovarian cyst, right side: Secondary | ICD-10-CM | POA: Diagnosis not present

## 2019-12-08 DIAGNOSIS — Z5331 Laparoscopic surgical procedure converted to open procedure: Secondary | ICD-10-CM | POA: Diagnosis not present

## 2019-12-08 HISTORY — PX: SALPINGOOPHORECTOMY: SHX82

## 2019-12-08 HISTORY — PX: LAPAROSCOPIC BILATERAL SALPINGO OOPHERECTOMY: SHX5890

## 2019-12-08 SURGERY — SALPINGO-OOPHORECTOMY, BILATERAL, LAPAROSCOPIC
Anesthesia: General | Site: Abdomen | Laterality: Bilateral

## 2019-12-08 MED ORDER — LIDOCAINE HCL (CARDIAC) PF 50 MG/5ML IV SOSY
PREFILLED_SYRINGE | INTRAVENOUS | Status: DC | PRN
  Administered 2019-12-08: 100 mg via INTRAVENOUS

## 2019-12-08 MED ORDER — DEXMEDETOMIDINE (PRECEDEX) IN NS 20 MCG/5ML (4 MCG/ML) IV SYRINGE
PREFILLED_SYRINGE | INTRAVENOUS | Status: AC
  Filled 2019-12-08: qty 5

## 2019-12-08 MED ORDER — OXYCODONE HCL 5 MG PO TABS: 5.0000 mg | ORAL_TABLET | Freq: Once | ORAL | Status: DC | PRN

## 2019-12-08 MED ORDER — MENTHOL 3 MG MT LOZG: 1.0000 | LOZENGE | OROMUCOSAL | Status: DC | PRN

## 2019-12-08 MED ORDER — KETAMINE HCL 50 MG/5ML IJ SOSY
PREFILLED_SYRINGE | INTRAMUSCULAR | Status: AC
  Filled 2019-12-08: qty 5

## 2019-12-08 MED ORDER — ACETAMINOPHEN 10 MG/ML IV SOLN: 1000.0000 mg | Freq: Once | INTRAVENOUS | Status: DC | PRN

## 2019-12-08 MED ORDER — DOCUSATE SODIUM 100 MG PO CAPS
100.0000 mg | ORAL_CAPSULE | Freq: Two times a day (BID) | ORAL | Status: DC
  Administered 2019-12-08 – 2019-12-09 (×2): 100 mg via ORAL
  Filled 2019-12-08 (×2): qty 1

## 2019-12-08 MED ORDER — KETOROLAC TROMETHAMINE 30 MG/ML IJ SOLN
30.0000 mg | Freq: Four times a day (QID) | INTRAMUSCULAR | Status: DC
  Administered 2019-12-08 – 2019-12-09 (×3): 30 mg via INTRAVENOUS
  Filled 2019-12-08 (×3): qty 1

## 2019-12-08 MED ORDER — ARIPIPRAZOLE 10 MG PO TABS
20.0000 mg | ORAL_TABLET | Freq: Every day | ORAL | Status: DC
  Administered 2019-12-08: 20 mg via ORAL
  Filled 2019-12-08: qty 2

## 2019-12-08 MED ORDER — SODIUM CHLORIDE 0.9 % IV SOLN
8.0000 mg | Freq: Four times a day (QID) | INTRAVENOUS | Status: DC | PRN
  Filled 2019-12-08: qty 4

## 2019-12-08 MED ORDER — PROPOFOL 10 MG/ML IV BOLUS
INTRAVENOUS | Status: DC | PRN
  Administered 2019-12-08: 200 mg via INTRAVENOUS

## 2019-12-08 MED ORDER — DEXAMETHASONE SODIUM PHOSPHATE 10 MG/ML IJ SOLN
INTRAMUSCULAR | Status: DC | PRN
  Administered 2019-12-08: 10 mg via INTRAVENOUS

## 2019-12-08 MED ORDER — GLYCOPYRROLATE 0.2 MG/ML IJ SOLN
INTRAMUSCULAR | Status: DC | PRN
  Administered 2019-12-08: .2 mg via INTRAVENOUS

## 2019-12-08 MED ORDER — PANTOPRAZOLE SODIUM 40 MG PO TBEC
80.0000 mg | DELAYED_RELEASE_TABLET | Freq: Every day | ORAL | Status: DC
  Administered 2019-12-08: 80 mg via ORAL
  Filled 2019-12-08: qty 2

## 2019-12-08 MED ORDER — ROCURONIUM BROMIDE 10 MG/ML (PF) SYRINGE
PREFILLED_SYRINGE | INTRAVENOUS | Status: AC
  Filled 2019-12-08: qty 10

## 2019-12-08 MED ORDER — ORAL CARE MOUTH RINSE: 15.0000 mL | Freq: Once | OROMUCOSAL | Status: AC

## 2019-12-08 MED ORDER — KCL IN DEXTROSE-NACL 20-5-0.45 MEQ/L-%-% IV SOLN: INTRAVENOUS | Status: DC

## 2019-12-08 MED ORDER — ONDANSETRON HCL 4 MG/2ML IJ SOLN
INTRAMUSCULAR | Status: AC
  Filled 2019-12-08: qty 2

## 2019-12-08 MED ORDER — BISACODYL 10 MG RE SUPP: 10.0000 mg | Freq: Every day | RECTAL | Status: DC | PRN

## 2019-12-08 MED ORDER — ONDANSETRON HCL 4 MG/2ML IJ SOLN
INTRAMUSCULAR | Status: DC | PRN
  Administered 2019-12-08: 4 mg via INTRAVENOUS

## 2019-12-08 MED ORDER — MIDAZOLAM HCL 2 MG/2ML IJ SOLN
INTRAMUSCULAR | Status: AC
  Filled 2019-12-08: qty 2

## 2019-12-08 MED ORDER — CHLORHEXIDINE GLUCONATE 0.12 % MT SOLN
15.0000 mL | Freq: Once | OROMUCOSAL | Status: AC
  Administered 2019-12-08: 15 mL via OROMUCOSAL

## 2019-12-08 MED ORDER — SUGAMMADEX SODIUM 200 MG/2ML IV SOLN
INTRAVENOUS | Status: DC | PRN
  Administered 2019-12-08: 460 mg via INTRAVENOUS

## 2019-12-08 MED ORDER — ONDANSETRON HCL 4 MG/2ML IJ SOLN: 4.0000 mg | Freq: Once | INTRAMUSCULAR | Status: DC | PRN

## 2019-12-08 MED ORDER — SODIUM CHLORIDE 0.9 % IR SOLN
Status: DC | PRN
  Administered 2019-12-08 (×2): 1000 mL

## 2019-12-08 MED ORDER — OXYCODONE HCL 5 MG PO TABS
5.0000 mg | ORAL_TABLET | ORAL | Status: DC | PRN
  Administered 2019-12-08: 10 mg via ORAL
  Filled 2019-12-08: qty 2
  Filled 2019-12-08: qty 1

## 2019-12-08 MED ORDER — KETAMINE HCL 10 MG/ML IJ SOLN
INTRAMUSCULAR | Status: DC | PRN
  Administered 2019-12-08 (×2): 10 mg via INTRAVENOUS

## 2019-12-08 MED ORDER — FENTANYL CITRATE (PF) 100 MCG/2ML IJ SOLN
INTRAMUSCULAR | Status: AC
  Filled 2019-12-08: qty 2

## 2019-12-08 MED ORDER — LEVOFLOXACIN IN D5W 750 MG/150ML IV SOLN
750.0000 mg | Freq: Once | INTRAVENOUS | Status: AC
  Administered 2019-12-08: 750 mg via INTRAVENOUS
  Filled 2019-12-08: qty 150

## 2019-12-08 MED ORDER — OXYCODONE HCL 5 MG/5ML PO SOLN: 5.0000 mg | Freq: Once | ORAL | Status: DC | PRN

## 2019-12-08 MED ORDER — DIPHENHYDRAMINE HCL 50 MG/ML IJ SOLN: 25.0000 mg | Freq: Four times a day (QID) | INTRAMUSCULAR | Status: DC | PRN

## 2019-12-08 MED ORDER — ROCURONIUM 10MG/ML (10ML) SYRINGE FOR MEDFUSION PUMP - OPTIME
INTRAVENOUS | Status: DC | PRN
  Administered 2019-12-08: 50 mg via INTRAVENOUS
  Administered 2019-12-08: 10 mg via INTRAVENOUS
  Administered 2019-12-08: 50 mg via INTRAVENOUS
  Administered 2019-12-08 (×2): 20 mg via INTRAVENOUS
  Administered 2019-12-08: 50 mg via INTRAVENOUS

## 2019-12-08 MED ORDER — ONDANSETRON HCL 4 MG PO TABS: 8.0000 mg | ORAL_TABLET | Freq: Four times a day (QID) | ORAL | Status: DC | PRN

## 2019-12-08 MED ORDER — ENOXAPARIN SODIUM 40 MG/0.4ML ~~LOC~~ SOLN
40.0000 mg | SUBCUTANEOUS | Status: DC
  Administered 2019-12-09: 40 mg via SUBCUTANEOUS
  Filled 2019-12-08: qty 0.4

## 2019-12-08 MED ORDER — LIDOCAINE 2% (20 MG/ML) 5 ML SYRINGE
INTRAMUSCULAR | Status: AC
  Filled 2019-12-08: qty 5

## 2019-12-08 MED ORDER — ESCITALOPRAM OXALATE 10 MG PO TABS
20.0000 mg | ORAL_TABLET | Freq: Every day | ORAL | Status: DC
  Administered 2019-12-08 – 2019-12-09 (×2): 20 mg via ORAL
  Filled 2019-12-08 (×2): qty 2

## 2019-12-08 MED ORDER — EPHEDRINE SULFATE 50 MG/ML IJ SOLN
INTRAMUSCULAR | Status: DC | PRN
  Administered 2019-12-08: 10 mg via INTRAVENOUS
  Administered 2019-12-08: 5 mg via INTRAVENOUS
  Administered 2019-12-08: 10 mg via INTRAVENOUS

## 2019-12-08 MED ORDER — MIDAZOLAM HCL 5 MG/5ML IJ SOLN
INTRAMUSCULAR | Status: DC | PRN
  Administered 2019-12-08: 2 mg via INTRAVENOUS

## 2019-12-08 MED ORDER — GABAPENTIN 300 MG PO CAPS
300.0000 mg | ORAL_CAPSULE | Freq: Three times a day (TID) | ORAL | Status: DC
  Administered 2019-12-08 – 2019-12-09 (×2): 300 mg via ORAL
  Filled 2019-12-08 (×2): qty 1

## 2019-12-08 MED ORDER — MEPERIDINE HCL 50 MG/ML IJ SOLN: 6.2500 mg | INTRAMUSCULAR | Status: DC | PRN

## 2019-12-08 MED ORDER — LACTATED RINGERS IV SOLN: INTRAVENOUS | Status: DC | PRN

## 2019-12-08 MED ORDER — DROPERIDOL 2.5 MG/ML IJ SOLN: 0.6250 mg | Freq: Once | INTRAMUSCULAR | Status: DC | PRN

## 2019-12-08 MED ORDER — ALBUTEROL SULFATE (2.5 MG/3ML) 0.083% IN NEBU: 3.0000 mL | INHALATION_SOLUTION | Freq: Four times a day (QID) | RESPIRATORY_TRACT | Status: DC | PRN

## 2019-12-08 MED ORDER — BUPIVACAINE LIPOSOME 1.3 % IJ SUSP: 20.0000 mL | Freq: Once | INTRAMUSCULAR | Status: DC

## 2019-12-08 MED ORDER — POVIDONE-IODINE 10 % EX SWAB: 2.0000 "application " | Freq: Once | CUTANEOUS | Status: DC

## 2019-12-08 MED ORDER — FENTANYL CITRATE (PF) 100 MCG/2ML IJ SOLN: 50.0000 ug | INTRAMUSCULAR | Status: DC | PRN

## 2019-12-08 MED ORDER — SUGAMMADEX SODIUM 500 MG/5ML IV SOLN
INTRAVENOUS | Status: AC
  Filled 2019-12-08: qty 5

## 2019-12-08 MED ORDER — PROPOFOL 10 MG/ML IV BOLUS
INTRAVENOUS | Status: AC
  Filled 2019-12-08: qty 40

## 2019-12-08 MED ORDER — DULOXETINE HCL 30 MG PO CPEP
30.0000 mg | ORAL_CAPSULE | Freq: Every day | ORAL | Status: DC
  Administered 2019-12-08 – 2019-12-09 (×2): 30 mg via ORAL
  Filled 2019-12-08 (×2): qty 1

## 2019-12-08 MED ORDER — PROMETHAZINE HCL 25 MG/ML IJ SOLN: 25.0000 mg | Freq: Four times a day (QID) | INTRAMUSCULAR | Status: DC | PRN

## 2019-12-08 MED ORDER — BUPIVACAINE LIPOSOME 1.3 % IJ SUSP
INTRAMUSCULAR | Status: DC | PRN
  Administered 2019-12-08: 20 mL

## 2019-12-08 MED ORDER — FENTANYL CITRATE (PF) 100 MCG/2ML IJ SOLN
INTRAMUSCULAR | Status: DC | PRN
Start: 2019-12-08 — End: 2019-12-08
  Administered 2019-12-08: 50 ug via INTRAVENOUS
  Administered 2019-12-08: 100 ug via INTRAVENOUS
  Administered 2019-12-08: 50 ug via INTRAVENOUS
  Administered 2019-12-08: 100 ug via INTRAVENOUS

## 2019-12-08 MED ORDER — DEXMEDETOMIDINE HCL 200 MCG/2ML IV SOLN
INTRAVENOUS | Status: DC | PRN
  Administered 2019-12-08: 20 ug via INTRAVENOUS

## 2019-12-08 MED ORDER — SENNOSIDES-DOCUSATE SODIUM 8.6-50 MG PO TABS: 1.0000 | ORAL_TABLET | Freq: Every evening | ORAL | Status: DC | PRN

## 2019-12-08 MED ORDER — ALPRAZOLAM 0.5 MG PO TABS
0.7500 mg | ORAL_TABLET | Freq: Every day | ORAL | Status: DC
  Administered 2019-12-08: 0.75 mg via ORAL
  Filled 2019-12-08: qty 1

## 2019-12-08 MED ORDER — BUPIVACAINE LIPOSOME 1.3 % IJ SUSP
INTRAMUSCULAR | Status: AC
  Filled 2019-12-08: qty 20

## 2019-12-08 MED ORDER — ALUM & MAG HYDROXIDE-SIMETH 200-200-20 MG/5ML PO SUSP: 30.0000 mL | ORAL | Status: DC | PRN

## 2019-12-08 MED ORDER — LACTATED RINGERS IV SOLN: INTRAVENOUS | Status: DC

## 2019-12-08 MED ORDER — HYDROMORPHONE HCL 1 MG/ML IJ SOLN
0.2500 mg | INTRAMUSCULAR | Status: DC | PRN
  Administered 2019-12-08: 0.5 mg via INTRAVENOUS
  Filled 2019-12-08: qty 0.5

## 2019-12-08 MED ORDER — IBUPROFEN 800 MG PO TABS: 800.0000 mg | ORAL_TABLET | Freq: Four times a day (QID) | ORAL | Status: DC

## 2019-12-08 MED ORDER — KETOROLAC TROMETHAMINE 30 MG/ML IJ SOLN
30.0000 mg | Freq: Once | INTRAMUSCULAR | Status: AC
  Administered 2019-12-08: 30 mg via INTRAVENOUS
  Filled 2019-12-08: qty 1

## 2019-12-08 SURGICAL SUPPLY — 69 items
BAG DECANTER FOR FLEXI CONT (MISCELLANEOUS) ×3 IMPLANT
BAG HAMPER (MISCELLANEOUS) ×3 IMPLANT
BAG RETRIEVAL 10 (BASKET) ×1
BAG RETRIEVAL 10MM (BASKET) ×1
BLADE SURG SZ11 CARB STEEL (BLADE) ×3 IMPLANT
CATH FOLEY LATEX FREE 16FR (CATHETERS) ×3
CATH FOLEY LF 16FR (CATHETERS) ×1 IMPLANT
CATH ROBINSON RED A/P 16FR (CATHETERS) ×3 IMPLANT
CLOTH BEACON ORANGE TIMEOUT ST (SAFETY) ×3 IMPLANT
COVER LIGHT HANDLE STERIS (MISCELLANEOUS) ×6 IMPLANT
COVER WAND RF STERILE (DRAPES) ×3 IMPLANT
DERMABOND ADVANCED (GAUZE/BANDAGES/DRESSINGS) ×6
DERMABOND ADVANCED .7 DNX12 (GAUZE/BANDAGES/DRESSINGS) ×3 IMPLANT
DRSG OPSITE POSTOP 4X10 (GAUZE/BANDAGES/DRESSINGS) ×3 IMPLANT
ELECT REM PT RETURN 9FT ADLT (ELECTROSURGICAL) ×3
ELECTRODE REM PT RTRN 9FT ADLT (ELECTROSURGICAL) ×1 IMPLANT
FILTER SMOKE EVAC LAPAROSHD (FILTER) ×3 IMPLANT
GAUZE 4X4 16PLY RFD (DISPOSABLE) ×6 IMPLANT
GLOVE BIOGEL PI IND STRL 7.0 (GLOVE) ×5 IMPLANT
GLOVE BIOGEL PI IND STRL 8 (GLOVE) ×1 IMPLANT
GLOVE BIOGEL PI INDICATOR 7.0 (GLOVE) ×10
GLOVE BIOGEL PI INDICATOR 8 (GLOVE) ×2
GLOVE ECLIPSE 8.0 STRL XLNG CF (GLOVE) ×3 IMPLANT
GLOVE SKINSENSE NS SZ8.0 LF (GLOVE) ×2
GLOVE SKINSENSE STRL SZ8.0 LF (GLOVE) ×1 IMPLANT
GLOVE SURG SS PI 6.5 STRL IVOR (GLOVE) ×3 IMPLANT
GLOVE SURG SS PI 7.0 STRL IVOR (GLOVE) ×3 IMPLANT
GOWN STRL REUS W/TWL LRG LVL3 (GOWN DISPOSABLE) ×6 IMPLANT
GOWN STRL REUS W/TWL XL LVL3 (GOWN DISPOSABLE) ×3 IMPLANT
HEMOSTAT ARISTA ABSORB 3G PWDR (HEMOSTASIS) ×3 IMPLANT
INST SET LAPROSCOPIC GYN AP (KITS) ×3 IMPLANT
IV NS IRRIG 3000ML ARTHROMATIC (IV SOLUTION) ×3 IMPLANT
KIT TURNOVER KIT A (KITS) ×3 IMPLANT
MANIFOLD NEPTUNE II (INSTRUMENTS) ×3 IMPLANT
NEEDLE HYPO 18GX1.5 BLUNT FILL (NEEDLE) ×3 IMPLANT
NEEDLE HYPO 21X1.5 SAFETY (NEEDLE) ×6 IMPLANT
NEEDLE INSUFFLATION 120MM (ENDOMECHANICALS) ×3 IMPLANT
PACK PERI GYN (CUSTOM PROCEDURE TRAY) ×3 IMPLANT
PAD ARMBOARD 7.5X6 YLW CONV (MISCELLANEOUS) ×3 IMPLANT
RETRACTOR WND ALEXIS-O 25 LRG (MISCELLANEOUS) ×1 IMPLANT
RTRCTR WOUND ALEXIS O 25CM LRG (MISCELLANEOUS) ×3
SET BASIN LINEN APH (SET/KITS/TRAYS/PACK) ×3 IMPLANT
SET TUBE IRRIG SUCTION NO TIP (IRRIGATION / IRRIGATOR) ×3 IMPLANT
SET TUBE SMOKE EVAC HIGH FLOW (TUBING) ×3 IMPLANT
SHEARS HARMONIC ACE PLUS 36CM (ENDOMECHANICALS) ×3 IMPLANT
SLEEVE ENDOPATH XCEL 5M (ENDOMECHANICALS) ×3 IMPLANT
SOL ANTI FOG 6CC (MISCELLANEOUS) ×1 IMPLANT
SOLUTION ANTI FOG 6CC (MISCELLANEOUS) ×2
SPONGE GAUZE 2X2 8PLY STER LF (GAUZE/BANDAGES/DRESSINGS) ×3
SPONGE GAUZE 2X2 8PLY STRL LF (GAUZE/BANDAGES/DRESSINGS) ×6 IMPLANT
SPONGE LAP 18X18 RF (DISPOSABLE) ×15 IMPLANT
SUT CHROMIC 0 CT 1 (SUTURE) ×3 IMPLANT
SUT VIC AB 0 CT1 27 (SUTURE) ×6
SUT VIC AB 0 CT1 27XBRD ANTBC (SUTURE) ×1 IMPLANT
SUT VIC AB 0 CT1 27XCR 8 STRN (SUTURE) ×1 IMPLANT
SUT VIC AB 0 CTX 36 (SUTURE) ×3
SUT VIC AB 0 CTX36XBRD ANTBCTR (SUTURE) ×1 IMPLANT
SUT VICRYL 0 UR6 27IN ABS (SUTURE) ×6 IMPLANT
SUT VICRYL 3 0 (SUTURE) ×3 IMPLANT
SUT VICRYL AB 3-0 FS1 BRD 27IN (SUTURE) ×6 IMPLANT
SYR 20ML LL LF (SYRINGE) ×6 IMPLANT
SYS BAG RETRIEVAL 10MM (BASKET) ×1
SYSTEM BAG RETRIEVAL 10MM (BASKET) ×1 IMPLANT
TRAY FOL W/BAG SLVR 16FR STRL (SET/KITS/TRAYS/PACK) ×1 IMPLANT
TRAY FOLEY W/BAG SLVR 16FR LF (SET/KITS/TRAYS/PACK) ×3
TROCAR ENDO BLADELESS 11MM (ENDOMECHANICALS) ×3 IMPLANT
TROCAR XCEL NON-BLD 5MMX100MML (ENDOMECHANICALS) ×3 IMPLANT
TUBING EVAC SMOKE HEATED PNEUM (TUBING) ×3 IMPLANT
WARMER LAPAROSCOPE (MISCELLANEOUS) ×3 IMPLANT
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
## 2019-12-08 NOTE — Op Note (Addendum)
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 The right ovary was extremely difficult to locate and I specifically scrubbed out of surgery and reviewed scans with Dr. Darcella Cheshire.  I could never find it laparoscopicall and had to resort to a laprotomy and even then I never really saw it, could only feel it under the vagina and plastered to the right pelvic sidewall.  Description of operation:  Patient was taken the operating room where she was placed in the supine position She underwent general endotracheal anesthesia She was placed in low lithotomy position She was prepped and draped for laparoscopic procedure Preoperative plan was for laparoscopic removal of both tubes and ovaries with her history of endometriosis and ongoing pelvic pain Incision was made in the umbilicus The fascia was grasped A varies needle was used and placed to the peritoneal cavity without difficulty The peritoneal cavity was insufflated An 11 mm blunt video laparoscope trocar was placed to the peritoneal cavity with 1 pass light difficulty The peritoneal cavity was confirmed There were a large amount of adhesions to the anterior abdominal wall the omentum Identified a free space in  the left lower quadrant made an incision and placed a 5 mm trocar without difficulty The harmonic scalpel was then used to take down the adhesions I was then able to place a 5 mm trocar in the right lower quadrant under direct visualization The left ovary was densely adherent to the pelvic sidewall the corner of the vagina and bladder and the sigmoid colon Careful dissection was performed The harmonic scalpel was employed The left ovary and tube were removed A great deal of time greater than an hour was spent trying to locate the right ovary There were a lot of adhesions I took the dissection all the way down to the pelvic sidewall in the vagina I was able to locate the ureter throughout its entire length in the pelvis as it came over the pelvic brim and was able to avoid any injury of the ureter or the bladder However I was never able to locate the ovary I scrubbed out of the case at this point and then went and reviewed the scans again with Dr. Darcella Cheshire to try to get [place] close approximation as I could of the right ovary and to make sure that indeed it was they are I had viewed the scans preoperatively but want to view them with the radiologist personally in order to give me the best chance to located I came back and  is spent another great amount of time trying to located never could I knew it was they are so I decided to proceed with a mini lap incision Placed the patient in supine position She was reprepped and draped A mini lap incision was made carried down sharply to the rectus fascia Rectus fascia was incised taken off the rectus muscle superiorly and inferiorly The muscles were divided the peritoneal cavity was entered An Alexis self-retaining retractor was placed I then spent probably another 30 to 45 minutes doing manual feeling and dissection and was finally able to locate what I thought was the right ovary underneath the bladder plastered deep in the pelvis against the sidewall really  in the cul-de-sac I had previously and gotten the infundibulopelvic ligament vasculature I was sure that had gotten the vasculature previously and so was able to just manually dissect the ovary from basically the right pelvic cul-de-sac where it was densely adherent This was hemostatic All the specimen was sent to pathology for evaluation Pelvis was irrigated vigorously The muscles and peritoneum were reapproximated loosely The fascia was closed using 0 Vicryl running Subcutaneous tissue was made hemostatic and irrigated A subcuticular skin closure was performed 266 mg of Exparel was injected into the subcutaneous tissue Both lower quadrant incisions and umbilical incisions had previously been closed Blood loss for the procedure was 250 cc Patient received gentamicin clindamycin preoperatively prophylactically She was awakened from anesthesia taken recovery in good stable condition all counts correct x3     Florian Buff, MD 12/08/2019 3:56 PM

## 2019-12-08 NOTE — Anesthesia Procedure Notes (Signed)
Procedure Name: Intubation Date/Time: 12/08/2019 11:47 AM Performed by: Jonna Munro, CRNA Pre-anesthesia Checklist: Patient identified, Emergency Drugs available, Suction available, Patient being monitored and Timeout performed Patient Re-evaluated:Patient Re-evaluated prior to induction Oxygen Delivery Method: Circle system utilized Preoxygenation: Pre-oxygenation with 100% oxygen Induction Type: IV induction Ventilation: Mask ventilation without difficulty Laryngoscope Size: Mac and 3 Grade View: Grade I Tube type: Oral Number of attempts: 1 Airway Equipment and Method: Stylet Placement Confirmation: ETT inserted through vocal cords under direct vision,  positive ETCO2 and breath sounds checked- equal and bilateral Secured at: 23 cm Tube secured with: Tape Dental Injury: Teeth and Oropharynx as per pre-operative assessment

## 2019-12-08 NOTE — Transfer of Care (Signed)
Immediate Anesthesia Transfer of Care Note  Patient: Shelby [name]  Procedure(s) Performed: ATTEMPTED LAPAROSCOPIC BILATERAL SALPINGO OOPHORECTOMY (Bilateral Abdomen) OPEN SALPINGO OOPHORECTOMY (Bilateral Abdomen)  Patient Location: PACU  Anesthesia Type:General  Level of Consciousness: awake  Airway & Oxygen Therapy: Patient Spontanous Breathing  Post-op Assessment: Report given to RN  Post vital signs: Reviewed  Last Vitals:  Vitals Value Taken Time  BP 125/82 12/08/19 1615  Temp    Pulse    Resp 16 12/08/19 1622  SpO2    Vitals shown include unvalidated device data.  Last Pain:  Vitals:   12/08/19 0953  TempSrc: Oral  PainSc: 0-No pain      Patients Stated Pain Goal: 8 (54/98/26 4158)  Complications: No complications documented.

## 2019-12-08 NOTE — Anesthesia Preprocedure Evaluation (Signed)
Anesthesia Evaluation   Patient awake    Reviewed: Allergy & Precautions, NPO status , Patient's Chart, lab work & pertinent test results, reviewed documented beta blocker date and time   Airway Mallampati: II  TM Distance: >3 FB Neck ROM: Full    Dental no notable dental hx.    Pulmonary asthma , sleep apnea and Oxygen sleep apnea , Current Smoker and Patient abstained from smoking.,    Pulmonary exam normal breath sounds clear to auscultation       Cardiovascular Normal cardiovascular exam Rhythm:Regular Rate:Normal     Neuro/Psych  Headaches, Anxiety Depression Bipolar Disorder    GI/Hepatic GERD  ,  Endo/Other    Renal/GU Renal disease     Musculoskeletal  (+) Arthritis , Osteoarthritis,    Abdominal   Peds  Hematology  (+) anemia ,   Anesthesia Other Findings   Reproductive/Obstetrics                             Anesthesia Physical Anesthesia Plan  ASA: II  Anesthesia Plan: General   Post-op Pain Management:    Induction: Intravenous  PONV Risk Score and Plan:   Airway Management Planned: Oral ETT  Additional Equipment:   Intra-op Plan:   Post-operative Plan: Extubation in OR  Informed Consent: I have reviewed the patients History and Physical, chart, labs and discussed the procedure including the risks, benefits and alternatives for the proposed anesthesia with the patient or authorized representative who has indicated his/her understanding and acceptance.     Dental advisory given  Plan Discussed with: CRNA  Anesthesia Plan Comments:         Anesthesia Quick Evaluation

## 2019-12-08 NOTE — Anesthesia Postprocedure Evaluation (Signed)
Anesthesia Post Note  Patient: Shelby [name]  Procedure(s) Performed: ATTEMPTED LAPAROSCOPIC BILATERAL SALPINGO OOPHORECTOMY (Bilateral Abdomen) OPEN SALPINGO OOPHORECTOMY (Bilateral Abdomen)  Patient location during evaluation: PACU Anesthesia Type: General Level of consciousness: awake and alert Pain management: pain level controlled Vital Signs Assessment: post-procedure vital signs reviewed and stable Respiratory status: spontaneous breathing, nonlabored ventilation, respiratory function stable and patient connected to nasal cannula oxygen Cardiovascular status: blood pressure returned to baseline and stable Postop Assessment: no apparent nausea or vomiting Anesthetic complications: no   No complications documented.   Last Vitals:  Vitals:   12/08/19 1630 12/08/19 1645  BP: 119/69 118/73  Pulse: 63 74  Resp: 13 12  Temp:    SpO2: 100% 93%    Last Pain:  Vitals:   12/08/19 1640  TempSrc:   PainSc: 5                  Raji P Pricilla Handler

## 2019-12-09 DIAGNOSIS — N8301 Follicular cyst of right ovary: Secondary | ICD-10-CM | POA: Diagnosis not present

## 2019-12-09 LAB — CBC
HCT: 39.1 % (ref 36.0–46.0)
Hemoglobin: 12.6 g/dL (ref 12.0–15.0)
MCH: 29.1 pg (ref 26.0–34.0)
MCHC: 32.2 g/dL (ref 30.0–36.0)
MCV: 90.3 fL (ref 80.0–100.0)
Platelets: 331 10*3/uL (ref 150–400)
RBC: 4.33 MIL/uL (ref 3.87–5.11)
RDW: 13.2 % (ref 11.5–15.5)
WBC: 16.6 10*3/uL — ABNORMAL HIGH (ref 4.0–10.5)
nRBC: 0 % (ref 0.0–0.2)

## 2019-12-09 LAB — BASIC METABOLIC PANEL
Anion gap: 9 (ref 5–15)
BUN: 6 mg/dL (ref 6–20)
CO2: 25 mmol/L (ref 22–32)
Calcium: 8.1 mg/dL — ABNORMAL LOW (ref 8.9–10.3)
Chloride: 102 mmol/L (ref 98–111)
Creatinine, Ser: 0.66 mg/dL (ref 0.44–1.00)
GFR, Estimated: >60 mL/min (ref >60)
Glucose, Bld: 153 mg/dL — ABNORMAL HIGH (ref 70–99)
Potassium: 3.9 mmol/L (ref 3.5–5.1)
Sodium: 136 mmol/L (ref 135–145)

## 2019-12-09 MED ORDER — OXYCODONE HCL 5 MG PO TABS: 5.0000 mg | ORAL_TABLET | ORAL | 0 refills | Status: DC | PRN

## 2019-12-09 MED ORDER — IBUPROFEN 800 MG PO TABS
800.0000 mg | ORAL_TABLET | Freq: Four times a day (QID) | ORAL | 0 refills | Status: DC
Start: 2019-12-09 — End: 2019-12-17

## 2019-12-09 MED ORDER — ONDANSETRON HCL 8 MG PO TABS
8.0000 mg | ORAL_TABLET | Freq: Four times a day (QID) | ORAL | 0 refills | Status: DC | PRN
Start: 2019-12-09 — End: 2021-12-18

## 2019-12-09 NOTE — Addendum Note (Signed)
Addendum  created 12/09/19 0723 by Orlie Dakin, CRNA   Charge Capture section accepted

## 2019-12-09 NOTE — Progress Notes (Signed)
Pt ambulated in hall with front wheel walker.  Walked from room to nurses station.  Tolerated well.

## 2019-12-09 NOTE — Discharge Instructions (Signed)
Exploratory Laparotomy, Adult, Care After °This sheet gives you information about how to care for yourself after your procedure. Your health care provider may also give you more specific instructions. If you have problems or questions, contact your health care provider. °What can I expect after the procedure? °After the procedure, it is common to have: °· Abdominal soreness. °· Fatigue. °· A sore throat from the tube in your throat. °· A lack of appetite. °Follow these instructions at home: °Medicines °· Take over-the-counter and prescription medicines only as told by your health care provider. °· If you were prescribed an antibiotic medicine, take it as told by your health care provider. Do not stop taking the antibiotic even if you start to feel better. °· Do not drive or operate heavy machinery while taking pain medicine. °· If you are taking prescription pain medicine, take actions to prevent or treat constipation. Your health care provider may recommend that you: °? Drink enough fluid to keep your urine pale yellow. °? Eat foods that are high in fiber, such as fresh fruits and vegetables, whole grains, and beans. °? Limit foods that are high in fat and processed sugars, such as fried or sweet foods. °? Take an over-the-counter or prescription medicine for constipation. Undergoing surgery and taking pain medicines can make constipation worse. °Incision care ° °· Follow instructions from your health care provider about how to take care of your incision. Make sure you: °? Wash your hands with soap and water before you change your bandage (dressing). If soap and water are not available, use hand sanitizer. °? Change your dressing as told by your health care provider. °? Leave stitches (sutures), skin glue, or adhesive strips in place. These skin closures may need to stay in place for 2 weeks or longer. If adhesive strip edges start to loosen and curl up, you may trim the loose edges. Do not remove adhesive strips  completely unless your health care provider tells you to do that. °· If you were sent home with a drain, follow instructions from your health care provider about how to care for it. °· Check your incision area every day for signs of infection. Check for: °? Redness, swelling, or pain. °? Fluid or blood. °? Warmth. °? Pus or a bad smell. °Activity ° °· Rest as told by your health care provider. °? Avoid sitting for a long time without moving. Get up to take short walks every 1-2 hours. This is important to improve blood flow and breathing. Ask for help if you feel weak or unsteady. °· Do not lift anything that is heavier than 5 lb (2.2 kg), or the limit that your health care provider tells you, until he or she says that it is safe. °· Ask your health care provider when you can start to do your usual activities again, such as driving, going back to work, and having sex. °Eating and drinking °· You may eat what you usually eat. Include lots of whole grains, fruits, and vegetables in your diet. This will help to prevent constipation. °· Drink enough fluid to keep your urine pale yellow. °Bathing °· Keep your incision clean and dry. Clean it as often as told by your health care provider: °? Gently wash the incision with soap and water. °? Rinse the incision with water to remove all soap. °? Pat the incision dry with a clean towel. Do not rub the incision. °· You may take showers after 48 hours. °· Do not take baths,   swim, or use a hot tub until your health care provider says it is okay to do so. General instructions  Do not use any products that contain nicotine or tobacco, such as cigarettes and e-cigarettes. These can delay healing after surgery. If you need help quitting, ask your health care provider.  Wear compression stockings as told by your health care provider. These stockings help to prevent blood clots and reduce swelling in your legs.  Keep all follow-up visits as told by your health care provider.  This is important. Contact a health care provider if:  You have a fever.  You have chills.  Your pain medicine is not helping.  You have constipation or diarrhea.  You have nausea or vomiting.  You have drainage, redness, swelling, or pain at your incision site. Get help right away if:  Your pain is getting worse.  You have not had a bowel movement for more than 3 days.  You have ongoing (persistent) vomiting.  The edges of your incision open up.  You have warmth, tenderness, and swelling in your calf.  You have trouble breathing.  You have chest pain. These symptoms may represent a serious problem that is an emergency. Do not wait to see if the symptoms will go away. Get medical help right away. Call your local emergency services (911 in the [place]). Do not drive yourself to the hospital. Summary  Abdominal soreness is common after exploratory laparotomy. Take over-the-counter and prescription pain medicines only as told by your health care provider.  Follow instructions from your health care provider about how to take care of your incision. Do not take baths, swim, or use a hot tub until your health care provider says it is okay to do so.  Watch for signs and symptoms of infection after surgery, including fever, chills, drainage from your incision, and worsening abdominal pain. This information is not intended to replace advice given to you by your health care provider. Make sure you discuss any questions you have with your health care provider. Document Revised: 03/23/2018 Document Reviewed: 02/07/2017 Elsevier Patient Education  2020 [employer].

## 2019-12-09 NOTE — Discharge Summary (Signed)
Physician Discharge Summary  Patient ID: Shelby [name] MRN: [id number] DOB/AGE: 1976/01/27 44 y.o.  Admit date: 12/08/2019 Discharge date: 12/09/2019  Admission Diagnoses:exp lap with BSO  Discharge Diagnoses:  Active Problems:   S/P exploratory laparotomy   Discharged Condition: good  Hospital Course: unremarkable post op course  Consults: None  Significant Diagnostic Studies: labs:   Results for orders placed or performed during the hospital encounter of 12/08/19 (from the past 24 hour(s))  CBC     Status: Abnormal   Collection Time: 12/09/19  5:45 AM  Result Value Ref Range   WBC 16.6 (H) 4.0 - 10.5 K/uL   RBC 4.33 3.87 - 5.11 MIL/uL   Hemoglobin 12.6 12.0 - 15.0 g/dL   HCT 39.1 36 - 46 %   MCV 90.3 80.0 - 100.0 fL   MCH 29.1 26.0 - 34.0 pg   MCHC 32.2 30.0 - 36.0 g/dL   RDW 13.2 11.5 - 15.5 %   Platelets 331 150 - 400 K/uL   nRBC 0.0 0.0 - 0.2 %  Basic metabolic panel     Status: Abnormal   Collection Time: 12/09/19  5:45 AM  Result Value Ref Range   Sodium 136 135 - 145 mmol/L   Potassium 3.9 3.5 - 5.1 mmol/L   Chloride 102 98 - 111 mmol/L   CO2 25 22 - 32 mmol/L   Glucose, Bld 153 (H) 70 - 99 mg/dL   BUN 6 6 - 20 mg/dL   Creatinine, Ser 0.66 0.44 - 1.00 mg/dL   Calcium 8.1 (L) 8.9 - 10.3 mg/dL   GFR, Estimated >60 >60 mL/min   Anion gap 9 5 - 15    Treatments: surgery: BSO  Discharge Exam: Blood pressure 107/62, pulse 63, temperature 97.7 F (36.5 C), temperature source Oral, resp. rate 16, last menstrual period 05/12/2016, SpO2 92 %. General appearance: alert, cooperative and no distress GI: soft, non-tender; bowel sounds normal; no masses,  no organomegaly Incision/Wound:clean dry intact   Disposition:    Allergies as of 12/09/2019      Reactions   Chantix [varenicline] Other (See Comments)   Nightmares   Penicillins Other (See Comments)   Unknown Has patient had a PCN reaction causing immediate rash, facial/tongue/throat swelling, SOB  or lightheadedness with hypotension: Unknown Has patient had a PCN reaction causing severe rash involving mucus membranes or skin necrosis: Unknown Has patient had a PCN reaction that required hospitalization: Unknown Has patient had a PCN reaction occurring within the last 10 years: No If all of the above answers are "NO", then may proceed with Cephalosporin use.   Latex Rash      Medication List    TAKE these medications   albuterol 108 (90 Base) MCG/ACT inhaler Commonly known as: VENTOLIN HFA INHALE 2 PUFFS INTO THE LUNGS EVERY 6 HOURS AS NEEDED FOR WHEEZING OR SHORTNESS OF BREATH What changed:   how much to take  how to take this  when to take this  reasons to take this  additional instructions   ALPRAZolam 0.5 MG tablet Commonly known as: XANAX Take 1.5 tablets (0.75 mg total) by mouth at bedtime.   ARIPiprazole 20 MG tablet Commonly known as: ABILIFY Take 1 tablet (20 mg total) by mouth at bedtime. (Needs to be seen before next refill)   DULoxetine 30 MG capsule Commonly known as: CYMBALTA Take 1 capsule (30 mg total) by mouth daily.   escitalopram 20 MG tablet Commonly known as: LEXAPRO Take 20 mg by mouth daily.  esomeprazole 40 MG capsule Commonly known as: NexIUM Take 1 capsule (40 mg total) by mouth 2 (two) times daily before a meal. What changed: when to take this   ibuprofen 800 MG tablet Commonly known as: ADVIL Take 1 tablet (800 mg total) by mouth every 6 (six) hours.   ondansetron 8 MG tablet Commonly known as: ZOFRAN Take 1 tablet (8 mg total) by mouth every 6 (six) hours as needed for nausea.   oxyCODONE 5 MG immediate release tablet Commonly known as: Oxy IR/ROXICODONE Take 1-2 tablets (5-10 mg total) by mouth every 4 (four) hours as needed for moderate pain.       Follow-up Information    Florian Buff, MD Follow up on 12/17/2019.   Specialties: Obstetrics and Gynecology, Radiology Why: post op  Contact information: [place] [id number] [phone]               Signed: Florian Buff 12/09/2019, 11:57 AM

## 2019-12-10 ENCOUNTER — Encounter (HOSPITAL_COMMUNITY): Payer: Self-pay | Admitting: Obstetrics & Gynecology

## 2019-12-10 LAB — SURGICAL PATHOLOGY

## 2019-12-16 ENCOUNTER — Telehealth: Payer: Self-pay | Admitting: Obstetrics & Gynecology

## 2019-12-16 NOTE — Telephone Encounter (Signed)
Called patient back states that she is totally out of percocet. Advised that she can take ibuprofen as well, patient said it doesn't work. I informed her Dr. Elonda Husky is out of office today so patient will have to ask at her appointment tomorrow.

## 2019-12-16 NOTE — Telephone Encounter (Signed)
Patient called stating that she is needing a refill of her pain medication. Pt states that she has surgery recently, pt states she knows she has an appointment with him tomorrow but she is in a lot of pain. Please contact tp

## 2019-12-17 ENCOUNTER — Encounter: Payer: Self-pay | Admitting: Obstetrics & Gynecology

## 2019-12-17 ENCOUNTER — Ambulatory Visit (INDEPENDENT_AMBULATORY_CARE_PROVIDER_SITE_OTHER): Payer: Medicare Other | Admitting: Obstetrics & Gynecology

## 2019-12-17 VITALS — BP 111/79 | HR 93 | Ht 68.0 in | Wt 232.5 lb

## 2019-12-17 DIAGNOSIS — Z9889 Other specified postprocedural states: Secondary | ICD-10-CM

## 2019-12-17 MED ORDER — OXYCODONE HCL 5 MG PO TABS: 5.0000 mg | ORAL_TABLET | ORAL | 0 refills | Status: DC | PRN

## 2019-12-17 MED ORDER — ESTRADIOL 2 MG PO TABS: 2.0000 mg | ORAL_TABLET | Freq: Every day | ORAL | 11 refills | Status: DC

## 2019-12-17 NOTE — Addendum Note (Signed)
Addended by: Florian Buff on: 12/17/2019 10:33 AM   Modules accepted: Orders

## 2019-12-17 NOTE — Progress Notes (Signed)
  HPI: Patient returns for routine postoperative follow-up having undergone BSO, laparoscopic, laparotomy on 12/08/19.  The patient's immediate postoperative recovery has been unremarkable. Since hospital discharge the patient reports incisional pain.   Current Outpatient Medications: albuterol (VENTOLIN HFA) 108 (90 Base) MCG/ACT inhaler, INHALE 2 PUFFS INTO THE LUNGS EVERY 6 HOURS AS NEEDED FOR WHEEZING OR SHORTNESS OF BREATH (Patient taking differently: Inhale 2 puffs into the lungs every 6 (six) hours as needed for wheezing or shortness of breath. ), Disp: 54 g, Rfl: 1 ALPRAZolam (XANAX) 0.5 MG tablet, Take 1.5 tablets (0.75 mg total) by mouth at bedtime., Disp: 45 tablet, Rfl: 5 ARIPiprazole (ABILIFY) 20 MG tablet, Take 1 tablet (20 mg total) by mouth at bedtime. (Needs to be seen before next refill), Disp: 30 tablet, Rfl: 5 DULoxetine (CYMBALTA) 30 MG capsule, Take 1 capsule (30 mg total) by mouth daily., Disp: 90 capsule, Rfl: 1 esomeprazole (NEXIUM) 40 MG capsule, Take 1 capsule (40 mg total) by mouth 2 (two) times daily before a meal. (Patient taking differently: Take 40 mg by mouth daily. ), Disp: 180 capsule, Rfl: 3 ibuprofen (ADVIL) 200 MG tablet, Take 800 mg by mouth as needed., Disp: , Rfl:  ondansetron (ZOFRAN) 8 MG tablet, Take 1 tablet (8 mg total) by mouth every 6 (six) hours as needed for nausea., Disp: 20 tablet, Rfl: 0 oxyCODONE (OXY IR/ROXICODONE) 5 MG immediate release tablet, Take 1-2 tablets (5-10 mg total) by mouth every 4 (four) hours as needed for moderate pain., Disp: 30 tablet, Rfl: 0 estradiol (ESTRACE) 2 MG tablet, Take 1 tablet (2 mg total) by mouth daily., Disp: 30 tablet, Rfl: 11  No current facility-administered medications for this visit.    Blood pressure 111/79, pulse 93, height 5\' 8"  (1.727 m), weight 232 lb 8 oz (105.5 kg), last menstrual period 05/12/2016.  Physical Exam: Incision clean dry intact Healing well  Diagnostic  Tests:   Pathology: Endometriotic changes  Impression: S/p BSO  Plan:   Follow up: prn    Florian Buff, MD

## 2020-01-02 ENCOUNTER — Other Ambulatory Visit: Payer: Self-pay | Admitting: Family Medicine

## 2020-01-19 ENCOUNTER — Telehealth: Payer: Self-pay

## 2020-01-19 ENCOUNTER — Telehealth: Payer: Self-pay | Admitting: Obstetrics & Gynecology

## 2020-01-19 MED ORDER — ESTRADIOL 2 MG PO TABS: ORAL_TABLET | ORAL | 11 refills | Status: DC

## 2020-01-19 NOTE — Telephone Encounter (Signed)
Pt aware to double up on Estradiol, so take 2 tabs daily, around 12 hours apart. Pt voiced understanding. [place]

## 2020-01-19 NOTE — Telephone Encounter (Signed)
Pt states for the last few weeks, she has felt "off". Feels blah. Pt takes Xanax. Pt feels depressed. Pt don't feel like hurting herself or anyone else. Can you prescribe med? Thanks!! [place]

## 2020-01-19 NOTE — Telephone Encounter (Signed)
Pt stated that she has began feeling down and depressed, mood swings since her procedure with and the medication that she started taking after the procedure. Wanting to know if there needs to be a medication change

## 2020-01-19 NOTE — Telephone Encounter (Signed)
Take 1 tablet twice a day

## 2020-02-07 ENCOUNTER — Other Ambulatory Visit: Payer: Self-pay | Admitting: Family Medicine

## 2020-02-08 ENCOUNTER — Other Ambulatory Visit: Payer: Self-pay

## 2020-02-08 ENCOUNTER — Ambulatory Visit (INDEPENDENT_AMBULATORY_CARE_PROVIDER_SITE_OTHER): Payer: Medicare Other | Admitting: Family Medicine

## 2020-02-08 ENCOUNTER — Encounter: Payer: Self-pay | Admitting: Family Medicine

## 2020-02-08 VITALS — BP 135/80 | HR 73 | Temp 97.6°F | Resp 20 | Ht 68.0 in | Wt 234.0 lb

## 2020-02-08 DIAGNOSIS — F419 Anxiety disorder, unspecified: Secondary | ICD-10-CM

## 2020-02-08 DIAGNOSIS — M4722 Other spondylosis with radiculopathy, cervical region: Secondary | ICD-10-CM

## 2020-02-08 DIAGNOSIS — M4712 Other spondylosis with myelopathy, cervical region: Secondary | ICD-10-CM

## 2020-02-08 DIAGNOSIS — K219 Gastro-esophageal reflux disease without esophagitis: Secondary | ICD-10-CM

## 2020-02-08 DIAGNOSIS — S069X5D Unspecified intracranial injury with loss of consciousness greater than 24 hours with return to pre-existing conscious level, subsequent encounter: Secondary | ICD-10-CM | POA: Diagnosis not present

## 2020-02-08 MED ORDER — FEXOFENADINE-PSEUDOEPHED ER 180-240 MG PO TB24: 1.0000 | ORAL_TABLET | Freq: Every evening | ORAL | 11 refills | Status: DC

## 2020-02-08 MED ORDER — ALPRAZOLAM 0.5 MG PO TABS
0.7500 mg | ORAL_TABLET | Freq: Every day | ORAL | 5 refills | Status: DC
Start: 2020-02-08 — End: 2020-08-15

## 2020-02-08 MED ORDER — BREO ELLIPTA 100-25 MCG/INH IN AEPB: 1.0000 | INHALATION_SPRAY | Freq: Every day | RESPIRATORY_TRACT | 11 refills | Status: DC

## 2020-02-08 MED ORDER — DULOXETINE HCL 30 MG PO CPEP
30.0000 mg | ORAL_CAPSULE | Freq: Every day | ORAL | 1 refills | Status: DC
Start: 2020-02-08 — End: 2020-07-18

## 2020-02-08 MED ORDER — CELECOXIB 200 MG PO CAPS: 200.0000 mg | ORAL_CAPSULE | Freq: Every day | ORAL | 5 refills | Status: DC

## 2020-02-08 NOTE — Progress Notes (Signed)
Subjective:  Patient ID: Shelby [name], female    DOB: [birth year]  Age: 44 y.o. MRN: [id number]  CC: Medical Management of Chronic Issues HPI: Increasing shortness of breath.  She is using her albuterol daily without any relief.  Follow-up reflux using albuterol daily without any relief from increasing shortness of breath.  Patient in for follow-up of GERD. Currently asymptomatic taking  PPI daily. There is no chest pain or heartburn. No hematemesis and no melena. No dysphagia or choking. Onset is remote. Progression is stable. Complicating factors, none.     Depression screen [facility] 2/9 02/08/2020 11/16/2019 10/13/2019  Decreased Interest 0 2 3  Down, Depressed, Hopeless 0 0 3  PHQ - 2 Score 0 2 6  Altered sleeping - 3 1  Tired, decreased energy - 3 3  Change in appetite - 3 3  Feeling bad or failure about yourself  - 1 3  Trouble concentrating - 0 3  Moving slowly or fidgety/restless - 0 0  Suicidal thoughts - 0 0  PHQ-9 Score - 12 19  Difficult doing work/chores - - -  Some recent data might be hidden    History Bryndle has a past medical history of Anemia, Anxiety, Arthritis, Asthma, Bad memory, Bipolar disorder (HCC), Chronic kidney disease, Depression, Endometriosis, Migraines, Pneumonia, Sleep apnea, Transient alteration of awareness (09/24/2015), and Traumatic brain injury (HCC) (2001).   She has a past surgical history that includes Cesarean section; cyst remove; uterine ablation; Tubal ligation; Umbilical hernia repair (09/20/2011); Incisional hernia repair (12/27/2011); Wound debridement (12/27/2011); Insertion of mesh (12/27/2011); Hand surgery; Abdominal hysterectomy (06/2017); Colonoscopy with propofol (N/A, 09/22/2017); Esophagogastroduodenoscopy (egd) with propofol (N/A, 03/12/2018); maloney dilation (N/A, 03/12/2018); Spinal cord stimulator insertion (N/A, 07/14/2019); Laparoscopic bilateral salpingo oophorectomy (Bilateral, 12/08/2019); and Salpingoophorectomy (Bilateral,  12/08/2019).   Her family history includes Alzheimer's disease in her maternal grandmother and paternal grandmother; Colon cancer in her paternal grandfather; Diabetes in her father; Healthy in her daughter and daughter.She reports that she has been smoking cigarettes. She has a 12.50 pack-year smoking history. She has never used smokeless tobacco. She reports that she does not drink alcohol and does not use drugs.    ROS Review of Systems  Constitutional: Negative.   HENT: Negative.   Eyes: Negative for visual disturbance.  Respiratory: Positive for shortness of breath.   Cardiovascular: Negative for chest pain.  Gastrointestinal: Negative for abdominal pain.  Musculoskeletal: Negative for arthralgias.    Objective:  BP 135/80   Pulse 73   Temp 97.6 F (36.4 C) (Temporal)   Resp 20   Ht 5\' 8"  (1.727 m)   Wt 234 lb (106.1 kg)   LMP 05/12/2016   SpO2 95%   BMI 35.58 kg/m   BP Readings from Last 3 Encounters:  02/08/20 135/80  12/17/19 111/79  12/09/19 107/62    Wt Readings from Last 3 Encounters:  02/08/20 234 lb (106.1 kg)  12/17/19 232 lb 8 oz (105.5 kg)  12/07/19 254 lb (115.2 kg)     Physical Exam Constitutional:      General: She is not in acute distress.    Appearance: She is well-developed.  HENT:     Head: Normocephalic and atraumatic.  Eyes:     Conjunctiva/sclera: Conjunctivae normal.     Pupils: Pupils are equal, round, and reactive to light.  Neck:     Thyroid: No thyromegaly.  Cardiovascular:     Rate and Rhythm: Normal rate and regular rhythm.     Heart sounds:  Normal heart sounds. No murmur heard.   Pulmonary:     Effort: Pulmonary effort is normal. No respiratory distress.     Breath sounds: Normal breath sounds. No wheezing or rales.  Abdominal:     General: Bowel sounds are normal. There is no distension.     Palpations: Abdomen is soft.     Tenderness: There is no abdominal tenderness.  Musculoskeletal:        General: Normal range  of motion.     Cervical back: Normal range of motion and neck supple.  Lymphadenopathy:     Cervical: No cervical adenopathy.  Skin:    General: Skin is warm and dry.  Neurological:     Mental Status: She is alert and oriented to person, place, and time.  Psychiatric:        Behavior: Behavior normal.        Thought Content: Thought content normal.        Judgment: Judgment normal.       Assessment & Plan:   Viki was seen today for medical management of chronic issues.  Diagnoses and all orders for this visit:  Traumatic brain injury, with loss of consciousness greater than 24 hours with return to pre-existing conscious level, subsequent encounter  Anxiety  Gastroesophageal reflux disease, unspecified whether esophagitis present  Cervical spondylosis with myelopathy and radiculopathy  Other orders -     DULoxetine (CYMBALTA) 30 MG capsule; Take 1 capsule (30 mg total) by mouth daily. -     ALPRAZolam (XANAX) 0.5 MG tablet; Take 1.5 tablets (0.75 mg total) by mouth at bedtime. -     fluticasone furoate-vilanterol (BREO ELLIPTA) 100-25 MCG/INH AEPB; Inhale 1 puff into the lungs daily. -     fexofenadine-pseudoephedrine (ALLEGRA-D 24) 180-240 MG 24 hr tablet; Take 1 tablet by mouth every evening. For allergy and congestion -     celecoxib (CELEBREX) 200 MG capsule; Take 1 capsule (200 mg total) by mouth daily. With food       I have discontinued Takeia M. Aguillard's ibuprofen. I am also having her start on Breo Ellipta, fexofenadine-pseudoephedrine, and celecoxib. Additionally, I am having her maintain her esomeprazole, ARIPiprazole, ondansetron, oxyCODONE, albuterol, estradiol, DULoxetine, and ALPRAZolam.  Allergies as of 02/08/2020      Reactions   Chantix [varenicline] Other (See Comments)   Nightmares   Penicillins Other (See Comments)   Unknown Has patient had a PCN reaction causing immediate rash, facial/tongue/throat swelling, SOB or lightheadedness with  hypotension: Unknown Has patient had a PCN reaction causing severe rash involving mucus membranes or skin necrosis: Unknown Has patient had a PCN reaction that required hospitalization: Unknown Has patient had a PCN reaction occurring within the last 10 years: No If all of the above answers are "NO", then may proceed with Cephalosporin use.   Latex Rash      Medication List       Accurate as of February 08, 2020 11:59 PM. If you have any questions, ask your nurse or doctor.        STOP taking these medications   ibuprofen 200 MG tablet Commonly known as: ADVIL Stopped by: Claretta Fraise, MD     TAKE these medications   albuterol 108 (90 Base) MCG/ACT inhaler Commonly known as: VENTOLIN HFA INHALE 2 PUFFS INTO THE LUNGS EVERY 6 HOURS AS NEEDED FOR WHEEZING OR SHORTNESS OF BREATH   ALPRAZolam 0.5 MG tablet Commonly known as: XANAX Take 1.5 tablets (0.75 mg total) by mouth at bedtime.  ARIPiprazole 20 MG tablet Commonly known as: ABILIFY Take 1 tablet (20 mg total) by mouth at bedtime. (Needs to be seen before next refill)   Breo Ellipta 100-25 MCG/INH Aepb Generic drug: fluticasone furoate-vilanterol Inhale 1 puff into the lungs daily. Started by: Claretta Fraise, MD   celecoxib 200 MG capsule Commonly known as: CeleBREX Take 1 capsule (200 mg total) by mouth daily. With food Started by: Claretta Fraise, MD   DULoxetine 30 MG capsule Commonly known as: CYMBALTA Take 1 capsule (30 mg total) by mouth daily.   esomeprazole 40 MG capsule Commonly known as: NexIUM Take 1 capsule (40 mg total) by mouth 2 (two) times daily before a meal. What changed: when to take this   estradiol 2 MG tablet Commonly known as: Estrace 1 tablet twce a day, approximately 12 hours apart What changed: Another medication with the same name was removed. Continue taking this medication, and follow the directions you see here. Changed by: Claretta Fraise, MD   fexofenadine-pseudoephedrine 180-240  MG 24 hr tablet Commonly known as: ALLEGRA-D 24 Take 1 tablet by mouth every evening. For allergy and congestion Started by: Claretta Fraise, MD   ondansetron 8 MG tablet Commonly known as: ZOFRAN Take 1 tablet (8 mg total) by mouth every 6 (six) hours as needed for nausea.   oxyCODONE 5 MG immediate release tablet Commonly known as: Oxy IR/ROXICODONE Take 1-2 tablets (5-10 mg total) by mouth every 4 (four) hours as needed for moderate pain.        Follow-up: Return in about 6 weeks (around 03/21/2020).  Claretta Fraise, M.D.

## 2020-02-09 ENCOUNTER — Encounter: Payer: Self-pay | Admitting: Family Medicine

## 2020-02-12 HISTORY — PX: OTHER SURGICAL HISTORY: SHX169

## 2020-02-29 ENCOUNTER — Encounter: Payer: Self-pay | Admitting: Family Medicine

## 2020-02-29 ENCOUNTER — Ambulatory Visit (INDEPENDENT_AMBULATORY_CARE_PROVIDER_SITE_OTHER): Payer: Medicare Other | Admitting: Family Medicine

## 2020-02-29 DIAGNOSIS — J4520 Mild intermittent asthma, uncomplicated: Secondary | ICD-10-CM | POA: Diagnosis not present

## 2020-02-29 DIAGNOSIS — K219 Gastro-esophageal reflux disease without esophagitis: Secondary | ICD-10-CM | POA: Diagnosis not present

## 2020-02-29 DIAGNOSIS — F3342 Major depressive disorder, recurrent, in full remission: Secondary | ICD-10-CM

## 2020-02-29 MED ORDER — BREO ELLIPTA 200-25 MCG/INH IN AEPB: 1.0000 | INHALATION_SPRAY | Freq: Every day | RESPIRATORY_TRACT | 11 refills | Status: DC

## 2020-02-29 MED ORDER — ESOMEPRAZOLE MAGNESIUM 40 MG PO CPDR: 40.0000 mg | DELAYED_RELEASE_CAPSULE | Freq: Two times a day (BID) | ORAL | 3 refills | Status: DC

## 2020-02-29 MED ORDER — ALBUTEROL SULFATE HFA 108 (90 BASE) MCG/ACT IN AERS: 2.0000 | INHALATION_SPRAY | RESPIRATORY_TRACT | 3 refills | Status: DC | PRN

## 2020-02-29 NOTE — Progress Notes (Signed)
Subjective:    Patient ID: Shelby [name], female    DOB: [birth year], 45 y.o.   MRN: [id number]   HPI: Shelby [name] is a 45 y.o. female presenting for check up. Meds working well for her. She isn't as depressed as she was. See PHQ below.  Still getting some dyspnea with walking about 1/2 mile. Using breo daily, but having to use resuc inhaler several times a week with exertion. It is effective.    Patient in for follow-up of GERD. Currently asymptomatic taking  PPI daily. There is no chest pain or heartburn. No hematemesis and no melena. No dysphagia or choking. Onset is remote. Progression is stable. Complicating factors, none.   Depression screen [facility] 2/9 02/29/2020 02/08/2020 11/16/2019 10/13/2019 08/09/2019  Decreased Interest 0 0 2 3 0  Down, Depressed, Hopeless 0 0 0 3 1  PHQ - 2 Score 0 0 2 6 1   Altered sleeping - - 3 1 1   Tired, decreased energy - - 3 3 1   Change in appetite - - 3 3 1   Feeling bad or failure about yourself  - - 1 3 1   Trouble concentrating - - 0 3 0  Moving slowly or fidgety/restless - - 0 0 0  Suicidal thoughts - - 0 0 0  PHQ-9 Score - - 12 19 5   Difficult doing work/chores - - - - Somewhat difficult  Some recent data might be hidden     Relevant past medical, surgical, family and social history reviewed and updated as indicated.  Interim medical history since our last visit reviewed. Allergies and medications reviewed and updated.  ROS:  Review of Systems  Constitutional: Negative.   HENT: Negative.   Eyes: Negative for visual disturbance.  Respiratory: Positive for shortness of breath.   Cardiovascular: Negative for chest pain.  Gastrointestinal: Negative for abdominal pain.  Musculoskeletal: Negative for arthralgias.     Social History   Tobacco Use  Smoking Status Current Every Day Smoker  . Packs/day: 0.50  . Years: 25.00  . Pack years: 12.50  . Types: Cigarettes  Smokeless Tobacco Never Used       Objective:     Wt  Readings from Last 3 Encounters:  02/08/20 234 lb (106.1 kg)  12/17/19 232 lb 8 oz (105.5 kg)  12/07/19 254 lb (115.2 kg)     Exam deferred. Pt. Harboring due to COVID 19. Phone visit performed.   Assessment & Plan:   1. Mild intermittent asthma without complication   2. Recurrent major depressive disorder, in full remission ([place])   3. Gastroesophageal reflux disease, unspecified whether esophagitis present     Meds ordered this encounter  Medications  . esomeprazole (NEXIUM) 40 MG capsule    Sig: Take 1 capsule (40 mg total) by mouth 2 (two) times daily before a meal.    Dispense:  180 capsule    Refill:  3  . fluticasone furoate-vilanterol (BREO ELLIPTA) 200-25 MCG/INH AEPB    Sig: Inhale 1 puff into the lungs daily.    Dispense:  1 each    Refill:  11  . albuterol (VENTOLIN HFA) 108 (90 Base) MCG/ACT inhaler    Sig: Inhale 2 puffs into the lungs every 4 (four) hours as needed for wheezing or shortness of breath.    Dispense:  54 g    Refill:  3    No orders of the defined types were placed in this encounter.     Diagnoses and all  orders for this visit:  Mild intermittent asthma without complication  Recurrent major depressive disorder, in full remission ([place])  Gastroesophageal reflux disease, unspecified whether esophagitis present  Other orders -     esomeprazole (NEXIUM) 40 MG capsule; Take 1 capsule (40 mg total) by mouth 2 (two) times daily before a meal. -     fluticasone furoate-vilanterol (BREO ELLIPTA) 200-25 MCG/INH AEPB; Inhale 1 puff into the lungs daily. -     albuterol (VENTOLIN HFA) 108 (90 Base) MCG/ACT inhaler; Inhale 2 puffs into the lungs every 4 (four) hours as needed for wheezing or shortness of breath.    Virtual Visit via telephone Note  I discussed the limitations, risks, security and privacy concerns of performing an evaluation and management service by telephone and the availability of in person appointments. The patient was identified  with two identifiers. Pt.expressed understanding and agreed to proceed. Pt. Is at home. Dr. Livia Snellen is in his office.  Follow Up Instructions:   I discussed the assessment and treatment plan with the patient. The patient was provided an opportunity to ask questions and all were answered. The patient agreed with the plan and demonstrated an understanding of the instructions.   The patient was advised to call back or seek an in-person evaluation if the symptoms worsen or if the condition fails to improve as anticipated.   Total minutes including chart review and phone contact time: 24   Follow up plan: Return in about 6 months (around 08/28/2020), or if symptoms worsen or fail to improve.  Claretta Fraise, MD [place]

## 2020-03-16 ENCOUNTER — Encounter: Payer: Self-pay | Admitting: Internal Medicine

## 2020-04-26 ENCOUNTER — Telehealth: Payer: Self-pay | Admitting: Obstetrics & Gynecology

## 2020-04-26 ENCOUNTER — Other Ambulatory Visit: Payer: Self-pay | Admitting: Family Medicine

## 2020-04-26 NOTE — Telephone Encounter (Signed)
Patient called stating that she would like for Dr. Elonda Husky to change her estradiol to something else, she states this one is not working. Patient states that she uses [facility] on [address]. Patient states that she would like a call back when medication has been called into the Pharmacy.

## 2020-04-28 ENCOUNTER — Telehealth: Payer: Self-pay

## 2020-04-28 NOTE — Telephone Encounter (Signed)
Pt says that esomeprazole ([place]) 40 MG capsule is not working and she wants to go back on what she was taking. She does not remember the name of the medication. Use [facility] in [place] on [address]. Pt aware [facility] is not here today and will address on Monday.

## 2020-05-01 ENCOUNTER — Encounter: Payer: Self-pay | Admitting: *Deleted

## 2020-05-01 ENCOUNTER — Other Ambulatory Visit: Payer: Self-pay | Admitting: Family Medicine

## 2020-05-01 ENCOUNTER — Other Ambulatory Visit: Payer: Self-pay | Admitting: Obstetrics & Gynecology

## 2020-05-01 MED ORDER — PANTOPRAZOLE SODIUM 40 MG PO TBEC: 40.0000 mg | DELAYED_RELEASE_TABLET | Freq: Two times a day (BID) | ORAL | 3 refills | Status: DC

## 2020-05-01 NOTE — Telephone Encounter (Signed)
The patient is on a more than adequate dosing of estradiol, the mediciation is bioequivalent to what her ovaries make and she is on a double dose of the usual dosing.  She can try some over the counter supplements to add to it if she likes.  But do not stop the estrogen  She can try  black cohosh and or evening primrose oil as supplements but not replacement  Dr Elonda Husky

## 2020-05-01 NOTE — Telephone Encounter (Signed)
Please let the patient know that I sent their prescription to their pharmacy. Thanks, WS 

## 2020-05-01 NOTE — Telephone Encounter (Signed)
Patient aware and verbalized understanding. °

## 2020-05-08 ENCOUNTER — Ambulatory Visit: Payer: Medicare Other | Admitting: Gastroenterology

## 2020-05-15 DIAGNOSIS — M7541 Impingement syndrome of right shoulder: Secondary | ICD-10-CM | POA: Diagnosis not present

## 2020-05-25 ENCOUNTER — Other Ambulatory Visit (HOSPITAL_COMMUNITY): Payer: Self-pay | Admitting: Family Medicine

## 2020-05-25 DIAGNOSIS — Z1231 Encounter for screening mammogram for malignant neoplasm of breast: Secondary | ICD-10-CM

## 2020-05-29 ENCOUNTER — Inpatient Hospital Stay (HOSPITAL_COMMUNITY): Admission: RE | Admit: 2020-05-29 | Payer: Medicare Other | Source: Ambulatory Visit

## 2020-06-01 ENCOUNTER — Ambulatory Visit (HOSPITAL_COMMUNITY): Payer: Medicare Other

## 2020-06-08 ENCOUNTER — Encounter (HOSPITAL_COMMUNITY): Payer: Self-pay

## 2020-06-08 ENCOUNTER — Ambulatory Visit (HOSPITAL_COMMUNITY): Payer: Medicare Other

## 2020-06-12 ENCOUNTER — Emergency Department (HOSPITAL_COMMUNITY)
Admission: EM | Admit: 2020-06-12 | Discharge: 2020-06-12 | Disposition: A | Discharge status: Home / Self Care | Payer: Medicare Other | Attending: Emergency Medicine | Admitting: Emergency Medicine

## 2020-06-12 ENCOUNTER — Other Ambulatory Visit: Payer: Self-pay

## 2020-06-12 ENCOUNTER — Emergency Department (HOSPITAL_COMMUNITY): Payer: Medicare Other

## 2020-06-12 ENCOUNTER — Encounter (HOSPITAL_COMMUNITY): Payer: Self-pay | Admitting: *Deleted

## 2020-06-12 DIAGNOSIS — F17210 Nicotine dependence, cigarettes, uncomplicated: Secondary | ICD-10-CM | POA: Insufficient documentation

## 2020-06-12 DIAGNOSIS — R55 Syncope and collapse: Secondary | ICD-10-CM

## 2020-06-12 DIAGNOSIS — J45909 Unspecified asthma, uncomplicated: Secondary | ICD-10-CM | POA: Insufficient documentation

## 2020-06-12 DIAGNOSIS — Z91040 Latex allergy status: Secondary | ICD-10-CM | POA: Diagnosis not present

## 2020-06-12 DIAGNOSIS — Z7952 Long term (current) use of systemic steroids: Secondary | ICD-10-CM | POA: Diagnosis not present

## 2020-06-12 DIAGNOSIS — R519 Headache, unspecified: Secondary | ICD-10-CM | POA: Diagnosis not present

## 2020-06-12 DIAGNOSIS — N189 Chronic kidney disease, unspecified: Secondary | ICD-10-CM | POA: Insufficient documentation

## 2020-06-12 LAB — CBC
HCT: 43.5 % (ref 36.0–46.0)
Hemoglobin: 14.3 g/dL (ref 12.0–15.0)
MCH: 29.7 pg (ref 26.0–34.0)
MCHC: 32.9 g/dL (ref 30.0–36.0)
MCV: 90.4 fL (ref 80.0–100.0)
Platelets: 345 10*3/uL (ref 150–400)
RBC: 4.81 MIL/uL (ref 3.87–5.11)
RDW: 12.8 % (ref 11.5–15.5)
WBC: 11.4 10*3/uL — ABNORMAL HIGH (ref 4.0–10.5)
nRBC: 0 % (ref 0.0–0.2)

## 2020-06-12 LAB — URINALYSIS, ROUTINE W REFLEX MICROSCOPIC
Bilirubin Urine: NEGATIVE
Glucose, UA: NEGATIVE mg/dL
Ketones, ur: NEGATIVE mg/dL
Leukocytes,Ua: NEGATIVE
Nitrite: NEGATIVE
Protein, ur: NEGATIVE mg/dL
Specific Gravity, Urine: 1.006 (ref 1.005–1.030)
pH: 7 (ref 5.0–8.0)

## 2020-06-12 LAB — BASIC METABOLIC PANEL
Anion gap: 6 (ref 5–15)
BUN: 7 mg/dL (ref 6–20)
CO2: 30 mmol/L (ref 22–32)
Calcium: 8.9 mg/dL (ref 8.9–10.3)
Chloride: 104 mmol/L (ref 98–111)
Creatinine, Ser: 0.69 mg/dL (ref 0.44–1.00)
GFR, Estimated: >60 mL/min (ref >60)
Glucose, Bld: 93 mg/dL (ref 70–99)
Potassium: 3.7 mmol/L (ref 3.5–5.1)
Sodium: 140 mmol/L (ref 135–145)

## 2020-06-12 LAB — D-DIMER, QUANTITATIVE: D-Dimer, Quant: <0.27 ug/mL-FEU (ref 0.00–0.50)

## 2020-06-12 MED ORDER — ACETAMINOPHEN 325 MG PO TABS
650.0000 mg | ORAL_TABLET | Freq: Once | ORAL | Status: AC
  Administered 2020-06-12: 650 mg via ORAL
  Filled 2020-06-12: qty 2

## 2020-06-12 MED ORDER — IOHEXOL 350 MG/ML SOLN
75.0000 mL | Freq: Once | INTRAVENOUS | Status: AC | PRN
  Administered 2020-06-12: 75 mL via INTRAVENOUS
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
## 2020-06-12 NOTE — ED Notes (Signed)
Patient transported to X-ray 

## 2020-06-12 NOTE — Discharge Instructions (Addendum)
  You were evaluated in the Emergency Department and after careful evaluation, we did not find any emergent condition requiring admission or further testing in the hospital.   Your exam/testing today was overall reassuring.  Symptoms seem to be due to  vasovagal syncope. Please return to the Emergency Department if you experience any worsening of your condition.  Thank you for allowing [place] to be a part of your care. Please speak to your pharmacist about any new medications prescribed today in regards to side effects or interactions with other medications.  Please avoid driving, swimming, taking long showers, taking any narcotics or medications that make you dizzy/sleepy until cleared by your PCP.  You did have some small blood in your urine, which have this repeated with your PCP as well.  Get help right away if: You have a very bad headache. You pass out once or more than once. You have pain in your chest, belly, or back. You have a very fast or uneven heartbeat (palpitations). It hurts to breathe. You are bleeding from your mouth or your bottom (rectum). You have black or tarry poop (stool). You have jerky movements that you cannot control (seizure). You are confused. You have trouble walking. You are very weak. You have vision problems.

## 2020-06-12 NOTE — ED Triage Notes (Signed)
Syncopal episode

## 2020-06-12 NOTE — ED Provider Notes (Signed)
MSE was initiated and I personally evaluated the patient and placed orders (if any) at  5:05 PM on Jun 12, 2020.  The patient appears stable so that the remainder of the MSE may be completed by another provider.   Chief Complaint:  Syncope   HPI:   Patient states that she felt fine this morning, drove to go do some errands and parked in the parking lot.  Patient states that she was sitting and she felt super dizzy and passed out briefly.  States that when she woke up she had a slight headache, otherwise no other symptoms.  Currently has a slight headache, no nausea vomiting, head trauma.  No history of seizures or any substance use.  States that she was feeling fine before this.  No fevers.   ROS:  Headache, syncope   Physical Exam:  Gen:                Awake, no distress  HEENT:          Atraumatic  Resp:              Normal effort  Cardiac:          Normal rate  Abd:                Nondistended, nontender  MSK:              Moves extremities without difficulty  Neuro:            Speech clear,EOMS intact, normal strength to bilateral upper and lower extremities.  Negative pronator drift.     Initiation of care has begun. The patient has been counseled on the process, plan, and necessity for staying for the completion/evaluation, and the remainder of the medical screening examination    Alfredia Client, PA-C 06/12/20 1738    Long, [facility], MD 06/13/20 1045

## 2020-06-14 ENCOUNTER — Ambulatory Visit: Payer: Medicare Other | Admitting: Gastroenterology

## 2020-06-16 ENCOUNTER — Other Ambulatory Visit: Payer: Self-pay

## 2020-06-16 ENCOUNTER — Encounter: Payer: Self-pay | Admitting: Nurse Practitioner

## 2020-06-16 ENCOUNTER — Ambulatory Visit (INDEPENDENT_AMBULATORY_CARE_PROVIDER_SITE_OTHER): Payer: Medicare Other | Admitting: Nurse Practitioner

## 2020-06-16 VITALS — BP 130/87 | HR 67 | Temp 97.9°F | Ht 68.0 in | Wt 241.0 lb

## 2020-06-16 DIAGNOSIS — G4489 Other headache syndrome: Secondary | ICD-10-CM | POA: Diagnosis not present

## 2020-06-16 MED ORDER — EXCEDRIN MIGRAINE 250-250-65 MG PO TABS: 1.0000 | ORAL_TABLET | Freq: Every day | ORAL | 0 refills | Status: DC | PRN

## 2020-06-16 NOTE — Patient Instructions (Signed)
General Headache Without Cause A headache is pain or discomfort felt around the head or neck area. The specific cause of a headache may not be found. There are many causes and types of headaches. A few common ones are:  Tension headaches.  Migraine headaches.  Cluster headaches.  Chronic daily headaches. Follow these instructions at home: Watch your condition for any changes. Let your health care provider know about them. Take these steps to help with your condition: Managing pain  Take over-the-counter and prescription medicines only as told by your health care provider.  Lie down in a dark, quiet room when you have a headache.  If directed, put ice on your head and neck area: ? Put ice in a plastic bag. ? Place a towel between your skin and the bag. ? Leave the ice on for 20 minutes, 2-3 times per day.  If directed, apply heat to the affected area. Use the heat source that your health care provider recommends, such as a moist heat pack or a heating pad. ? Place a towel between your skin and the heat source. ? Leave the heat on for 20-30 minutes. ? Remove the heat if your skin turns bright red. This is especially important if you are unable to feel pain, heat, or cold. You may have a greater risk of getting burned.  Keep lights dim if bright lights bother you or make your headaches worse.      Eating and drinking  Eat meals on a regular schedule.  If you drink alcohol: ? Limit how much you use to:  0-1 drink a day for women.  0-2 drinks a day for men. ? Be aware of how much alcohol is in your drink. In the [place] one drink equals one 12 oz bottle of beer (355 mL), one 5 oz glass of wine (148 mL), or one 1 oz glass of hard liquor (44 mL).  Stop drinking caffeine, or decrease the amount of caffeine you drink. General instructions  Keep a headache journal to help find out what may trigger your headaches. For example, write down: ? What you eat and drink. ? How much sleep  you get. ? Any change to your diet or medicines.  Try massage or other relaxation techniques.  Limit stress.  Sit up straight, and do not tense your muscles.  Do not use any products that contain nicotine or tobacco, such as cigarettes, e-cigarettes, and chewing tobacco. If you need help quitting, ask your health care provider.  Exercise regularly as told by your health care provider.  Sleep on a regular schedule. Get 7-9 hours of sleep each night, or the amount recommended by your health care provider.  Keep all follow-up visits as told by your health care provider. This is important.   Contact a health care provider if:  Your symptoms are not helped by medicine.  You have a headache that is different from the usual headache.  You have nausea or you vomit.  You have a fever. Get help right away if:  Your headache becomes severe quickly.  Your headache gets worse after moderate to intense physical activity.  You have repeated vomiting.  You have a stiff neck.  You have a loss of vision.  You have problems with speech.  You have pain in the eye or ear.  You have muscular weakness or loss of muscle control.  You lose your balance or have trouble walking.  You feel faint or pass out.  You have   confusion.  You have a seizure. Summary  A headache is pain or discomfort felt around the head or neck area.  There are many causes and types of headaches. In some cases, the cause may not be found.  Keep a headache journal to help find out what may trigger your headaches. Watch your condition for any changes. Let your health care provider know about them.  Contact a health care provider if you have a headache that is different from the usual headache, or if your symptoms are not helped by medicine.  Get help right away if your headache becomes severe, you vomit, you have a loss of vision, you lose your balance, or you have a seizure. This information is not intended to  replace advice given to you by your health care provider. Make sure you discuss any questions you have with your health care provider. Document Revised: 08/18/2017 Document Reviewed: 08/18/2017 Elsevier Patient Education  2021 Elsevier Inc.  
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
## 2020-06-21 ENCOUNTER — Other Ambulatory Visit: Payer: Self-pay | Admitting: Nurse Practitioner

## 2020-06-21 DIAGNOSIS — G4489 Other headache syndrome: Secondary | ICD-10-CM

## 2020-06-22 ENCOUNTER — Ambulatory Visit (HOSPITAL_COMMUNITY)
Admission: RE | Admit: 2020-06-22 | Discharge: 2020-06-22 | Disposition: A | Discharge status: Home / Self Care | Payer: Medicare Other | Source: Ambulatory Visit | Attending: Family Medicine | Admitting: Family Medicine

## 2020-06-22 DIAGNOSIS — Z1231 Encounter for screening mammogram for malignant neoplasm of breast: Secondary | ICD-10-CM | POA: Diagnosis present

## 2020-07-18 ENCOUNTER — Encounter: Payer: Self-pay | Admitting: Family Medicine

## 2020-07-18 ENCOUNTER — Other Ambulatory Visit: Payer: Self-pay

## 2020-07-18 ENCOUNTER — Ambulatory Visit (INDEPENDENT_AMBULATORY_CARE_PROVIDER_SITE_OTHER): Payer: Medicare Other | Admitting: Family Medicine

## 2020-07-18 VITALS — BP 133/88 | HR 64 | Ht 68.0 in | Wt 238.4 lb

## 2020-07-18 DIAGNOSIS — F3342 Major depressive disorder, recurrent, in full remission: Secondary | ICD-10-CM

## 2020-07-18 DIAGNOSIS — R739 Hyperglycemia, unspecified: Secondary | ICD-10-CM | POA: Diagnosis not present

## 2020-07-18 DIAGNOSIS — Z7689 Persons encountering health services in other specified circumstances: Secondary | ICD-10-CM

## 2020-07-18 DIAGNOSIS — R42 Dizziness and giddiness: Secondary | ICD-10-CM | POA: Diagnosis not present

## 2020-07-18 DIAGNOSIS — Z13220 Encounter for screening for lipoid disorders: Secondary | ICD-10-CM

## 2020-07-18 DIAGNOSIS — S069X5D Unspecified intracranial injury with loss of consciousness greater than 24 hours with return to pre-existing conscious level, subsequent encounter: Secondary | ICD-10-CM

## 2020-07-18 DIAGNOSIS — E8941 Symptomatic postprocedural ovarian failure: Secondary | ICD-10-CM | POA: Diagnosis not present

## 2020-07-18 DIAGNOSIS — F17200 Nicotine dependence, unspecified, uncomplicated: Secondary | ICD-10-CM | POA: Diagnosis not present

## 2020-07-18 MED ORDER — DULOXETINE HCL 60 MG PO CPEP: 60.0000 mg | ORAL_CAPSULE | Freq: Every day | ORAL | 2 refills | Status: DC

## 2020-07-18 NOTE — Patient Instructions (Addendum)
It was nice seeing you today!  Look at Psychologytoday.com to see which providers are in [place].  Increase your duloxetine to 60 mg daily. This can help with depression and hot flashes.  Let's hold off on starting a new medication (buproprion/Wellbutrin) for smoking.   I will update you with results of your blood work.  Try to cut back on soda and drink more water.  Follow-up in 1 month.  Please arrive at least 15 minutes prior to your scheduled appointments.  Stay well, Zola Button, MD [place] [phone]      Therapy and Counseling Resources Most providers on this list will take Medicaid. Patients with commercial insurance or Medicare should contact their insurance company to get a list of in network providers.  [facility]:Psychiatry and Counseling [address] [facility] [address] [place], [place] [address] [place], [place], [place] [address]      [place] [address]  [place], [place] [address] [phone]  [place] [address], [place]  [place], [place] [address]       [phone]     [facility] (virtual only) [phone]  Jinny Blossom [facility] [address] [place], [place]  Family Solutions:  [place]. [place] [phone]  [facility]:  [place] [address] [phone]  [facility] (under & uninsured) [address], [place] [phone]    [email or web address]    [place] [address] . [place]    [phone]  [facility] [place]     Phone:  [phone]     [place]  [place] [address]      [place], [place]:  [place], [place], [place]   [place] ([place] therapist) [email or web address]  [place] [address]   [place] [address]    [phone]    [facility]   [address],  [place], [place]   [place]  [place]  [facility]  [address] [place], [place]        [phone]  Open Access/Walk In Clinic under & uninsured  [facility]  [address] [place], [place] Crisis [phone]  Family Service of the [place],  ([place])   [place], [place]: [phone]) 8:30 - 12; 1 - 2:30  Family Service of the [place],  [place], [place]    [phone]):8:30 - 12; 2 - 3PM  [facility] [employer],  [address],  [place]; [phone]):   Mon - Fri 8 AM - 5 PM  [facility] [place]  MWF 12:30 to 3:00 or call to schedule an appointment  [phone]  Specific Provider options Psychology Today  https://www.psychologytoday.com/us 1. click on find a therapist  2. enter your zip code 3. left side and select or tailor a therapist for your specific need.   [facility] Provider Directory [email or web address]  (Medicaid)   Follow all drop down to find a provider  Social Support program  [place]) [phone] or [email or web address]/ [address], [place], [place] Recovery support and educational   24- Hour Availability:  .  Marland Kitchen [facility]  . [place], [place] Crisis [phone]  . Family Service of the [employer] [phone]  [facility] Crisis Service  [phone]   . [place]  [phone] (after hours)  . Therapeutic Alternative/Mobile Crisis    [phone]  . [place] National Suicide Hotline  302-857-9585 ([place])  . Call 911 or go to emergency room  . [employer]  [phone]);  [place] and [employer]   . [facility] ACCESS  [phone]); [place], [place], [place], [place], [place], [place], [place]

## 2020-07-18 NOTE — Assessment & Plan Note (Signed)
Secondary to hysterectomy.  Currently on estradiol but is still having symptoms.  We will increase SNRI for potential benefit. - increase duloxetine to 60 mg

## 2020-07-18 NOTE — Assessment & Plan Note (Signed)
Currently on aripiprazole, duloxetine, and alprazolam.  We will plan to wean off alprazolam in the future, patient amenable.  Therapy resources provided, patient interested. - increase duloxetine to 60 mg daily - recommended therapy

## 2020-07-18 NOTE — Assessment & Plan Note (Signed)
Current 1.5 pack/day smoker.  Did not tolerate varenicline or nicotine patch previously.  Interested and assistance with smoking cessation.  Could consider bupropion but will defer to follow-up visit.
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
## 2020-07-19 LAB — CBC
Hematocrit: 41.8 % (ref 34.0–46.6)
Hemoglobin: 13.8 g/dL (ref 11.1–15.9)
MCH: 28.6 pg (ref 26.6–33.0)
MCHC: 33 g/dL (ref 31.5–35.7)
MCV: 87 fL (ref 79–97)
Platelets: 351 10*3/uL (ref 150–450)
RBC: 4.83 x10E6/uL (ref 3.77–5.28)
RDW: 12 % (ref 11.7–15.4)
WBC: 11.4 10*3/uL — ABNORMAL HIGH (ref 3.4–10.8)

## 2020-07-19 LAB — BASIC METABOLIC PANEL
BUN/Creatinine Ratio: 8 — ABNORMAL LOW (ref 9–23)
BUN: 7 mg/dL (ref 6–24)
CO2: 25 mmol/L (ref 20–29)
Calcium: 9.1 mg/dL (ref 8.7–10.2)
Chloride: 101 mmol/L (ref 96–106)
Creatinine, Ser: 0.86 mg/dL (ref 0.57–1.00)
Glucose: 81 mg/dL (ref 65–99)
Potassium: 3.8 mmol/L (ref 3.5–5.2)
Sodium: 140 mmol/L (ref 134–144)
eGFR: 85 mL/min/{1.73_m2} (ref >59)

## 2020-07-19 LAB — LIPID PANEL
Chol/HDL Ratio: 6 ratio — ABNORMAL HIGH (ref 0.0–4.4)
Cholesterol, Total: 197 mg/dL (ref 100–199)
HDL: 33 mg/dL — ABNORMAL LOW (ref >39)
LDL Chol Calc (NIH): 126 mg/dL — ABNORMAL HIGH (ref 0–99)
Triglycerides: 211 mg/dL — ABNORMAL HIGH (ref 0–149)
VLDL Cholesterol Cal: 38 mg/dL (ref 5–40)

## 2020-07-19 LAB — HEMOGLOBIN A1C
Est. average glucose Bld gHb Est-mCnc: 108 mg/dL
Hgb A1c MFr Bld: 5.4 % (ref 4.8–5.6)

## 2020-08-07 ENCOUNTER — Other Ambulatory Visit: Payer: Self-pay | Admitting: Family Medicine
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
## 2020-08-10 NOTE — Patient Instructions (Addendum)
It was nice seeing you today!  Look at [email or web address] to see which providers are in [place].  Take Xanax 0.5 mg at night, we will plan to taper further in 1 month.  Try buspirone for anxiety, take twice a day.  I am sending you to vestibular rehab which may help with your vertigo.  The pharmacy team will reach out to you about scheduling a visit to help with your smoking.  Follow-up in 1 month.  Please arrive at least 15 minutes prior to your scheduled appointments.  Stay well, Zola Button, MD [place] [phone]

## 2020-08-15 ENCOUNTER — Ambulatory Visit (INDEPENDENT_AMBULATORY_CARE_PROVIDER_SITE_OTHER): Payer: Medicare Other | Admitting: Family Medicine

## 2020-08-15 ENCOUNTER — Other Ambulatory Visit: Payer: Self-pay

## 2020-08-15 DIAGNOSIS — R42 Dizziness and giddiness: Secondary | ICD-10-CM

## 2020-08-15 DIAGNOSIS — F3342 Major depressive disorder, recurrent, in full remission: Secondary | ICD-10-CM | POA: Diagnosis not present

## 2020-08-15 DIAGNOSIS — F17200 Nicotine dependence, unspecified, uncomplicated: Secondary | ICD-10-CM

## 2020-08-15 DIAGNOSIS — F419 Anxiety disorder, unspecified: Secondary | ICD-10-CM | POA: Diagnosis not present

## 2020-08-15 MED ORDER — BUSPIRONE HCL 5 MG PO TABS: 7.5000 mg | ORAL_TABLET | Freq: Two times a day (BID) | ORAL | 2 refills | Status: DC

## 2020-08-15 MED ORDER — ALPRAZOLAM 0.5 MG PO TABS: 0.5000 mg | ORAL_TABLET | Freq: Every day | ORAL | 0 refills | Status: DC

## 2020-08-15 NOTE — Assessment & Plan Note (Signed)
Therapy resources provided again today.  Continue current medications and weaning alprazolam per below.

## 2020-08-15 NOTE — Assessment & Plan Note (Signed)
Dizziness appears vertiginous in nature.  Normal TTE in 2019 and most recent EKG was unremarkable other than occasional PVC.  Possible BPPV given positional component.  Could also consider Mnire's disease given tinnitus. - vestibular rehab
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
## 2020-08-15 NOTE — Assessment & Plan Note (Signed)
We will initiate weaning of alprazolam.  Having some mild withdrawal symptoms but no seizures. - alprazolam 0.5 mg qhs for 1 month, then plan to reduce to 0.25 mg qhs if tolerating well - start buspirone

## 2020-08-18 ENCOUNTER — Telehealth: Payer: Self-pay | Admitting: Pharmacist

## 2020-08-18 NOTE — Telephone Encounter (Signed)
Contacted patient to schedule a time for tobacco cessation visit.   Agreed to 7/11 at 9:00 AM

## 2020-08-18 NOTE — Telephone Encounter (Signed)
-----   Message from [place], [facility] sent at 08/17/2020  3:56 PM EDT ----- I am sure you saw this but it is for smoking cessation.  ----- Message ----- From: Zola Button, MD Sent: 08/15/2020   4:08 PM EDT To: [facility]

## 2020-08-20 ENCOUNTER — Other Ambulatory Visit: Payer: Self-pay | Admitting: Family Medicine

## 2020-08-21 ENCOUNTER — Ambulatory Visit (INDEPENDENT_AMBULATORY_CARE_PROVIDER_SITE_OTHER): Payer: Medicare Other | Admitting: Pharmacist

## 2020-08-21 ENCOUNTER — Other Ambulatory Visit: Payer: Self-pay

## 2020-08-21 ENCOUNTER — Encounter: Payer: Self-pay | Admitting: Pharmacist

## 2020-08-21 DIAGNOSIS — F17200 Nicotine dependence, unspecified, uncomplicated: Secondary | ICD-10-CM

## 2020-08-21 MED ORDER — VARENICLINE TARTRATE 0.5 MG PO TABS: 0.5000 mg | ORAL_TABLET | Freq: Two times a day (BID) | ORAL | 3 refills | Status: DC

## 2020-08-21 NOTE — Assessment & Plan Note (Signed)
Tobacco use disorder with moderate nicotine dependence of many years duration in a patient who is fair candidate for success because of interest in quitting.  Following some discuss about varenicline adverse effect we agreed that varenicline may be useful and tolerated better at low doses.  -Initiated varenicline 0.5 mg by mouth once daily with food. Patient counseled on purpose, proper use, and potential adverse effects, including GI upset.. We will start with one daily with food.   - initial goal is ~ 50% reduction in intake while using low dose varenicline. -Provided information on 1 800-QUIT NOW support program.
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
## 2020-08-21 NOTE — Progress Notes (Signed)
Reviewed: I agree with Dr. Koval's documentation and management. 

## 2020-08-21 NOTE — Patient Instructions (Signed)
Great to meet you today!  Start Chantix 0.5mg  once daily with food.   Telephone contact planned in 2 weeks.

## 2020-09-05 ENCOUNTER — Ambulatory Visit: Payer: Medicare Other | Admitting: Gastroenterology

## 2020-09-05 ENCOUNTER — Encounter: Payer: Self-pay | Admitting: Internal Medicine

## 2020-09-05 ENCOUNTER — Telehealth: Payer: Self-pay | Admitting: Pharmacist

## 2020-09-05 MED ORDER — NORTRIPTYLINE HCL 25 MG PO CAPS: ORAL_CAPSULE | ORAL | 0 refills | Status: DC

## 2020-09-05 NOTE — Telephone Encounter (Signed)
Patient contacted for follow/up of tobacco intake reduction / tobacco cessation attempt.   Since last contact patient reports trying varenicline again.  At lowest dose she reports worsened insomnia and she stopped the varenicline.    Medications currently being used; None.   She reports trying Nicotine Replacement including: patches (itch), gum (no teeth) and lozenges (vomiting)  without success due to the listed side effects.   She continues to smoke about 1.5 ppd  She also reports that she smokes during the night almost every night.    I expressed my concern that she is not sleeping well at night.  She was agreeable that something to help her reduce intake while promoting sleep was something she would be willing to try.  - Initiated a trial of nortriptyline '25mg'$  QHS for 1 week followed by an increase to 2 tablets ('50mg'$ ) QHS at week two with goal of improved sleep while reducing nocturnal tobacco intake.  Patient educated on purpose, proper use and potential adverse effects of sedation.  Following instruction patient verbalized understanding of treatment plan.    Total time with patient call and documentation of interaction: 17 minutes.  F/U at next PCP visit on 8/15 at 4:00 PM with Dr. Nancy Fetter planned.

## 2020-09-05 NOTE — Telephone Encounter (Signed)
-----   Message from Leavy Cella, [place] sent at 08/21/2020 11:33 AM EDT ----- Regarding: Tobacco intake reduction with use of varenicline

## 2020-09-06 NOTE — Telephone Encounter (Signed)
Noted and agree. 

## 2020-09-11 NOTE — Telephone Encounter (Signed)
Sounds wonderful, thank you for your assistance with this patient.

## 2020-09-25 ENCOUNTER — Encounter: Payer: Self-pay | Admitting: Family Medicine

## 2020-09-25 ENCOUNTER — Other Ambulatory Visit: Payer: Self-pay

## 2020-09-25 ENCOUNTER — Ambulatory Visit (INDEPENDENT_AMBULATORY_CARE_PROVIDER_SITE_OTHER): Payer: Medicare Other | Admitting: Family Medicine

## 2020-09-25 VITALS — Ht 68.0 in

## 2020-09-25 DIAGNOSIS — F17200 Nicotine dependence, unspecified, uncomplicated: Secondary | ICD-10-CM | POA: Diagnosis not present

## 2020-09-25 DIAGNOSIS — F339 Major depressive disorder, recurrent, unspecified: Secondary | ICD-10-CM

## 2020-09-25 DIAGNOSIS — R42 Dizziness and giddiness: Secondary | ICD-10-CM

## 2020-09-25 DIAGNOSIS — F419 Anxiety disorder, unspecified: Secondary | ICD-10-CM

## 2020-09-25 DIAGNOSIS — G4700 Insomnia, unspecified: Secondary | ICD-10-CM | POA: Diagnosis not present

## 2020-09-25 MED ORDER — PANTOPRAZOLE SODIUM 40 MG PO TBEC: 40.0000 mg | DELAYED_RELEASE_TABLET | Freq: Two times a day (BID) | ORAL | 3 refills | Status: DC

## 2020-09-25 MED ORDER — NORTRIPTYLINE HCL 75 MG PO CAPS: 75.0000 mg | ORAL_CAPSULE | Freq: Every day | ORAL | 0 refills | Status: DC

## 2020-09-25 MED ORDER — DULOXETINE HCL 60 MG PO CPEP: 60.0000 mg | ORAL_CAPSULE | Freq: Every day | ORAL | 1 refills | Status: DC

## 2020-09-25 MED ORDER — ALPRAZOLAM 0.5 MG PO TABS: 0.5000 mg | ORAL_TABLET | Freq: Every day | ORAL | 0 refills | Status: DC
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
## 2020-09-25 NOTE — Assessment & Plan Note (Signed)
Will consider retrying varenicline once insomnia is improved.  Smoking about the same, 1 to 1-1/2 packs/day currently.

## 2020-09-25 NOTE — Assessment & Plan Note (Addendum)
Likely a component of anxiety.  Withdrawal from SNRI or benzo may also be contributing.  Still no significant benefit with nortriptyline 50 mg. - increase nortriptyline to 75 mg nightly, Dr. Valentina Lucks to make follow-up call in 1 week - discussed sleep hygiene, recommended to avoid screen time 1 hour before bedtime

## 2020-09-25 NOTE — Assessment & Plan Note (Addendum)
Possible BPPV given positional component or possibly related to history of TBI.  Also on multiple medications that may be contributing to dizziness.  Referral placed for neuro rehab for vestibular rehab, turns out I placed the wrong order last time.  Contact information given for [facility].  We will see if this helps with her dizziness.

## 2020-09-25 NOTE — Patient Instructions (Addendum)
It was nice seeing you today!  I have refilled your medications.  Please call the pharmacy for further refills and if there are any issues, please call our office.  Increase your nortriptyline to 75 mg at bedtime.  Dr. Valentina Lucks will give you a follow-up call in 1 week.  I have placed referral for neuro rehab. Call them if they have not reached out to you for an appointment.  Follow-up in 1 month.  Please arrive at least 15 minutes prior to your scheduled appointments.  Stay well, Shelby Button, MD [place] [phone]    Therapy and Counseling Resources Most providers on this list will take Medicaid. Patients with commercial insurance or Medicare should contact their insurance company to get a list of in network providers.  [facility]:Psychiatry and Counseling [address] [facility]. [place], [place] [address] [place], [place], [place] [address]      [place] [address]  [place], [place] [address] [phone]  [place] [address] [place]  [place], [place] [address]       [phone]     [facility] (virtual only) [phone]  [facility] [address] [place], [place]  Family Solutions:  [place]. [place] [phone]  [facility]:  [place] [address] [place] [phone]  [facility] (under & uninsured) [address] [place] [phone]    [email or web address]    [place] [address]  [place]    [phone]  [facility] [place]     Phone:  [phone]     [place]  [place] [address]      [place], [place] ext [place]: [place], [place], [place]   [place] ([place] therapist) [email or web address]  [place] [address]   [place] [address]    [phone]    [facility]   [address],  [place], [place]   [place]  Impact  [facility]  [address] [place], [place]        [phone]  Open Access/Walk In Clinic under & uninsured  [facility]  [address] [place], [place] Crisis [phone]  [facility] of the [place],  ([place])   [place], [place]: [phone]) 8:30 - 12; 1 - 2:30  Family Service of the [place],  [place], [place]    [phone]):8:30 - 12; 2 - 3PM  [facility] [employer],  [address],  [place]; [phone]):   Mon - Fri 8 AM - 5 PM  [facility] [place]  MWF 12:30 to 3:00 or call to schedule an appointment  [phone]  Specific Provider options Psychology Today  [email or web address] click on find a therapist  enter your zip code left side and select or tailor a therapist for your specific need.   [facility] Provider Directory [email or web address]  (Medicaid)   Follow all drop down to find a provider  Social Support program [facility]  [facility] [place] or [email or web address] [address], [place], [place] Recovery support and educational   24- Hour Availability:   [facility]  [address] [place], [place] Crisis [phone]  [facility] Service of the [employer] [phone]  [place]  [phone]   [place]  [phone] (after hours)  Therapeutic Alternative/Mobile Crisis   [phone]  [place] National Suicide  Hotline  [phone] Diamantina [place])  Call 911 or go to emergency room  [facility]  [phone]);  [place] and [employer]  [phone]); [place], [place], [place], [place], [place], [place], [place]

## 2020-09-25 NOTE — Assessment & Plan Note (Addendum)
Appears worse compared to previous which may be multifactorial from SNRI or benzo withdrawal/nonadherence from running out of medication or increased stress.  Alprazolam and duloxetine refilled today.  Continue buspirone.  We will continue alprazolam at the same dose of 0.5 mg nightly for now given increased anxiety, will plan to reduce to 0.25 mg nightly at follow-up visit if tolerating well.  Therapy resources given again.

## 2020-09-26 ENCOUNTER — Other Ambulatory Visit: Payer: Self-pay

## 2020-09-26 ENCOUNTER — Other Ambulatory Visit: Payer: Self-pay | Admitting: Family Medicine

## 2020-09-26 MED ORDER — PANTOPRAZOLE SODIUM 40 MG PO TBEC: 40.0000 mg | DELAYED_RELEASE_TABLET | Freq: Two times a day (BID) | ORAL | 3 refills | Status: DC

## 2020-09-26 MED ORDER — ARIPIPRAZOLE 20 MG PO TABS: ORAL_TABLET | ORAL | 2 refills | Status: DC

## 2020-09-27 ENCOUNTER — Ambulatory Visit: Payer: Medicare Other | Attending: Family Medicine

## 2020-09-27 ENCOUNTER — Other Ambulatory Visit: Payer: Self-pay

## 2020-09-27 DIAGNOSIS — H8111 Benign paroxysmal vertigo, right ear: Secondary | ICD-10-CM | POA: Insufficient documentation

## 2020-09-27 DIAGNOSIS — R42 Dizziness and giddiness: Secondary | ICD-10-CM | POA: Diagnosis not present

## 2020-09-27 DIAGNOSIS — R2681 Unsteadiness on feet: Secondary | ICD-10-CM | POA: Diagnosis not present

## 2020-09-27 DIAGNOSIS — H8112 Benign paroxysmal vertigo, left ear: Secondary | ICD-10-CM | POA: Diagnosis not present
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
## 2020-10-02 ENCOUNTER — Other Ambulatory Visit: Payer: Self-pay

## 2020-10-02 ENCOUNTER — Telehealth: Payer: Self-pay | Admitting: Pharmacist

## 2020-10-02 MED ORDER — NORTRIPTYLINE HCL 75 MG PO CAPS: 75.0000 mg | ORAL_CAPSULE | Freq: Every day | ORAL | 0 refills | Status: DC

## 2020-10-02 NOTE — Telephone Encounter (Signed)
Patient contacted for follow/up of tobacco intake reduction attempt.   Since last contact patient reports smoking about the same amount ~ 1.5ppd (30 digs).   Medications currently being used;  nortriptyline '75mg'$  once daily She notes waking less 1-2 times per night  She also notes that she just restarted her alprazolam (Xanax) and thinks her sleep may be improved by better anxiety control.  Motivation to quit remains at a high level.   Goal for next week - stay on current dose of nortriptyline Attempt to taper from 30 to 25 cigs per day  Total time with patient call and documentation of interaction: 12 minutes.  F/U Phone call planned: 1 week.

## 2020-10-02 NOTE — Telephone Encounter (Signed)
-----   Message from Leavy Cella, [place] sent at 09/26/2020 11:23 AM EDT ----- Regarding: nortriptyline dose increase - restart

## 2020-10-05 ENCOUNTER — Other Ambulatory Visit: Payer: Self-pay

## 2020-10-05 ENCOUNTER — Ambulatory Visit: Payer: Medicare Other | Admitting: Physical Therapy

## 2020-10-05 DIAGNOSIS — H8111 Benign paroxysmal vertigo, right ear: Secondary | ICD-10-CM | POA: Diagnosis not present

## 2020-10-05 DIAGNOSIS — R2681 Unsteadiness on feet: Secondary | ICD-10-CM | POA: Diagnosis not present

## 2020-10-05 DIAGNOSIS — R42 Dizziness and giddiness: Secondary | ICD-10-CM | POA: Diagnosis not present

## 2020-10-05 DIAGNOSIS — H8112 Benign paroxysmal vertigo, left ear: Secondary | ICD-10-CM
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
## 2020-10-12 ENCOUNTER — Other Ambulatory Visit: Payer: Self-pay

## 2020-10-12 ENCOUNTER — Ambulatory Visit: Payer: Medicare Other | Attending: Family Medicine | Admitting: Physical Therapy

## 2020-10-12 VITALS — BP 130/85 | HR 79

## 2020-10-12 DIAGNOSIS — R42 Dizziness and giddiness: Secondary | ICD-10-CM | POA: Insufficient documentation

## 2020-10-12 DIAGNOSIS — H8111 Benign paroxysmal vertigo, right ear: Secondary | ICD-10-CM | POA: Insufficient documentation

## 2020-10-12 DIAGNOSIS — H8112 Benign paroxysmal vertigo, left ear: Secondary | ICD-10-CM | POA: Insufficient documentation
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
## 2020-10-12 NOTE — Patient Instructions (Signed)
How to Perform the Epley Maneuver The Epley maneuver is an exercise that relieves symptoms of vertigo. Vertigo is the feeling that you or your surroundings are moving when they are not. When you feel vertigo, you may feel like the room is spinning and may have trouble walking. The Epley maneuver is used for a type of vertigo caused by a calcium deposit in a part of the inner ear. The maneuver involves changing head positions to help the deposit move out of the area. You can do this maneuver at home whenever you have symptoms of vertigo. You can repeat it in 24 hours if your vertigo has not gone away. Even though the Epley maneuver may relieve your vertigo for a few weeks, it is possible that your symptoms will return. This maneuver relieves vertigo, but it does not relieve dizziness. What are the risks? If it is done correctly, the Epley maneuver is considered safe. Sometimes it can lead to dizziness or nausea that goes away after a short time. If you develop other symptoms--such as changes in vision, weakness, or numbness--stop doing the maneuver and call your health care provider. Supplies needed: A bed or table. A pillow. How to do the Epley maneuver   Sit on the edge of a bed or table with your back straight and your legs extended or hanging over the edge of the bed or table. Turn your head halfway toward the affected ear or side as told by your health care provider. Lie backward quickly with your head turned until you are lying flat on your back. Your head should dangle (head-hanging position). You may want to position a pillow under your shoulders. Hold this position for at least 30 seconds. If you feel dizzy or have symptoms of vertigo, continue to hold the position until the symptoms stop. Turn your head to the opposite direction until your unaffected ear is facing down. Your head should continue to dangle. Hold this position for at least 30 seconds. If you feel dizzy or have symptoms of  vertigo, continue to hold the position until the symptoms stop. Turn your whole body to the same side as your head so that you are positioned on your side. Your head will now be nearly facedown and no longer needs to dangle. Hold for at least 30 seconds. If you feel dizzy or have symptoms of vertigo, continue to hold the position until the symptoms stop. Sit back up. You can repeat the maneuver in 24 hours if your vertigo does not go away. Follow these instructions at home: For 24 hours after doing the Epley maneuver: Keep your head in an upright position. When lying down to sleep or rest, keep your head raised (elevated) with two or more pillows. Avoid excessive neck movements. Activity Do not drive or use machinery if you feel dizzy. After doing the Epley maneuver, return to your normal activities as told by your health care provider. Ask your health care provider what activities are safe for you. General instructions Drink enough fluid to keep your urine pale yellow. Do not drink alcohol. Take over-the-counter and prescription medicines only as told by your health care provider. Keep all follow-up visits. This is important. Preventing vertigo symptoms Ask your health care provider if there is anything you should do at home to prevent vertigo. He or she may recommend that you: Keep your head elevated with two or more pillows while you sleep. Do not sleep on the side of your affected ear. Get up slowly  from bed. Avoid sudden movements during the day. Avoid extreme head positions or movement, such as looking up or bending over. Contact a health care provider if: Your vertigo gets worse. You have other symptoms, including: Nausea. Vomiting. Headache. Get help right away if you: Have vision changes. Have a headache or neck pain that is severe or getting worse. Cannot stop vomiting. Have new numbness or weakness in any part of your body. These symptoms may represent a serious problem  that is an emergency. Do not wait to see if the symptoms will go away. Get medical help right away. Call your local emergency services (911 in the [place] Do not drive yourself to the hospital. Summary Vertigo is the feeling that you or your surroundings are moving when they are not. The Epley maneuver is an exercise that relieves symptoms of vertigo. If the Epley maneuver is done correctly, it is considered safe. This information is not intended to replace advice given to you by your health care provider. Make sure you discuss any questions you have with your health care provider. Document Revised: 12/29/2019 Document Reviewed: 12/29/2019 Elsevier Patient Education  [place] for Right Posterior / Anterior Canalithiasis    Sitting on bed: 1. Turn head 45 right. (a) Lie back slowly, shoulders on pillow, head on bed. (b) Hold _20___ seconds. 2. Keeping head on bed, turn head 90 left. Hold __20__ seconds. 3. Roll to left, head on 45 angle down toward bed. Hold _20___ seconds. 4. Sit up on left side of bed. Repeat __3__ times per session. Do __2__ sessions per day.    Sit to Side-Lying    Sit on edge of bed. 1. Turn head 45 to right. 2. Maintain head position and lie down slowly on left side. Hold until symptoms subside. 3. Sit up slowly. Hold until symptoms subside. 4. Turn head 45 to left. 5. Maintain head position and lie down slowly on right side. Hold until symptoms subside. 6. Sit up slowly. Repeat sequence __5__ times per session. Do ___2_ sessions per day.  Copyright  VHI. All rights reserved.

## 2020-10-13 ENCOUNTER — Telehealth: Payer: Self-pay | Admitting: Pharmacist

## 2020-10-13 NOTE — Telephone Encounter (Signed)
-----   Message from Leavy Cella, [place] sent at 10/02/2020 10:26 AM EDT ----- Regarding: 25 cigs per day or less with nortriptyline '75mg'$  nightly?

## 2020-10-13 NOTE — Telephone Encounter (Signed)
Appreciate the follow-up call

## 2020-10-13 NOTE — Telephone Encounter (Signed)
Patient contacted for follow/up of tobacco intake reduction.   Since last contact patient reports reducing intake from 25 to 20 cigarettes (1 pack) per day. She reports no longer smoking when she awakens during the night (previously smoking up to 2 cigs per night awakening).  She state her sleep is improved on nortriptyline.    Medications currently being used;  nortriptyline '75mg'$   Rates IMPORTANCE of quitting tobacco remains high.  We agreed on a further reduction plan of "less than a pack per day" in the next two weeks.     Total time with patient call and documentation of interaction: 12 minutes.  F/U Phone call planned: 2 weeks.

## 2020-10-17 ENCOUNTER — Encounter: Payer: Medicare Other | Admitting: Physical Therapy

## 2020-10-19 NOTE — Patient Instructions (Addendum)
It was nice seeing you today!  Cutting back Xanax to 0.25 mg.  Follow-up in 1 month.  Please arrive at least 15 minutes prior to your scheduled appointments.  Stay well, Zola Button, MD [place] [phone]
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
## 2020-10-20 ENCOUNTER — Ambulatory Visit: Payer: Medicare Other | Admitting: Physical Therapy

## 2020-10-20 ENCOUNTER — Other Ambulatory Visit: Payer: Self-pay

## 2020-10-20 ENCOUNTER — Encounter: Payer: Self-pay | Admitting: Family Medicine

## 2020-10-20 ENCOUNTER — Ambulatory Visit (INDEPENDENT_AMBULATORY_CARE_PROVIDER_SITE_OTHER): Payer: Medicare Other | Admitting: Family Medicine

## 2020-10-20 VITALS — BP 132/85 | HR 71 | Ht 68.0 in | Wt 241.6 lb

## 2020-10-20 DIAGNOSIS — R42 Dizziness and giddiness: Secondary | ICD-10-CM | POA: Diagnosis not present

## 2020-10-20 DIAGNOSIS — F339 Major depressive disorder, recurrent, unspecified: Secondary | ICD-10-CM | POA: Diagnosis not present

## 2020-10-20 DIAGNOSIS — H8111 Benign paroxysmal vertigo, right ear: Secondary | ICD-10-CM | POA: Diagnosis not present

## 2020-10-20 DIAGNOSIS — F419 Anxiety disorder, unspecified: Secondary | ICD-10-CM | POA: Diagnosis not present

## 2020-10-20 DIAGNOSIS — H8112 Benign paroxysmal vertigo, left ear: Secondary | ICD-10-CM | POA: Diagnosis not present

## 2020-10-20 DIAGNOSIS — Z23 Encounter for immunization: Secondary | ICD-10-CM

## 2020-10-20 DIAGNOSIS — F17200 Nicotine dependence, unspecified, uncomplicated: Secondary | ICD-10-CM | POA: Diagnosis not present

## 2020-10-20 MED ORDER — ALPRAZOLAM 0.5 MG PO TABS: 0.2500 mg | ORAL_TABLET | Freq: Every day | ORAL | 0 refills | Status: DC

## 2020-10-20 NOTE — Assessment & Plan Note (Signed)
Currently undergoing vestibular rehab with improvement.
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
## 2020-10-20 NOTE — Assessment & Plan Note (Signed)
Will further reduce alprazolam to 0.25 mg nightly and plan to recheck in 1 month.  She will continue on buspirone, duloxetine, aripiprazole, nortriptyline.

## 2020-10-25 ENCOUNTER — Telehealth: Payer: Self-pay | Admitting: *Deleted

## 2020-10-25 NOTE — Chronic Care Management (AMB) (Signed)
  Care Management   Outreach Note  10/25/2020 Name: Shelby [name] MRN: [id number] DOB: 01-11-1976  Referred by: Zola Button, MD Reason for referral : Care Coordination (Initial outreach to schedule referral with Licensed Clinical SW )   An unsuccessful telephone outreach was attempted today. The patient was referred to the case management team for assistance with care management and care coordination.   Follow Up Plan:  A HIPAA compliant phone message was left for the patient providing contact information and requesting a return call. The care management team will reach out to the patient again over the next 7 days. If patient returns call to provider office, please advise to call [place] at [phone].  [place] Management  Direct Dial: [phone]

## 2020-10-26 ENCOUNTER — Telehealth: Payer: Self-pay | Admitting: Pharmacist

## 2020-10-26 NOTE — Telephone Encounter (Signed)
Noted and agree. 

## 2020-10-26 NOTE — Telephone Encounter (Signed)
Patient contacted for follow/up of tobacco intake reduction attempt.   Since last contact patient reports reducing her intake to approximately 12 cigarettes per day (reduced from 20 per day). She states she has noticed more    Medications currently being used;  Nortriptyline - '75mg'$  QHS  Patient denies any significant side effects from tobacco cessation therapy.   Rates IMPORTANCE of quitting tobacco at a high level.  Most common triggers to use tobacco include; habit   Motivation to quit: health   Goal intake is to reduce to 10 cigarettes or less per day over the next two weeks.   Total time with patient call and documentation of interaction: 16 minutes.  F/U Phone call planned: 2 weeks

## 2020-10-27 ENCOUNTER — Ambulatory Visit: Payer: Medicare Other

## 2020-10-30 ENCOUNTER — Other Ambulatory Visit: Payer: Self-pay | Admitting: Family Medicine

## 2020-10-30 ENCOUNTER — Telehealth: Payer: Self-pay

## 2020-10-30 MED ORDER — ALPRAZOLAM 0.25 MG PO TABS: 0.2500 mg | ORAL_TABLET | Freq: Every evening | ORAL | 0 refills | Status: DC | PRN

## 2020-10-30 NOTE — Telephone Encounter (Signed)
Patient calls nurse line stating she has not been able to pick up 9/9 Alprazolam prescription. I called the pharmacy and the prescription was flagged for (2) reasons. Pharmacist reports since the dosage changed for weaning purposes, the prescription needs to be written for .'25mg'$  at bedtime. PCP [facility] is also being flagged in the system. Pharmacist has cancelled 9/9 prescription.   Will forward to morning preceptor to send in.

## 2020-11-01 DIAGNOSIS — R42 Dizziness and giddiness: Secondary | ICD-10-CM | POA: Diagnosis not present

## 2020-11-01 DIAGNOSIS — G4489 Other headache syndrome: Secondary | ICD-10-CM | POA: Diagnosis not present

## 2020-11-03 NOTE — Chronic Care Management (AMB) (Signed)
  Care Management   Outreach Note  11/03/2020 Name: Shelby [name] MRN: [id number] DOB: [birth year]  Referred by: Zola Button, MD Reason for referral : Care Coordination (Initial outreach to schedule referral with Licensed Clinical SW )   A second unsuccessful telephone outreach was attempted today. The patient was referred to the case management team for assistance with care management and care coordination.   Follow Up Plan:  A HIPAA compliant phone message was left for the patient providing contact information and requesting a return call. The care management team will reach out to the patient again over the next 7 days.  If patient returns call to provider office, please advise to call [place] at [phone].  [place] Management  Direct Dial: [phone]

## 2020-11-06 NOTE — Chronic Care Management (AMB) (Signed)
  Care Management   Outreach Note  11/06/2020 Name: Shelby [name] MRN: [id number] DOB: 05-11-1975  Referred by: Zola Button, MD Reason for referral : Care Coordination (Initial outreach to schedule referral with Licensed Clinical SW )   Third unsuccessful telephone outreach was attempted today. The patient was referred to the case management team for assistance with care management and care coordination. The patient's primary care provider has been notified of our unsuccessful attempts to make or maintain contact with the patient. The care management team is pleased to engage with this patient at any time in the future should he/she be interested in assistance from the care management team.   Follow Up Plan:  We have been unable to make contact with the patient. The care management team is available to follow up with the patient after provider conversation with the patient regarding recommendation for care management engagement and subsequent re-referral to the care management team.   [place] Management  Direct Dial: [phone]

## 2020-11-14 ENCOUNTER — Telehealth: Payer: Self-pay | Admitting: Pharmacist

## 2020-11-14 NOTE — Telephone Encounter (Signed)
-----   Message from Leavy Cella, [place] sent at 10/26/2020 11:40 AM EDT ----- Regarding: Tobacco Cessation

## 2020-11-14 NOTE — Telephone Encounter (Signed)
Attempted to contact patient for follow-up of tobacco intake reduction.    Left HIPAA compliant voice mail.     Total time with patient call and documentation of interaction: 4 minutes.  Additional F/U Phone call planned: 1-2 days

## 2020-11-17 ENCOUNTER — Telehealth: Payer: Self-pay | Admitting: Pharmacist

## 2020-11-17 DIAGNOSIS — F17200 Nicotine dependence, unspecified, uncomplicated: Secondary | ICD-10-CM

## 2020-11-17 MED ORDER — VARENICLINE TARTRATE 0.5 MG PO TABS: 0.5000 mg | ORAL_TABLET | Freq: Every day | ORAL | 1 refills | Status: DC

## 2020-11-17 MED ORDER — NORTRIPTYLINE HCL 75 MG PO CAPS: 75.0000 mg | ORAL_CAPSULE | Freq: Every day | ORAL | 1 refills | Status: DC

## 2020-11-17 NOTE — Telephone Encounter (Signed)
-----   Message from Leavy Cella, [place] sent at 10/26/2020 11:40 AM EDT ----- Regarding: Tobacco Cessation

## 2020-11-17 NOTE — Telephone Encounter (Signed)
Patient contacted for follow/up of tobacco intake reduction / tobacco cessation attempt.   Since last contact patient reports continued intake of approximately 18-20 cigarettes per day "just less than a pack"   Medications currently being used; Nortriptyline 75mg  QHS  Stopped varenicline after worsened nightmares / vivid dreams with increased dose.   Patient denies any significant side effects from tobacco cessation therapy.   Rates IMPORTANCE of quitting tobacco as very high.   Following discussion of benefit versus risk of medications and review of side effects with varenicline and the potential use of lower dose (0.5mg  once daily ~ lunch)  New Rx for varenicline 0.5mg  once daily with food  And refill for nortriptyline 75ng each night Provided.  Total time with patient call and documentation of interaction: 16 minutes.  F/U Phone call planned: 2 weeks

## 2020-11-20 NOTE — Telephone Encounter (Signed)
Noted and agree. 

## 2020-11-23 ENCOUNTER — Other Ambulatory Visit: Payer: Self-pay | Admitting: Family Medicine

## 2020-12-04 ENCOUNTER — Telehealth: Payer: Self-pay | Admitting: Pharmacist

## 2020-12-04 NOTE — Telephone Encounter (Signed)
Late entry of phone conversation.   Patient contacted for follow/up of tobacco intake reduction.   Since last contact patient reports smoking ~ 1 ppd which is not a reduction from previous intake.    Medications currently being used;  Varenicline - 0.5mg  once daily Nortriptyline - 75mg  QHS Patient denies any significant side effects from tobacco cessation therapy.  Patient does report sleeping has improved and has NOT worsened with use of varenicline.  However, she also reports that she does not believe the varenicline has not helped much.    Rates IMPORTANCE of quitting tobacco remains high..  Most common triggers to use tobacco include; husband that smokes.    Motivation to quit: health/breathing   Total time with patient call and documentation of interaction: 13 minutes.  F/U Phone call planned: 2 weeks.

## 2020-12-04 NOTE — Telephone Encounter (Signed)
-----   Message from Leavy Cella, [place] sent at 11/17/2020 10:57 AM EDT ----- Regarding: Varenicline 0.5mg  once daily restart

## 2020-12-12 DIAGNOSIS — F32A Depression, unspecified: Secondary | ICD-10-CM | POA: Diagnosis not present

## 2020-12-12 DIAGNOSIS — Z9682 Presence of neurostimulator: Secondary | ICD-10-CM | POA: Diagnosis not present

## 2020-12-12 DIAGNOSIS — R638 Other symptoms and signs concerning food and fluid intake: Secondary | ICD-10-CM | POA: Diagnosis not present

## 2020-12-12 DIAGNOSIS — G4440 Drug-induced headache, not elsewhere classified, not intractable: Secondary | ICD-10-CM | POA: Diagnosis not present

## 2020-12-12 DIAGNOSIS — F419 Anxiety disorder, unspecified: Secondary | ICD-10-CM | POA: Diagnosis not present

## 2020-12-12 DIAGNOSIS — R42 Dizziness and giddiness: Secondary | ICD-10-CM | POA: Diagnosis not present

## 2020-12-12 DIAGNOSIS — K219 Gastro-esophageal reflux disease without esophagitis: Secondary | ICD-10-CM | POA: Diagnosis not present

## 2020-12-12 DIAGNOSIS — M5416 Radiculopathy, lumbar region: Secondary | ICD-10-CM | POA: Diagnosis not present

## 2020-12-12 DIAGNOSIS — R5383 Other fatigue: Secondary | ICD-10-CM | POA: Diagnosis not present

## 2020-12-12 DIAGNOSIS — Z87820 Personal history of traumatic brain injury: Secondary | ICD-10-CM | POA: Diagnosis not present

## 2020-12-12 DIAGNOSIS — G4700 Insomnia, unspecified: Secondary | ICD-10-CM | POA: Diagnosis not present

## 2020-12-12 DIAGNOSIS — G44309 Post-traumatic headache, unspecified, not intractable: Secondary | ICD-10-CM | POA: Diagnosis not present

## 2020-12-12 DIAGNOSIS — F17200 Nicotine dependence, unspecified, uncomplicated: Secondary | ICD-10-CM | POA: Diagnosis not present

## 2020-12-12 DIAGNOSIS — Z79899 Other long term (current) drug therapy: Secondary | ICD-10-CM | POA: Diagnosis not present

## 2020-12-12 DIAGNOSIS — G43719 Chronic migraine without aura, intractable, without status migrainosus: Secondary | ICD-10-CM | POA: Diagnosis not present

## 2020-12-12 DIAGNOSIS — G4730 Sleep apnea, unspecified: Secondary | ICD-10-CM | POA: Diagnosis not present

## 2020-12-12 DIAGNOSIS — R413 Other amnesia: Secondary | ICD-10-CM | POA: Diagnosis not present

## 2020-12-12 DIAGNOSIS — M5481 Occipital neuralgia: Secondary | ICD-10-CM | POA: Diagnosis not present

## 2020-12-14 ENCOUNTER — Telehealth: Payer: Self-pay | Admitting: Pharmacist

## 2020-12-14 NOTE — Telephone Encounter (Signed)
Excellent work, thank you

## 2020-12-14 NOTE — Telephone Encounter (Signed)
Patient contacted for follow/up of tobacco intake reduction / tobacco cessation attempt.   Since last contact patient reports reduction of intake to ~ 7 per day with range of 3-12 per day over the last week.     Medications currently being used; Varenicline - 0.5mg  once daily, Nortriptyline - 75mg  QHS  Patient denies any significant side effects from tobacco cessation therapy.   Rates IMPORTANCE of quitting tobacco as very high "really want to quit".  Patient has goal of quitting completely by Thanksgiving (3 weeks from now).   We agreed that I would contact her in 1 week.    Total time with patient call and documentation of interaction: 11 minutes.  F/U Phone call planned: 1 week

## 2020-12-14 NOTE — Telephone Encounter (Signed)
I agree with Dr Koval's assessment and plan. 

## 2020-12-14 NOTE — Telephone Encounter (Signed)
-----   Message from Leavy Cella, [place] sent at 12/04/2020 11:28 AM EDT ----- Regarding: Less than 1 ppd with use of nortriptyline and varenicline o.5mg  daily

## 2020-12-15 ENCOUNTER — Ambulatory Visit: Payer: Medicare Other | Admitting: Family Medicine

## 2020-12-19 ENCOUNTER — Telehealth: Payer: Self-pay

## 2020-12-19 NOTE — Telephone Encounter (Signed)
Patient calls nurse line reporting a metallic taste and smell for the last 5 days. Patient reports she did take a covid test and negative. Patient reports she is still smoking and currently has a fever blister. Patient has no other symptoms. No fever or abdominal pain. Will forward to PCP for recommendations.

## 2020-12-19 NOTE — Telephone Encounter (Signed)
Patient advised of recommendations. Patient plans to FU on 11/18 with PCP.

## 2020-12-19 NOTE — Telephone Encounter (Signed)
Metallic taste can sometimes be caused by acid reflux. If she is still taking pantoprazole twice daily, I would recommend adding OTC Gaviscon and non-pharmacological measures (smaller meals, avoiding triggers - caffeine, spicy or acidic foods, fatty/fried foods, alcohol, smoking).  I recommend OTC Orajel if she is having mouth discomfort.  She has an upcoming appointment with me 11/18. If unable to wait until then, she can schedule a separate appointment earlier if available or can recommend urgent care.

## 2020-12-20 ENCOUNTER — Ambulatory Visit: Payer: Medicare Other | Admitting: Family Medicine

## 2020-12-28 ENCOUNTER — Telehealth: Payer: Self-pay | Admitting: Pharmacist

## 2020-12-28 DIAGNOSIS — F17200 Nicotine dependence, unspecified, uncomplicated: Secondary | ICD-10-CM
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
## 2020-12-28 NOTE — Patient Instructions (Addendum)
It was nice seeing you today!  Congratulations on quitting smoking!  Try Gaviscon as needed for breakthrough acid reflux. This may help with the metallic taste. If you are still having symptoms, we may need to send you to a GI specialist.  Increase hydroxyzine to 50 mg three times a day as needed for anxiety and sleep.  Increase buspirone to 15 mg twice a day for anxiety.  Stop taking the varenicline (Chantix). You can use over-the-counter nicotine lozenges for irritability/nicotine withdrawal.  Repeat blood pressure looks good. Recommend monitoring your blood pressure at home if you can. Arm cuffs are more accurate than wrist cuffs. Please give [place] a call if you have a lot of high readings over 140/90.  Follow-up 1 month.  Please arrive at least 15 minutes prior to your scheduled appointments.  Stay well, Zola Button, MD [place] [phone]

## 2020-12-28 NOTE — Telephone Encounter (Signed)
-----   Message from Leavy Cella, [place] sent at 12/21/2020  2:26 PM EST ----- Regarding: FW: Additional reduction from 7 per day - Quit date is planned prior to T-day  ----- Message ----- From: Leavy Cella, RPH-CPP Sent: 12/21/2020   8:00 AM EST To: Leavy Cella, RPH-CPP Subject: Additional reduction from 7 per day - Quit d#

## 2020-12-29 ENCOUNTER — Other Ambulatory Visit: Payer: Self-pay

## 2020-12-29 ENCOUNTER — Ambulatory Visit (INDEPENDENT_AMBULATORY_CARE_PROVIDER_SITE_OTHER): Payer: Medicare Other | Admitting: Family Medicine

## 2020-12-29 ENCOUNTER — Encounter: Payer: Self-pay | Admitting: Family Medicine

## 2020-12-29 VITALS — BP 126/68 | HR 83 | Ht 68.0 in | Wt 242.0 lb

## 2020-12-29 DIAGNOSIS — F419 Anxiety disorder, unspecified: Secondary | ICD-10-CM

## 2020-12-29 DIAGNOSIS — K219 Gastro-esophageal reflux disease without esophagitis: Secondary | ICD-10-CM | POA: Diagnosis not present

## 2020-12-29 DIAGNOSIS — F17200 Nicotine dependence, unspecified, uncomplicated: Secondary | ICD-10-CM | POA: Diagnosis not present

## 2020-12-29 DIAGNOSIS — R030 Elevated blood-pressure reading, without diagnosis of hypertension: Secondary | ICD-10-CM

## 2020-12-29 DIAGNOSIS — F339 Major depressive disorder, recurrent, unspecified: Secondary | ICD-10-CM | POA: Diagnosis not present

## 2020-12-29 MED ORDER — HYDROXYZINE HCL 50 MG PO TABS: 50.0000 mg | ORAL_TABLET | Freq: Three times a day (TID) | ORAL | 2 refills | Status: DC | PRN

## 2020-12-29 MED ORDER — BUSPIRONE HCL 15 MG PO TABS: 15.0000 mg | ORAL_TABLET | Freq: Two times a day (BID) | ORAL | 2 refills | Status: DC

## 2020-12-29 MED ORDER — ALUM HYDROXIDE-MAG CARBONATE 160-105 MG PO CHEW: 2.0000 | CHEWABLE_TABLET | Freq: Four times a day (QID) | ORAL | 0 refills | Status: DC | PRN

## 2020-12-29 NOTE — Telephone Encounter (Signed)
Noted and agree. 

## 2020-12-29 NOTE — Assessment & Plan Note (Signed)
Anxiety and insomnia worsening, likely multifactorial (nicotine withdrawal, discontinuation of alprazolam, multiple medications, stressors).  Given amount of psychotropic medications she is on, ideally would be seeing psychiatry but patient is adamant against this. - d/c alprazolam - increase buspirone 15 mg BID - increase hydroxyzine TID prn - continue duloxetine, aripiprazole - recommended establishing with therapist - f/u 1 month

## 2020-12-29 NOTE — Assessment & Plan Note (Signed)
Quit smoking 2 days ago, congratulated patient.  Given nicotine lozenges to use as needed for cravings.

## 2020-12-29 NOTE — Assessment & Plan Note (Signed)
Unclear if metallic taste in mouth is secondary to GERD or medication side effect.  Recommended adding Gaviscon in addition to PPI for breakthrough acid reflux.  If symptoms are persistent, consider GI evaluation.

## 2020-12-29 NOTE — Telephone Encounter (Signed)
Patient contacted for follow/up of tobacco cessation attempt.   Since last contact patient reports she has Quit for 2 days.  She states she is having significant withdrawal and cravings.  She reports they are the worst when her husband has been smoking and he comes around her OR if he smokes in the car when she is a passenger.    Medications currently being used;  Varenicline -  Nortriptyline -   Patient denies any significant side effects from tobacco cessation therapy.   Rates IMPORTANCE of quitting tobacco on 1-10 scale of 10. Rates CONFIDENCE of quitting tobacco on 1-10 scale of 6.  Most common triggers to use tobacco include; being around other smokers   Congratulated success and encouraged continued complete abstinence.   Total time with patient call and documentation of interaction: 17 minutes.  F/U Phone call planned: 6 days

## 2020-12-29 NOTE — Assessment & Plan Note (Signed)
Patient contacted for follow/up of tobacco cessation attempt.   Since last contact patient reports she has Quit for 2 days.  She states she is having significant withdrawal and cravings.  She reports they are the worst when her husband has been smoking and he comes around her OR if he smokes in the car when she is a passenger.    Medications currently being used;  Varenicline -  Nortriptyline -   Patient denies any significant side effects from tobacco cessation therapy.   Rates IMPORTANCE of quitting tobacco on 1-10 scale of 10. Rates CONFIDENCE of quitting tobacco on 1-10 scale of 6.

## 2021-01-02 ENCOUNTER — Telehealth: Payer: Self-pay | Admitting: Internal Medicine

## 2021-01-02 NOTE — Telephone Encounter (Signed)
PATIENT CALLED AND ASKED IF WE KNEW WHAT COULD BE PUTTING A METAL TASTE IN HER MOUTH EVERY TIME SHE ATE.

## 2021-01-02 NOTE — Telephone Encounter (Signed)
Pt was last seen 03/30/19. Pt is having a metallic taste in her mouth every time she eats. Please advise.

## 2021-01-02 NOTE — Telephone Encounter (Signed)
Pt was made aware and verbalized understanding.  

## 2021-01-02 NOTE — Telephone Encounter (Signed)
I'm not sure what is causing her to have metallic taste in her mouth. Possibly a medication she is taking. Would advise she discuss with her PCP.

## 2021-01-03 ENCOUNTER — Telehealth: Payer: Self-pay | Admitting: Pharmacist

## 2021-01-03 NOTE — Telephone Encounter (Signed)
-----   Message from Leavy Cella, [place] sent at 12/29/2020 11:54 AM EST ----- Regarding: QUIT smoking 11/15 - remained quit?

## 2021-01-03 NOTE — Telephone Encounter (Signed)
Attempted to contact patient for follow-up of tobacco intake reduction / cessation.    Left HIPAA compliant voice mail  Additional F/U Phone call planned: 5-7 days by phone.

## 2021-01-08 ENCOUNTER — Telehealth: Payer: Self-pay | Admitting: Pharmacist

## 2021-01-08 NOTE — Telephone Encounter (Signed)
-----   Message from Leavy Cella, [place] sent at 01/03/2021  2:19 PM EST ----- Regarding: Quit?

## 2021-01-08 NOTE — Telephone Encounter (Signed)
Attempted to contact patient for follow-up of tobacco intake reduction / cessation.    Left HIPAA compliant voice mail - I will plan to call again later in the week.    Total time with patient call and documentation of interaction: 7 minutes.  Additional F/U Phone call planned: 2-3 days.

## 2021-01-11 ENCOUNTER — Telehealth: Payer: Self-pay | Admitting: Pharmacist

## 2021-01-11 NOTE — Telephone Encounter (Signed)
-----   Message from Leavy Cella, [place] sent at 01/08/2021 11:01 AM EST ----- Regarding: Quit?

## 2021-01-11 NOTE — Telephone Encounter (Signed)
Attempted to contact patient for follow-up of tobacco intake reduction / cessation.    Left HIPAA compliant voice mail requesting call back to direct phone: [phone]   Total time with patient call and documentation of interaction: 6 minutes.  Additional F/U Phone call planned: 3-5 days.

## 2021-01-16 ENCOUNTER — Telehealth: Payer: Self-pay | Admitting: Pharmacist

## 2021-01-16 DIAGNOSIS — F17200 Nicotine dependence, unspecified, uncomplicated: Secondary | ICD-10-CM

## 2021-01-16 NOTE — Telephone Encounter (Signed)
Patient contacted for follow/up of tobacco intake reduction / tobacco cessation attempt.   Since last contact patient reports she was able to [place] for 12 days.  Unfortuately, she has relapsed and reports smoking ~ 1 ppd   Medications currently being used;  Varenicline - stopped and does not want to restart.  Nortriptyline - 75mg  QHS  Patient reports varenicline side effects was not acceptable and self-discontinued.    Rates IMPORTANCE of quitting tobacco as high. Patient would like to quit completely again by the end of the year (3 weeks).    Most common triggers to use tobacco include; stress at work.     Total time with patient call and documentation of interaction: 18 minutes.  F/U Phone call planned: 2 weeks.

## 2021-01-16 NOTE — Assessment & Plan Note (Signed)
Patient contacted for follow/up of tobacco intake reduction / tobacco cessation attempt.   Since last contact patient reports she was able to [place] for 12 days.  Unfortuately, she has relapsed and reports smoking ~ 1 ppd   Medications currently being used;  Varenicline - stopped and does not want to restart.  Nortriptyline - 75mg  QHS  Patient reports varenicline side effects was not acceptable and self-discontinued.    Rates IMPORTANCE of quitting tobacco as high. Patient would like to quit completely again by the end of the year (3 weeks).    Most common triggers to use tobacco include; stress at work.     Total time with patient call and documentation of interaction: 18 minutes.  F/U Phone call planned: 2 weeks.

## 2021-01-16 NOTE — Telephone Encounter (Signed)
Noted and agree. 

## 2021-01-16 NOTE — Telephone Encounter (Signed)
-----   Message from Leavy Cella, [place] sent at 01/11/2021 11:46 AM EST ----- Regarding: Tobacco Cessation Follow-up

## 2021-01-30 ENCOUNTER — Telehealth: Payer: Self-pay | Admitting: Pharmacist

## 2021-01-30 NOTE — Telephone Encounter (Signed)
Attempted to contact patient for follow-up of tobacco intake reduction / cessation.    Left HIPAA compliant voice mail  Attempted calls on 12/19 and today 12/20 - left message X2   Total time with patient call and documentation of interaction: 9 minutes.  Additional F/U Phone call planned: in 2 weeks (early January)

## 2021-02-13 DIAGNOSIS — M25551 Pain in right hip: Secondary | ICD-10-CM | POA: Diagnosis not present

## 2021-02-15 DIAGNOSIS — M1611 Unilateral primary osteoarthritis, right hip: Secondary | ICD-10-CM | POA: Diagnosis not present

## 2021-02-20 ENCOUNTER — Telehealth: Payer: Self-pay | Admitting: Pharmacist

## 2021-02-20 DIAGNOSIS — F17200 Nicotine dependence, unspecified, uncomplicated: Secondary | ICD-10-CM

## 2021-02-20 NOTE — Telephone Encounter (Signed)
-----   Message from Leavy Cella, [place] sent at 01/30/2021 11:00 AM EST ----- Regarding: Tobacco Cessation?

## 2021-02-20 NOTE — Telephone Encounter (Signed)
Patient contacted for follow/up of tobacco intake reduction / tobacco cessation attempt.  Since last contact patient reports smoking only 1/2 ppd (less than previous 2ppd). Patient states she is very interested in continued work on quitting smoking.   Medications currently being used;  Nortriptyline - QHS Patient denies any significant side effects from tobacco cessation therapy.   Rates IMPORTANCE of quitting tobacco as high Rates CONFIDENCE of quitting tobacco as moderate but plans a quit attempt within the next 30 days.  Most common triggers to use tobacco include; work stress.  Discussed use of gum as patient had been using candy with previous quit attempt.    Motivation to quit: breathing and becoming smoke free.   Total time with patient call and documentation of interaction: 12 minutes. F/U Phone call planned: 1 month

## 2021-02-20 NOTE — Assessment & Plan Note (Signed)
Patient contacted for follow/up of tobacco intake reduction / tobacco cessation attempt.  Since last contact patient reports smoking only 1/2 ppd (less than previous 2ppd). Patient states she is very interested in continued work on quitting smoking.   Medications currently being used;  Nortriptyline - QHS Patient denies any significant side effects from tobacco cessation therapy.   Rates IMPORTANCE of quitting tobacco as high Rates CONFIDENCE of quitting tobacco as moderate but plans a quit attempt within the next 30 days.  Most common triggers to use tobacco include; work stress.  Discussed use of gum as patient had been using candy with previous quit attempt.

## 2021-02-20 NOTE — Telephone Encounter (Signed)
Noted and agree. 

## 2021-03-09 ENCOUNTER — Other Ambulatory Visit: Payer: Self-pay | Admitting: Obstetrics & Gynecology

## 2021-03-12 ENCOUNTER — Other Ambulatory Visit: Payer: Self-pay | Admitting: *Deleted

## 2021-03-12 ENCOUNTER — Telehealth: Payer: Self-pay | Admitting: Obstetrics & Gynecology

## 2021-03-12 MED ORDER — ESTRADIOL 2 MG PO TABS: ORAL_TABLET | ORAL | 11 refills | Status: DC

## 2021-03-12 NOTE — Telephone Encounter (Signed)
Called and left vm that refill was sent in.

## 2021-03-12 NOTE — Telephone Encounter (Signed)
Patient called stating that her pharmacy sent over e refill request for Dr. Elonda Husky to refill her medication. I informed patient that Dr. Elonda Husky was not in the office last week, but he is in today and he will be able to refill her medication. Pt would like a call once he has sent the medication refill in.

## 2021-03-23 ENCOUNTER — Telehealth: Payer: Self-pay | Admitting: Pharmacist

## 2021-03-23 DIAGNOSIS — F17200 Nicotine dependence, unspecified, uncomplicated: Secondary | ICD-10-CM

## 2021-03-23 MED ORDER — NICOTINE POLACRILEX 2 MG MT LOZG: 2.0000 mg | LOZENGE | OROMUCOSAL | 2 refills | Status: DC | PRN

## 2021-03-23 NOTE — Telephone Encounter (Signed)
Noted and agree. 

## 2021-03-23 NOTE — Telephone Encounter (Signed)
-----   Message from Leavy Cella, [place] sent at 02/20/2021 12:06 PM EST ----- Regarding: Tobacco Reduction and planned quit attempt in 30 days

## 2021-03-23 NOTE — Assessment & Plan Note (Signed)
Patient contacted for follow/up of tobacco intake reduction attempt. Since last contact patient reports increasing smoking back to ~ 1 ppd.   Medications currently being used;  Nortriptyline - but his is not helping sleep.  Patient reports willingness to try anything.   We reviewed options agreed that varenicline was not an option for her.   She reports itching with patches, can't use gum due to lack of teeth, and had vomiting with nicotine lozenges (which she believes were 4mg ).   She was willing to use 2mg  lozenges to evaluate if she can tolerate.  Rates IMPORTANCE of quitting tobacco remains high.   Most common triggers to use tobacco include: stress  Motivation to quit: upcoming birth of grandchild.

## 2021-03-23 NOTE — Telephone Encounter (Signed)
Patient contacted for follow/up of tobacco intake reduction attempt. Since last contact patient reports increasing smoking back to ~ 1 ppd.   Medications currently being used;  Nortriptyline - but his is not helping sleep.  Patient reports willingness to try anything.   We reviewed options agreed that varenicline was not an option for her.   She reports itching with patches, can't use gum due to lack of teeth, and had vomiting with nicotine lozenges (which she believes were 4mg ).   She was willing to use 2mg  lozenges to evaluate if she can tolerate.  Rates IMPORTANCE of quitting tobacco remains high.   Most common triggers to use tobacco include: stress  Motivation to quit: upcoming birth of grandchild.   Total time with patient call and documentation of interaction: 13 minutes.  F/U Phone call planned: 2 weeks

## 2021-03-29 ENCOUNTER — Ambulatory Visit: Payer: Medicare Other | Admitting: Student

## 2021-04-02 ENCOUNTER — Ambulatory Visit (INDEPENDENT_AMBULATORY_CARE_PROVIDER_SITE_OTHER): Payer: Medicare Other | Admitting: Family Medicine

## 2021-04-02 ENCOUNTER — Other Ambulatory Visit: Payer: Self-pay

## 2021-04-02 ENCOUNTER — Encounter: Payer: Self-pay | Admitting: Family Medicine

## 2021-04-02 VITALS — BP 142/78 | HR 86 | Wt 239.4 lb

## 2021-04-02 DIAGNOSIS — F39 Unspecified mood [affective] disorder: Secondary | ICD-10-CM

## 2021-04-02 NOTE — Assessment & Plan Note (Addendum)
PHQ-9 score 21, GAD score 20, MDQ screening positive.  Denies any SI/HI.  Given positive MDQ screening, possible history of bipolar recommend patient go to [facility], located [place] today for evaluation by mental health provider. Patient agreeable to go directly from clinic with husband.  I called [facility], [phone] and they accept walk in patients.  I provided the patient with the number to [facility].Patient will need to establish care with psychiatrist for medication management. -Information was all given to patient and husband on AVS. -Referral to psychiatry -Follow-up PCP in 1 week -Strict return precautions provided

## 2021-04-02 NOTE — Patient Instructions (Addendum)
Thank you for coming to see me today. It was a pleasure. Today we talked about going to [facility] from clinic today.  [facility] behavioral urgent care.  This is located [place].  The number is [phone].  You can walk-in and see a behavioral health provider who can manage her medications.  Please have them set you up with an appointment for psychiatrist.  Please follow-up with PCP in 1 week.  If you have any questions or concerns, please do not hesitate to call the office at [phone].  Best,   Carollee Leitz, MD    Psychiatry Resource List (Adults and Children) Most of these providers will take Medicaid. please consult your insurance for a complete and updated list of available providers. When calling to make an appointment have your insurance information available to confirm you are covered.  [place]:   [place]: [address]     [phone] [place]: [place],        [phone] [place]: [address] [place],    [place]: [address] [place],                   [place]  (Psychiatry & counseling ; adults & children ; will take Medicaid) [place], [place], [place]        [phone]   [place]  (Psychiatry only; Adults only, will take Medicaid)  [place], [place], [place] [address]       [phone]   [place] (Psychiatry & counseling ; adults & children ; will take Medicaid [place] [address]  [place] [address]   [phone]    ([place] therapist)  [facility]  Psychiatry & counseling; Adults and children;  [place] [address]    [place],  [address]    [phone]  [facility] (Psychiatry & counseling; adults & children; Medicare no Medicaid)  [place],   [address]      [phone]     [facility] (up to age 24)  Psychiatry & counseling ,will take Medicaid, must do counseling to receive psychiatry services  [address] [place] [address]        ([place] (Psychiatry & counseling; adults & children; will take Medicaid) [address],  [place], [place]  [phone]  [facility]---  Walk-in Mon-Fri, 8:30-5:00 (will take Medicaid)  [address] [place], [place]  [phone]    [facility] --- Walk-In Mon-Friday 8am-3pm ( will take Medicaid, Psychiatry, Adults & children,  [address], [place], [place]   [phone]   [facility] [place]--, Walk-in M-F 8am-12pm and 1pm -3pm   (Counseling, Psychiatry, will take Medicaid, adults & children)  [address], [place], [place]  [phone]

## 2021-04-02 NOTE — Progress Notes (Addendum)
° ° °  SUBJECTIVE:   CHIEF COMPLAINT / HPI: Depression  Patient reports decreased mood, increase in sleep 12 to 13 hours daily, tearful, lack of interest in daily activities and work that has been increasing.  Reports worsening fatigue since Atarax increased to 50 mg nightly.  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  She has run out of her Abilify 2 weeks ago.  Her husband lists her current medications as Abilify 20 mg nightly, Duloxetine 60 mg daily, Atarax 50 mg nightly, nortriptyline 75 mg nightly, Protonix 40 mg twice daily, estradiol 2 mg twice daily.  She denies any suicidal ideation or homicidal ideation.  She was provided with resources to call and establish  care with a psychiatrist or therapist but has been unable to do so.  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  No SI/HI, no auditory or visual hallucinations.   ASSESSMENT/PLAN:   Mood disorder (HCC) PHQ-9 score 21, GAD score 20, MDQ screening positive.  Denies any SI/HI.  Given positive MDQ screening, possible history of bipolar recommend patient go to [facility], located [place] today for evaluation by mental health provider. Patient agreeable to go directly from clinic with husband.  I called [facility], [phone] and they accept walk in patients.  I provided the patient with the number to [facility].Patient will need to establish care with psychiatrist for medication management. -Information was all given to patient and husband on AVS. -Referral to  psychiatry -Follow-up PCP in 1 week -Strict return precautions provided     Carollee Leitz, MD [place]

## 2021-04-03 DIAGNOSIS — M5451 Vertebrogenic low back pain: Secondary | ICD-10-CM | POA: Diagnosis not present

## 2021-04-06 ENCOUNTER — Telehealth: Payer: Self-pay | Admitting: Pharmacist

## 2021-04-06 DIAGNOSIS — F17200 Nicotine dependence, unspecified, uncomplicated: Secondary | ICD-10-CM

## 2021-04-06 NOTE — Telephone Encounter (Signed)
-----   Message from Leavy Cella, [place] sent at 03/23/2021 10:30 AM EST ----- Regarding: lozenges 2 mg - tolerating ? cut down?

## 2021-04-06 NOTE — Telephone Encounter (Signed)
Noted and agree. 

## 2021-04-06 NOTE — Telephone Encounter (Signed)
Patient contacted for follow/up of tobacco intake reduction. Since last contact patient reports "life has been stressful" and states she has been smoking more.  She reports smoking ~ 1 ppd.    Medications currently being used; None  Motivation to quit: health and wants to quit prior to birth of grandson in May  Total time with patient call and documentation of interaction: 11 minutes.  We agreed on a quantity taper to 1/2 ppd by the end of March.  She was amenable to a follow-up call to continue working on her goal of quitting completely.   F/U Phone call planned: 1 month.

## 2021-04-06 NOTE — Assessment & Plan Note (Signed)
Patient contacted for follow/up of tobacco intake reduction. Since last contact patient reports "life has been stressful" and states she has been smoking more.  She reports smoking ~ 1 ppd.    Medications currently being used; None  Motivation to quit: health and wants to quit prior to birth of grandson in May  Total time with patient call and documentation of interaction: 11 minutes.  We agreed on a quantity taper to 1/2 ppd by the end of March.  She was amenable to a follow-up call to continue working on her goal of quitting completely.   F/U Phone call planned: 1 month.

## 2021-05-07 ENCOUNTER — Encounter (HOSPITAL_COMMUNITY): Payer: Self-pay | Admitting: Psychiatry

## 2021-05-07 ENCOUNTER — Ambulatory Visit (INDEPENDENT_AMBULATORY_CARE_PROVIDER_SITE_OTHER): Payer: Medicare Other | Admitting: Psychiatry

## 2021-05-07 DIAGNOSIS — F331 Major depressive disorder, recurrent, moderate: Secondary | ICD-10-CM

## 2021-05-07 DIAGNOSIS — F411 Generalized anxiety disorder: Secondary | ICD-10-CM

## 2021-05-07 DIAGNOSIS — G4719 Other hypersomnia: Secondary | ICD-10-CM

## 2021-05-07 DIAGNOSIS — F4323 Adjustment disorder with mixed anxiety and depressed mood: Secondary | ICD-10-CM | POA: Diagnosis not present

## 2021-05-07 MED ORDER — ARIPIPRAZOLE 20 MG PO TABS: ORAL_TABLET | ORAL | 2 refills | Status: DC

## 2021-05-07 MED ORDER — BUPROPION HCL ER (SR) 100 MG PO TB12: 100.0000 mg | ORAL_TABLET | Freq: Two times a day (BID) | ORAL | 1 refills | Status: DC
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
## 2021-05-09 ENCOUNTER — Other Ambulatory Visit (HOSPITAL_COMMUNITY): Payer: Self-pay | Admitting: Family Medicine

## 2021-05-09 ENCOUNTER — Other Ambulatory Visit (HOSPITAL_COMMUNITY): Payer: Self-pay | Admitting: Obstetrics & Gynecology

## 2021-05-09 DIAGNOSIS — Z1231 Encounter for screening mammogram for malignant neoplasm of breast: Secondary | ICD-10-CM

## 2021-05-10 ENCOUNTER — Telehealth: Payer: Self-pay | Admitting: Pharmacist

## 2021-05-10 NOTE — Telephone Encounter (Signed)
Patient contacted for follow/up of tobacco intake reduction attempt.  ? ?Since last contact patient reports smoking a little less but states it is still around 1 ppd.  She states that her stress has been "overwhelming" lately.  ? ?Rates IMPORTANCE of quitting tobacco remains high but stress is significant trigger for smoking.  ? ?Discussed stress and value of persistent follow-up.  Patient verbalized request for me to follow-up by phone in 1 month to reassess progress/interest in attempting to quit.  ? ?Total time with patient call and documentation of interaction: 11 minutes. ? ?F/U Phone call planned: 1 month ? ? ?

## 2021-05-10 NOTE — Telephone Encounter (Signed)
Noted and agree. 

## 2021-05-10 NOTE — Telephone Encounter (Signed)
-----   Message from Leavy Cella, [place] sent at 04/06/2021 11:49 AM EST ----- ?Regarding: Taper from 1 ppd to 1/2 ppd by end of March 2023 ? ? ?

## 2021-05-17 DIAGNOSIS — M5416 Radiculopathy, lumbar region: Secondary | ICD-10-CM | POA: Diagnosis not present

## 2021-05-17 DIAGNOSIS — M5136 Other intervertebral disc degeneration, lumbar region: Secondary | ICD-10-CM | POA: Diagnosis not present

## 2021-05-17 DIAGNOSIS — M961 Postlaminectomy syndrome, not elsewhere classified: Secondary | ICD-10-CM | POA: Diagnosis not present

## 2021-05-17 DIAGNOSIS — G894 Chronic pain syndrome: Secondary | ICD-10-CM | POA: Diagnosis not present

## 2021-05-28 DIAGNOSIS — Z978 Presence of other specified devices: Secondary | ICD-10-CM | POA: Diagnosis not present

## 2021-05-28 DIAGNOSIS — M5451 Vertebrogenic low back pain: Secondary | ICD-10-CM | POA: Diagnosis not present

## 2021-05-31 ENCOUNTER — Telehealth: Payer: Self-pay

## 2021-05-31 NOTE — Telephone Encounter (Signed)
A Prior Authorization was initiated for this patients PANTOPRAZOLE through CoverMyMeds.  ? ?Key: [id number] ? ?

## 2021-06-01 NOTE — Telephone Encounter (Signed)
Prior Auth for patients medication PANTOPRAZOLE approved by [employer] from 05/31/21 to 06/01/22. ? ?Key: [id number] ? ?Patients pharmacy notified. ? ?

## 2021-06-06 ENCOUNTER — Telehealth (INDEPENDENT_AMBULATORY_CARE_PROVIDER_SITE_OTHER): Payer: Medicare Other | Admitting: Psychiatry

## 2021-06-06 ENCOUNTER — Encounter (HOSPITAL_COMMUNITY): Payer: Self-pay | Admitting: Psychiatry

## 2021-06-06 ENCOUNTER — Encounter: Payer: Self-pay | Admitting: Nurse Practitioner

## 2021-06-06 DIAGNOSIS — F331 Major depressive disorder, recurrent, moderate: Secondary | ICD-10-CM | POA: Diagnosis not present

## 2021-06-06 DIAGNOSIS — F411 Generalized anxiety disorder: Secondary | ICD-10-CM

## 2021-06-06 DIAGNOSIS — R0683 Snoring: Secondary | ICD-10-CM

## 2021-06-06 DIAGNOSIS — G4719 Other hypersomnia: Secondary | ICD-10-CM

## 2021-06-06 DIAGNOSIS — F4323 Adjustment disorder with mixed anxiety and depressed mood: Secondary | ICD-10-CM | POA: Diagnosis not present

## 2021-06-06 MED ORDER — BUPROPION HCL ER (SR) 100 MG PO TB12: 100.0000 mg | ORAL_TABLET | Freq: Every day | ORAL | 0 refills | Status: DC
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
## 2021-06-14 ENCOUNTER — Telehealth: Payer: Self-pay | Admitting: Pharmacist

## 2021-06-14 NOTE — Telephone Encounter (Signed)
-----   Message from Leavy Cella, [place] sent at 05/10/2021  1:57 PM EDT ----- ?Regarding: 1 month f/u due to stress - smoking 1ppd  late march 2023 ? ? ?

## 2021-06-14 NOTE — Telephone Encounter (Signed)
Noted and agree. 

## 2021-06-14 NOTE — Telephone Encounter (Signed)
Patient contacted for follow/up of tobacco intake reduction / tobacco cessation attempt.  ? ?Since last contact patient reports she has been tapering down her use and is trying to quit today.  She has smokes 3 cigarettes today and has ~ 1/2 packs of cigarettes remaining at this time.   She was positive about her progress and plan to quit.  ? ?Medications currently being used - None ?Patient expressed willingness to use nicotine patch but reports inability to afford nicotine patches.  We discussed the option of "FREE" patches with use of the  1-800 quit now support line.  She expressed willingness to contact them and request support.  ? ?Rates CONFIDENCE of quitting tobacco as high. ? ?Most common triggers to use tobacco include; stress and being around other smokers.   ? ?Provided information on 1 800-QUIT NOW support program.  ? ?Total time with patient call and documentation of interaction: 13 minutes. ? ?F/U Phone call planned: 3 weeks.  ? ? ?

## 2021-06-25 ENCOUNTER — Ambulatory Visit (HOSPITAL_COMMUNITY)
Admission: RE | Admit: 2021-06-25 | Discharge: 2021-06-25 | Disposition: A | Discharge status: Home / Self Care | Payer: Medicare Other | Source: Ambulatory Visit | Attending: Obstetrics & Gynecology | Admitting: Obstetrics & Gynecology

## 2021-06-25 DIAGNOSIS — Z1231 Encounter for screening mammogram for malignant neoplasm of breast: Secondary | ICD-10-CM | POA: Diagnosis not present

## 2021-06-29 DIAGNOSIS — T8484XA Pain due to internal orthopedic prosthetic devices, implants and grafts, initial encounter: Secondary | ICD-10-CM | POA: Diagnosis not present

## 2021-07-05 ENCOUNTER — Telehealth: Payer: Self-pay | Admitting: Pharmacist

## 2021-07-05 ENCOUNTER — Emergency Department (HOSPITAL_COMMUNITY)
Admission: EM | Admit: 2021-07-05 | Discharge: 2021-07-05 | Discharge status: Against Medical Advice | Payer: Medicare Other | Attending: Emergency Medicine | Admitting: Emergency Medicine

## 2021-07-05 ENCOUNTER — Other Ambulatory Visit: Payer: Self-pay

## 2021-07-05 ENCOUNTER — Encounter (HOSPITAL_COMMUNITY): Payer: Self-pay | Admitting: Emergency Medicine

## 2021-07-05 DIAGNOSIS — L500 Allergic urticaria: Secondary | ICD-10-CM | POA: Diagnosis not present

## 2021-07-05 DIAGNOSIS — T7840XA Allergy, unspecified, initial encounter: Secondary | ICD-10-CM | POA: Diagnosis present

## 2021-07-05 DIAGNOSIS — Z5321 Procedure and treatment not carried out due to patient leaving prior to being seen by health care provider: Secondary | ICD-10-CM | POA: Insufficient documentation

## 2021-07-05 DIAGNOSIS — F17200 Nicotine dependence, unspecified, uncomplicated: Secondary | ICD-10-CM

## 2021-07-05 NOTE — Telephone Encounter (Signed)
-----   Message from Leavy Cella, [place] sent at 06/14/2021  1:28 PM EDT ----- Regarding: Tobacco Cessation - Quit with use of patches?

## 2021-07-05 NOTE — Assessment & Plan Note (Signed)
Patient contacted for follow/up of tobacco cessation attempt.   Since last contact patient reports that her surgery went well and she has not smoked in 2 weeks.  She is now in recovery from her surgery at home.   Medications currently being used; None   Rates IMPORTANCE of quitting tobacco as high Rates CONFIDENCE of staying quit from tobacco as high. Most common triggers to use tobacco include; stress   Total time with patient call and documentation of interaction: 11 minutes.  F/U Phone call planned: 3 weeks

## 2021-07-05 NOTE — ED Triage Notes (Signed)
Pt had surgery on 06/29/21 and thinks latex was used and currently having continuous itching and hives.

## 2021-07-05 NOTE — Telephone Encounter (Signed)
Patient contacted for follow/up of tobacco cessation attempt.   Since last contact patient reports that her surgery went well and she has not smoked in 2 weeks.  She is now in recovery from her surgery at home.   Medications currently being used; None   Rates IMPORTANCE of quitting tobacco as high Rates CONFIDENCE of staying quit from tobacco as high. Most common triggers to use tobacco include; stress   Total time with patient call and documentation of interaction: 11 minutes.  F/U Phone call planned: 3 weeks

## 2021-07-05 NOTE — Telephone Encounter (Signed)
Noted and agree. 

## 2021-07-13 ENCOUNTER — Telehealth (HOSPITAL_COMMUNITY): Payer: Medicare Other | Admitting: Psychiatry

## 2021-07-13 ENCOUNTER — Encounter (HOSPITAL_COMMUNITY): Payer: Self-pay

## 2021-07-16 ENCOUNTER — Encounter: Payer: Medicare Other | Admitting: Neurology

## 2021-07-17 ENCOUNTER — Encounter: Payer: Self-pay | Admitting: *Deleted

## 2021-07-27 ENCOUNTER — Telehealth: Payer: Self-pay | Admitting: Pharmacist

## 2021-07-27 DIAGNOSIS — F17200 Nicotine dependence, unspecified, uncomplicated: Secondary | ICD-10-CM

## 2021-07-27 NOTE — Assessment & Plan Note (Signed)
Patient contacted for follow/up of tobacco cessation - QUIT for > 1 month!  Since last contact patient reports no slips or relapses. Medications currently being used; None.  Rates CONFIDENCE of staying  tobacco free as very high.   Most common triggers to crave tobacco include; being around stress at work including her boss who is a smoker.   Motivation to quit: health and breathing  Patient had quit briefly in late 2022 for about 1-2 months.  She reported that she believes she has "quit for good" this time.  We briefly discussed how significant life stress will still create cravings for cigarettes in the future and strategies on how to maintain abstinence long-term.

## 2021-07-27 NOTE — Telephone Encounter (Signed)
-----   Message from Leavy Cella, [place] sent at 07/05/2021 12:06 PM EDT ----- Regarding: Quit for ~ 1 month or 5 weeks????   Was quit for two weeks and off Tx 5/25

## 2021-07-27 NOTE — Telephone Encounter (Signed)
Noted and agree. 

## 2021-07-27 NOTE — Telephone Encounter (Signed)
Patient contacted for follow/up of tobacco cessation - QUIT for > 1 month!  Since last contact patient reports no slips or relapses. Medications currently being used; None.  Rates CONFIDENCE of staying  tobacco free as very high.   Most common triggers to crave tobacco include; being around stress at work including her boss who is a smoker.   Motivation to quit: health and breathing  Patient had quit briefly in late 2022 for about 1-2 months.  She reported that she believes she has "quit for good" this time.  We briefly discussed how significant life stress will still create cravings for cigarettes in the future and strategies on how to maintain abstinence long-term.   Total time with patient call and documentation of interaction: 17 minutes.  F/U Phone call planned: 3-4 months.

## 2021-08-06 DIAGNOSIS — G894 Chronic pain syndrome: Secondary | ICD-10-CM | POA: Diagnosis not present

## 2021-08-06 DIAGNOSIS — M5136 Other intervertebral disc degeneration, lumbar region: Secondary | ICD-10-CM | POA: Diagnosis not present

## 2021-08-06 DIAGNOSIS — M5416 Radiculopathy, lumbar region: Secondary | ICD-10-CM | POA: Diagnosis not present

## 2021-08-06 DIAGNOSIS — M961 Postlaminectomy syndrome, not elsewhere classified: Secondary | ICD-10-CM | POA: Diagnosis not present

## 2021-08-07 ENCOUNTER — Other Ambulatory Visit: Payer: Self-pay | Admitting: Family Medicine

## 2021-09-03 DIAGNOSIS — M25551 Pain in right hip: Secondary | ICD-10-CM | POA: Diagnosis not present

## 2021-09-05 DIAGNOSIS — M25511 Pain in right shoulder: Secondary | ICD-10-CM | POA: Diagnosis not present

## 2021-09-18 ENCOUNTER — Other Ambulatory Visit: Payer: Self-pay | Admitting: Family Medicine

## 2021-09-20 MED ORDER — DULOXETINE HCL 30 MG PO CPEP: 30.0000 mg | ORAL_CAPSULE | Freq: Every day | ORAL | 0 refills | Status: DC

## 2021-09-20 NOTE — Telephone Encounter (Signed)
Patient calls nurse line regarding prescription refill for Duloxetine.   Advised that medication refill was denied as medication had been discontinued.   Patient reports that she has not stopped taking this medication and she has been taking 1 capsule daily.   Will forward to PCP for further clarification.   Talbot Grumbling, RN

## 2021-09-20 NOTE — Telephone Encounter (Signed)
Called patient, reports that she would like to restart duloxetine.  She has not been taking it for months, she is unsure if her psychiatrist had discontinued the medication.  She did not feel she had a therapeutic relationship with her psychiatrist.  She believes her last appointment with psychiatrist was 1 month ago, states does not have another appointment scheduled currently.  Reviewed last psychiatry note from April 2023, there are some discrepancy as note states continue duloxetine but medication was discontinued.  Previously had been on duloxetine 60 mg, will restart duloxetine at lower dose 30 mg per patient request.  She has follow-up with Dr. Gwendolyn Lima already scheduled later this month.  I would encourage her to continue follow-up with psychiatry if amenable given the numerous psychotropic medications she is on.  She reports she has continued to remain smoke-free.

## 2021-09-20 NOTE — Addendum Note (Signed)
Addended by: Zola Button D on: 09/20/2021 07:49 PM   Modules accepted: Orders

## 2021-10-01 ENCOUNTER — Encounter: Payer: Self-pay | Admitting: Family Medicine

## 2021-10-01 ENCOUNTER — Ambulatory Visit (INDEPENDENT_AMBULATORY_CARE_PROVIDER_SITE_OTHER): Payer: Medicare Other | Admitting: Family Medicine

## 2021-10-01 VITALS — BP 118/68 | HR 78 | Wt 233.0 lb

## 2021-10-01 DIAGNOSIS — J4520 Mild intermittent asthma, uncomplicated: Secondary | ICD-10-CM

## 2021-10-01 DIAGNOSIS — G479 Sleep disorder, unspecified: Secondary | ICD-10-CM | POA: Diagnosis not present

## 2021-10-01 NOTE — Patient Instructions (Signed)
It was great to see you today! Thank you for choosing [facility] for your primary care. Shelby [name] was seen for medication change.  Today we addressed: Sleep problems and issues with albuterol inhaler. I will be referring you to Pulmonology as I wonder if your asthma is more complicated or your diagnosis may be different. They will also be able to evaluate you for a sleep study and discuss sleep apnea with you. You are also on home oxygen and should be following with a pulmonologist for this.   If you haven't already, sign up for My Chart to have easy access to your labs results, and communication with your primary care physician.  We are checking some labs today. If they are abnormal, I will call you. If they are normal, I will send you a MyChart message (if it is active) or a letter in the mail. If you do not hear about your labs in the next 2 weeks, please call the office.   You should return to our clinic Return if symptoms worsen or fail to improve.  I recommend that you always bring your medications to each appointment as this makes it easy to ensure you are on the correct medications and helps [place] not miss refills when you need them.  Please arrive 15 minutes before your appointment to ensure smooth check in process.  We appreciate your efforts in making this happen.  Please call the clinic at [phone] if your symptoms worsen or you have any concerns.  Thank you for allowing me to participate in your care, Lowry Ram, MD 10/01/2021, 4:54 PM PGY-1, [place]

## 2021-10-01 NOTE — Assessment & Plan Note (Addendum)
Patient finds it difficult to fall asleep and stay asleep. Has snoring. Has previous sleep study about a decade ago without history in the system. On oxygen at night.  - Refer to pulmonology to discuss sleep study given risk for sleep apnea and to discuss need for oxygen overnight. - Given education regarding sleep hygiene and nighttime routine to help with falling asleep.

## 2021-10-01 NOTE — Assessment & Plan Note (Signed)
Uncontrolled as patient requires use of albuterol inhaler multiple times throughout the day and feels it's not working. Possibly misdiagnosis or combination of asthma and COPD given tobacco use risk.  - Refer to pulmonology to evaluate for asthma vs COPD

## 2021-10-01 NOTE — Progress Notes (Signed)
    SUBJECTIVE:   CHIEF COMPLAINT / HPI:   Sleep  Having issues staying asleep. Takes a long time to fall asleep. Sometimes wakes up to go to the bathroom. She has trouble falling back asleep. She mentions snoring at night. Uses oxygen at night starting 3-4 years ago after she had a sleep study. She cant remember going to sleep and feeling refreshed. Mom has narcolepsy. Dad has sleep apnea. Tries to take naps but it is hard for her to fall asleep.   Issues with inhalers  In the summertime has to use her inhaler daily because it gets very hot at her work. Does not use any steroid inhalers. Does not feel like it works during the winter time either. She feels like albuterol has helped her in the past but this particular inhaler does not help.   Tobacco Use Has been more stressed. She has smoked 4 packs a week. Restarted a month and half ago. Her stress is mainly marital but she feels safe at home. Her family is her support.   PERTINENT  PMH / PSH: Asthma, Migraines, S/p total hysterectomy, tobacco use   OBJECTIVE:   BP 118/68   Pulse 78   Wt 233 lb (105.7 kg)   LMP 05/12/2016   SpO2 98%   BMI 35.43 kg/m   General: well appearing middle aged woman  CV: RRR, slightly difficult to auscultate secondary to habitus, no murmurs rubs or gallops  Resp: CTAB, no increased work of breathing, no wheezes  Abd: soft, non tender to palpation, non distended   ASSESSMENT/PLAN:   Asthma Uncontrolled as patient requires use of albuterol inhaler multiple times throughout the day and feels it's not working. Possibly misdiagnosis or combination of asthma and COPD given tobacco use risk.  - Refer to pulmonology to evaluate for asthma vs COPD   Sleep disturbances Patient finds it difficult to fall asleep and stay asleep. Has snoring. Has previous sleep study about a decade ago without history in the system. On oxygen at night.  - Refer to pulmonology to discuss sleep study given risk for sleep apnea and  to discuss need for oxygen overnight. - Given education regarding sleep hygiene and nighttime routine to help with falling asleep.      Lowry Ram, MD [place]

## 2021-10-04 ENCOUNTER — Ambulatory Visit (INDEPENDENT_AMBULATORY_CARE_PROVIDER_SITE_OTHER): Payer: Medicare Other | Admitting: Pulmonary Disease

## 2021-10-04 ENCOUNTER — Encounter: Payer: Self-pay | Admitting: Pulmonary Disease

## 2021-10-04 VITALS — BP 130/82 | HR 84 | Temp 98.4°F | Ht 68.0 in | Wt 234.8 lb

## 2021-10-04 DIAGNOSIS — G4734 Idiopathic sleep related nonobstructive alveolar hypoventilation: Secondary | ICD-10-CM

## 2021-10-04 DIAGNOSIS — J432 Centrilobular emphysema: Secondary | ICD-10-CM

## 2021-10-04 DIAGNOSIS — R0683 Snoring: Secondary | ICD-10-CM

## 2021-10-04 DIAGNOSIS — F17200 Nicotine dependence, unspecified, uncomplicated: Secondary | ICD-10-CM

## 2021-10-04 NOTE — Assessment & Plan Note (Signed)
This seems to be the main issue.  Smoking cessation was discussed as the most important intervention that would add years to her life.  Unfortunately she cannot use nicotine gum since she does not have teeth, reports skin reaction to nicotine patch and did not tolerate varenicline Due to nightmares
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
## 2021-10-04 NOTE — Assessment & Plan Note (Addendum)
Although she reports diagnosis of asthma for 20 years, poor response to albuterol seems to indicate more COPD than asthma.  CT chest shows mild emphysema and previous PFTs showed decreased DLCO which would be consistent with emphysema. We will repeat PFTs to assess for worsening due to her ongoing smoking She has previously tried [place] without benefit -which again indicates that asthma may not be the real issue here Meanwhile I will start her on LAMA/LABA combination such as Anoro, sample was provided.  She will call back if this helps.  Hopefully this will help decrease frequent use of albuterol MDI  If this is not helpful, we will provide her with a sample of triple therapy suggestive Breztri or Trelegy

## 2021-10-04 NOTE — Assessment & Plan Note (Signed)
We will check nocturnal oximetry on room air and reassess need for oxygen

## 2021-10-04 NOTE — Patient Instructions (Signed)
  X schedule pFTs  X Trial of Anoro -  1puff daily  X Repeat ONO on RA - [employer]

## 2021-10-12 ENCOUNTER — Institutional Professional Consult (permissible substitution): Payer: Medicare Other | Admitting: Pulmonary Disease

## 2021-10-24 DIAGNOSIS — M25551 Pain in right hip: Secondary | ICD-10-CM | POA: Diagnosis not present

## 2021-10-25 ENCOUNTER — Telehealth: Payer: Self-pay | Admitting: Pulmonary Disease

## 2021-10-26 NOTE — Telephone Encounter (Signed)
ONO/ RA shows persistent desat about 5h of the night Continue on oxygen please

## 2021-10-26 NOTE — Telephone Encounter (Signed)
I called the patient and gave her the results and she voices understanding.Nothing further needed.

## 2021-11-02 DIAGNOSIS — M7061 Trochanteric bursitis, right hip: Secondary | ICD-10-CM | POA: Insufficient documentation

## 2021-11-04 ENCOUNTER — Other Ambulatory Visit: Payer: Self-pay | Admitting: Family Medicine

## 2021-11-05 DIAGNOSIS — M7061 Trochanteric bursitis, right hip: Secondary | ICD-10-CM | POA: Diagnosis not present

## 2021-11-05 DIAGNOSIS — M25551 Pain in right hip: Secondary | ICD-10-CM | POA: Diagnosis not present

## 2021-11-06 ENCOUNTER — Other Ambulatory Visit (HOSPITAL_COMMUNITY): Payer: Self-pay

## 2021-11-06 DIAGNOSIS — M5416 Radiculopathy, lumbar region: Secondary | ICD-10-CM | POA: Diagnosis not present

## 2021-11-06 DIAGNOSIS — Z5181 Encounter for therapeutic drug level monitoring: Secondary | ICD-10-CM | POA: Diagnosis not present

## 2021-11-06 DIAGNOSIS — M5136 Other intervertebral disc degeneration, lumbar region: Secondary | ICD-10-CM | POA: Diagnosis not present

## 2021-11-06 DIAGNOSIS — Z79899 Other long term (current) drug therapy: Secondary | ICD-10-CM | POA: Diagnosis not present

## 2021-11-06 DIAGNOSIS — G894 Chronic pain syndrome: Secondary | ICD-10-CM | POA: Diagnosis not present

## 2021-11-16 ENCOUNTER — Ambulatory Visit
Admission: EM | Admit: 2021-11-16 | Discharge: 2021-11-16 | Disposition: A | Discharge status: Home / Self Care | Payer: Medicare Other

## 2021-11-16 DIAGNOSIS — T148XXA Other injury of unspecified body region, initial encounter: Secondary | ICD-10-CM | POA: Diagnosis not present

## 2021-11-16 NOTE — ED Triage Notes (Signed)
Pt presents with bruises, swelling  pain in right arm since 11/10/21 after plasma donation. Tylenol gives some relief.
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
## 2021-11-16 NOTE — Discharge Instructions (Signed)
Elevate the arm at rest, apply warm or cold compresses, whichever feels more relieving, take Tylenol as needed for pain.  This should resolve on its own over time

## 2021-11-28 ENCOUNTER — Telehealth: Payer: Self-pay | Admitting: Pharmacist

## 2021-11-28 DIAGNOSIS — F17200 Nicotine dependence, unspecified, uncomplicated: Secondary | ICD-10-CM

## 2021-11-28 NOTE — Telephone Encounter (Signed)
Noted and agree. 

## 2021-11-28 NOTE — Telephone Encounter (Signed)
Patient contacted for follow/up of tobacco essation attempt.   Since last contact patient reports that she quit for about 2 months then relapsed about 1 month ago.  She reports smoking about 1/2 ppd currently  Medications currently being used; None.   Continues to rate CONFIDENCE of quitting tobacco as high.   Most common triggers to use tobacco include; high levels of stress, which is resolving currently.  She hopes to quit again in the next few weeks.    Motivation to quit: health  Total time with patient call and documentation of interaction: 11 minutes. Follow-up phone call planned: 1 month.

## 2021-11-28 NOTE — Assessment & Plan Note (Signed)
Patient contacted for follow/up of tobacco essation attempt.   Since last contact patient reports that she quit for about 2 months then relapsed about 1 month ago.  She reports smoking about 1/2 ppd currently  Medications currently being used; None.   Continues to rate CONFIDENCE of quitting tobacco as high.   Most common triggers to use tobacco include; high levels of stress, which is resolving currently.  She hopes to quit again in the next few weeks.

## 2021-11-28 NOTE — Telephone Encounter (Signed)
-----   Message from Leavy Cella, [place] sent at 07/27/2021 10:44 AM EDT ----- Regarding: Tobacco Cessation - QUIT early May 2023 - 4 month follow-up call planned

## 2021-12-03 ENCOUNTER — Ambulatory Visit: Payer: Medicare Other | Admitting: Family Medicine

## 2021-12-04 DIAGNOSIS — M545 Low back pain, unspecified: Secondary | ICD-10-CM | POA: Diagnosis not present

## 2021-12-04 DIAGNOSIS — M5451 Vertebrogenic low back pain: Secondary | ICD-10-CM | POA: Diagnosis not present

## 2021-12-10 ENCOUNTER — Ambulatory Visit (HOSPITAL_COMMUNITY): Payer: Self-pay | Admitting: Orthopedic Surgery

## 2021-12-10 MED ORDER — VANCOMYCIN HCL 1500 MG/300ML IV SOLN
1500.0000 mg | INTRAVENOUS | Status: DC
  Administered 2021-12-24: 1500 mg via INTRAVENOUS
  Filled 2021-12-10: qty 300

## 2021-12-11 ENCOUNTER — Ambulatory Visit: Payer: Medicare Other | Admitting: Family Medicine

## 2021-12-18 ENCOUNTER — Ambulatory Visit (INDEPENDENT_AMBULATORY_CARE_PROVIDER_SITE_OTHER): Payer: Medicare Other | Admitting: Pulmonary Disease

## 2021-12-18 DIAGNOSIS — J432 Centrilobular emphysema: Secondary | ICD-10-CM

## 2021-12-18 LAB — PULMONARY FUNCTION TEST
DL/VA % pred: 84 %
DL/VA: 3.67 ml/min/mmHg/L
DLCO cor % pred: 77 %
DLCO cor: 17.6 ml/min/mmHg
DLCO unc % pred: 77 %
DLCO unc: 17.6 ml/min/mmHg
FEF 25-75 Post: 1.44 L/sec
FEF 25-75 Pre: 1.49 L/sec
FEF2575-%Change-Post: -3 %
FEF2575-%Pred-Post: 47 %
FEF2575-%Pred-Pre: 49 %
FEV1-%Change-Post: -1 %
FEV1-%Pred-Post: 66 %
FEV1-%Pred-Pre: 67 %
FEV1-Post: 2.04 L
FEV1-Pre: 2.07 L
FEV1FVC-%Change-Post: 1 %
FEV1FVC-%Pred-Pre: 89 %
FEV6-%Change-Post: -2 %
FEV6-%Pred-Post: 74 %
FEV6-%Pred-Pre: 76 %
FEV6-Post: 2.78 L
FEV6-Pre: 2.85 L
FEV6FVC-%Pred-Post: 102 %
FEV6FVC-%Pred-Pre: 102 %
FVC-%Change-Post: -2 %
FVC-%Pred-Post: 72 %
FVC-%Pred-Pre: 74 %
FVC-Post: 2.78 L
FVC-Pre: 2.85 L
Post FEV1/FVC ratio: 73 %
Post FEV6/FVC ratio: 100 %
Pre FEV1/FVC ratio: 73 %
Pre FEV6/FVC Ratio: 100 %
RV % pred: 130 %
RV: 2.3 L
TLC % pred: 98 %
TLC: 5.21 L

## 2021-12-18 NOTE — Progress Notes (Signed)
PFT done today. 

## 2021-12-19 ENCOUNTER — Encounter (HOSPITAL_COMMUNITY): Payer: Self-pay | Admitting: Orthopedic Surgery

## 2021-12-19 NOTE — Progress Notes (Signed)
PCP - Dr Zola Button Cardiologist - n/a  Chest x-ray - n/a EKG - n/a Stress Test - n/a ECHO - 11/28/17 Cardiac Cath - n/a  ICD Pacemaker/Loop - n/a  Sleep Study -  Yes CPAP - uses   ERAS: Clear liquids til 7 am DOS.  STOP now taking any Aspirin (unless otherwise instructed by your surgeon), Aleve, Naproxen, Ibuprofen, Motrin, Advil, Goody's, BC's, all herbal medications, fish oil, and all vitamins.   Coronavirus Screening Do you have any of the following symptoms:  Cough yes/no: No Fever (>100.80F)  yes/no: No Runny nose yes/no: No Sore throat yes/no: No Difficulty breathing/shortness of breath  yes/no: No  Have you traveled in the last 14 days and where? yes/no: No  Patient verbalized understanding of instructions that were given via phone.

## 2021-12-23 NOTE — Anesthesia Preprocedure Evaluation (Signed)
Anesthesia Evaluation  Patient identified by MRN, date of birth, ID band Patient awake    Reviewed: Allergy & Precautions, NPO status , Patient's Chart, lab work & pertinent test results  History of Anesthesia Complications Negative for: history of anesthetic complications  Airway Mallampati: II  TM Distance: >3 FB Neck ROM: Full    Dental  (+) Edentulous Lower, Edentulous Upper   Pulmonary asthma , sleep apnea and Oxygen sleep apnea , COPD, Current Smoker and Patient abstained from smoking.   Pulmonary exam normal        Cardiovascular negative cardio ROS Normal cardiovascular exam     Neuro/Psych  Headaches  Anxiety Depression Bipolar Disorder   Back pain with SCS    GI/Hepatic Neg liver ROS,GERD  Medicated,,  Endo/Other  negative endocrine ROS    Renal/GU Renal InsufficiencyRenal disease  negative genitourinary   Musculoskeletal  (+) Arthritis ,    Abdominal   Peds  Hematology negative hematology ROS (+)   Anesthesia Other Findings Day of surgery medications reviewed with patient.  Reproductive/Obstetrics negative OB ROS                              Anesthesia Physical Anesthesia Plan  ASA: 3  Anesthesia Plan: General   Post-op Pain Management: Tylenol PO (pre-op)*   Induction: Intravenous  PONV Risk Score and Plan: 3 and Treatment may vary due to age or medical condition, Ondansetron, Dexamethasone, Midazolam and Scopolamine patch - Pre-op  Airway Management Planned: Oral ETT  Additional Equipment: None  Intra-op Plan:   Post-operative Plan: Extubation in OR  Informed Consent: I have reviewed the patients History and Physical, chart, labs and discussed the procedure including the risks, benefits and alternatives for the proposed anesthesia with the patient or authorized representative who has indicated his/her understanding and acceptance.     Dental advisory  given  Plan Discussed with: CRNA  Anesthesia Plan Comments:         Anesthesia Quick Evaluation

## 2021-12-24 ENCOUNTER — Encounter (HOSPITAL_COMMUNITY): Payer: Self-pay | Admitting: Orthopedic Surgery

## 2021-12-24 ENCOUNTER — Ambulatory Visit (HOSPITAL_COMMUNITY)
Admission: RE | Admit: 2021-12-24 | Discharge: 2021-12-24 | Disposition: A | Discharge status: Home / Self Care | Payer: Medicare Other | Attending: Orthopedic Surgery | Admitting: Orthopedic Surgery

## 2021-12-24 ENCOUNTER — Encounter (HOSPITAL_COMMUNITY)
Admission: RE | Disposition: A | Discharge status: Home / Self Care | Payer: Self-pay | Source: Home / Self Care | Attending: Orthopedic Surgery

## 2021-12-24 ENCOUNTER — Ambulatory Visit (HOSPITAL_COMMUNITY): Payer: Medicare Other | Admitting: Anesthesiology

## 2021-12-24 ENCOUNTER — Ambulatory Visit (HOSPITAL_BASED_OUTPATIENT_CLINIC_OR_DEPARTMENT_OTHER): Payer: Medicare Other | Admitting: Anesthesiology

## 2021-12-24 ENCOUNTER — Other Ambulatory Visit: Payer: Self-pay

## 2021-12-24 DIAGNOSIS — G4730 Sleep apnea, unspecified: Secondary | ICD-10-CM | POA: Insufficient documentation

## 2021-12-24 DIAGNOSIS — F17210 Nicotine dependence, cigarettes, uncomplicated: Secondary | ICD-10-CM | POA: Diagnosis not present

## 2021-12-24 DIAGNOSIS — M545 Low back pain, unspecified: Secondary | ICD-10-CM | POA: Diagnosis not present

## 2021-12-24 DIAGNOSIS — Z79899 Other long term (current) drug therapy: Secondary | ICD-10-CM | POA: Insufficient documentation

## 2021-12-24 DIAGNOSIS — F17200 Nicotine dependence, unspecified, uncomplicated: Secondary | ICD-10-CM | POA: Diagnosis not present

## 2021-12-24 DIAGNOSIS — G894 Chronic pain syndrome: Secondary | ICD-10-CM | POA: Insufficient documentation

## 2021-12-24 DIAGNOSIS — J449 Chronic obstructive pulmonary disease, unspecified: Secondary | ICD-10-CM

## 2021-12-24 DIAGNOSIS — F319 Bipolar disorder, unspecified: Secondary | ICD-10-CM | POA: Diagnosis not present

## 2021-12-24 DIAGNOSIS — K219 Gastro-esophageal reflux disease without esophagitis: Secondary | ICD-10-CM | POA: Diagnosis not present

## 2021-12-24 DIAGNOSIS — J45909 Unspecified asthma, uncomplicated: Secondary | ICD-10-CM | POA: Insufficient documentation

## 2021-12-24 DIAGNOSIS — G8929 Other chronic pain: Secondary | ICD-10-CM | POA: Diagnosis present

## 2021-12-24 DIAGNOSIS — N189 Chronic kidney disease, unspecified: Secondary | ICD-10-CM | POA: Insufficient documentation

## 2021-12-24 DIAGNOSIS — Z9989 Dependence on other enabling machines and devices: Secondary | ICD-10-CM | POA: Diagnosis not present

## 2021-12-24 DIAGNOSIS — M1990 Unspecified osteoarthritis, unspecified site: Secondary | ICD-10-CM | POA: Insufficient documentation

## 2021-12-24 DIAGNOSIS — F419 Anxiety disorder, unspecified: Secondary | ICD-10-CM | POA: Insufficient documentation

## 2021-12-24 DIAGNOSIS — G4733 Obstructive sleep apnea (adult) (pediatric): Secondary | ICD-10-CM | POA: Diagnosis not present

## 2021-12-24 HISTORY — PX: SPINAL CORD STIMULATOR BATTERY EXCHANGE: SHX6202

## 2021-12-24 HISTORY — DX: Personal history of other medical treatment: Z92.89

## 2021-12-24 HISTORY — DX: Dyspnea, unspecified: R06.00

## 2021-12-24 HISTORY — DX: Gastro-esophageal reflux disease without esophagitis: K21.9

## 2021-12-24 LAB — CBC
HCT: 41.2 % (ref 36.0–46.0)
Hemoglobin: 14.1 g/dL (ref 12.0–15.0)
MCH: 30.1 pg (ref 26.0–34.0)
MCHC: 34.2 g/dL (ref 30.0–36.0)
MCV: 87.8 fL (ref 80.0–100.0)
Platelets: 352 10*3/uL (ref 150–400)
RBC: 4.69 MIL/uL (ref 3.87–5.11)
RDW: 12.6 % (ref 11.5–15.5)
WBC: 11.9 10*3/uL — ABNORMAL HIGH (ref 4.0–10.5)
nRBC: 0 % (ref 0.0–0.2)

## 2021-12-24 SURGERY — SPINAL CORD STIMULATOR BATTERY EXCHANGE
Anesthesia: General | Laterality: Bilateral

## 2021-12-24 MED ORDER — LIDOCAINE 2% (20 MG/ML) 5 ML SYRINGE
INTRAMUSCULAR | Status: DC | PRN
  Administered 2021-12-24: 100 mg via INTRAVENOUS

## 2021-12-24 MED ORDER — SUGAMMADEX SODIUM 200 MG/2ML IV SOLN
INTRAVENOUS | Status: DC | PRN
  Administered 2021-12-24: 200 mg via INTRAVENOUS

## 2021-12-24 MED ORDER — ONDANSETRON HCL 4 MG PO TABS: 4.0000 mg | ORAL_TABLET | Freq: Three times a day (TID) | ORAL | 0 refills | Status: DC | PRN

## 2021-12-24 MED ORDER — BUPIVACAINE-EPINEPHRINE (PF) 0.25% -1:200000 IJ SOLN
INTRAMUSCULAR | Status: AC
  Filled 2021-12-24: qty 30

## 2021-12-24 MED ORDER — BUPIVACAINE LIPOSOME 1.3 % IJ SUSP
INTRAMUSCULAR | Status: DC | PRN
  Administered 2021-12-24: 20 mL

## 2021-12-24 MED ORDER — MIDAZOLAM HCL 2 MG/2ML IJ SOLN
INTRAMUSCULAR | Status: AC
  Filled 2021-12-24: qty 2

## 2021-12-24 MED ORDER — BUPIVACAINE LIPOSOME 1.3 % IJ SUSP: INTRAMUSCULAR | Status: DC | PRN

## 2021-12-24 MED ORDER — LIDOCAINE 2% (20 MG/ML) 5 ML SYRINGE
INTRAMUSCULAR | Status: AC
  Filled 2021-12-24: qty 5

## 2021-12-24 MED ORDER — BUPIVACAINE-EPINEPHRINE 0.25% -1:200000 IJ SOLN
INTRAMUSCULAR | Status: DC | PRN
  Administered 2021-12-24: 10 mL
  Administered 2021-12-24: 20 mL

## 2021-12-24 MED ORDER — ONDANSETRON HCL 4 MG/2ML IJ SOLN
INTRAMUSCULAR | Status: AC
  Filled 2021-12-24: qty 2

## 2021-12-24 MED ORDER — PROPOFOL 10 MG/ML IV BOLUS
INTRAVENOUS | Status: AC
  Filled 2021-12-24: qty 20

## 2021-12-24 MED ORDER — CHLORHEXIDINE GLUCONATE 0.12 % MT SOLN: 15.0000 mL | Freq: Once | OROMUCOSAL | Status: AC

## 2021-12-24 MED ORDER — OXYCODONE-ACETAMINOPHEN 10-325 MG PO TABS: 1.0000 | ORAL_TABLET | Freq: Four times a day (QID) | ORAL | 0 refills | Status: AC | PRN

## 2021-12-24 MED ORDER — HYDROMORPHONE HCL 1 MG/ML IJ SOLN: 0.2500 mg | INTRAMUSCULAR | Status: DC | PRN

## 2021-12-24 MED ORDER — METHOCARBAMOL 500 MG PO TABS: 500.0000 mg | ORAL_TABLET | Freq: Three times a day (TID) | ORAL | 0 refills | Status: AC | PRN

## 2021-12-24 MED ORDER — ALBUTEROL SULFATE (2.5 MG/3ML) 0.083% IN NEBU
INHALATION_SOLUTION | RESPIRATORY_TRACT | Status: AC
  Filled 2021-12-24: qty 3

## 2021-12-24 MED ORDER — ROCURONIUM BROMIDE 10 MG/ML (PF) SYRINGE
PREFILLED_SYRINGE | INTRAVENOUS | Status: AC
  Filled 2021-12-24: qty 10

## 2021-12-24 MED ORDER — AMISULPRIDE (ANTIEMETIC) 5 MG/2ML IV SOLN: 10.0000 mg | Freq: Once | INTRAVENOUS | Status: DC | PRN

## 2021-12-24 MED ORDER — CHLORHEXIDINE GLUCONATE 0.12 % MT SOLN
OROMUCOSAL | Status: AC
  Administered 2021-12-24: 15 mL via OROMUCOSAL
  Filled 2021-12-24: qty 15

## 2021-12-24 MED ORDER — LACTATED RINGERS IV SOLN: INTRAVENOUS | Status: DC

## 2021-12-24 MED ORDER — VANCOMYCIN HCL 1500 MG/300ML IV SOLN
1500.0000 mg | INTRAVENOUS | Status: DC
  Filled 2021-12-24: qty 300

## 2021-12-24 MED ORDER — BUPIVACAINE LIPOSOME 1.3 % IJ SUSP
INTRAMUSCULAR | Status: AC
  Filled 2021-12-24: qty 20

## 2021-12-24 MED ORDER — DEXAMETHASONE SODIUM PHOSPHATE 10 MG/ML IJ SOLN
INTRAMUSCULAR | Status: AC
  Filled 2021-12-24: qty 1

## 2021-12-24 MED ORDER — 0.9 % SODIUM CHLORIDE (POUR BTL) OPTIME
TOPICAL | Status: DC | PRN
  Administered 2021-12-24: 1000 mL

## 2021-12-24 MED ORDER — PROPOFOL 10 MG/ML IV BOLUS
INTRAVENOUS | Status: DC | PRN
  Administered 2021-12-24: 30 mg via INTRAVENOUS
  Administered 2021-12-24: 170 mg via INTRAVENOUS

## 2021-12-24 MED ORDER — FENTANYL CITRATE (PF) 250 MCG/5ML IJ SOLN
INTRAMUSCULAR | Status: DC | PRN
  Administered 2021-12-24: 50 ug via INTRAVENOUS

## 2021-12-24 MED ORDER — SCOPOLAMINE 1 MG/3DAYS TD PT72
1.0000 | MEDICATED_PATCH | Freq: Once | TRANSDERMAL | Status: DC
  Administered 2021-12-24: 1.5 mg via TRANSDERMAL
  Filled 2021-12-24: qty 1

## 2021-12-24 MED ORDER — ROCURONIUM BROMIDE 10 MG/ML (PF) SYRINGE
PREFILLED_SYRINGE | INTRAVENOUS | Status: DC | PRN
  Administered 2021-12-24: 60 mg via INTRAVENOUS

## 2021-12-24 MED ORDER — ORAL CARE MOUTH RINSE: 15.0000 mL | Freq: Once | OROMUCOSAL | Status: AC

## 2021-12-24 MED ORDER — ONDANSETRON HCL 4 MG/2ML IJ SOLN
INTRAMUSCULAR | Status: DC | PRN
  Administered 2021-12-24: 4 mg via INTRAVENOUS

## 2021-12-24 MED ORDER — ACETAMINOPHEN 500 MG PO TABS
1000.0000 mg | ORAL_TABLET | Freq: Once | ORAL | Status: AC
  Administered 2021-12-24: 1000 mg via ORAL
  Filled 2021-12-24: qty 2

## 2021-12-24 MED ORDER — DEXAMETHASONE SODIUM PHOSPHATE 10 MG/ML IJ SOLN
INTRAMUSCULAR | Status: DC | PRN
  Administered 2021-12-24: 10 mg via INTRAVENOUS

## 2021-12-24 MED ORDER — ALBUTEROL SULFATE HFA 108 (90 BASE) MCG/ACT IN AERS
INHALATION_SPRAY | RESPIRATORY_TRACT | Status: DC | PRN
  Administered 2021-12-24: 2 via RESPIRATORY_TRACT

## 2021-12-24 MED ORDER — FENTANYL CITRATE (PF) 250 MCG/5ML IJ SOLN
INTRAMUSCULAR | Status: AC
  Filled 2021-12-24: qty 5

## 2021-12-24 SURGICAL SUPPLY — 50 items
BAG COUNTER SPONGE SURGICOUNT (BAG) ×1 IMPLANT
CABLE MULTI-LEAD TRIAL SPINE (MISCELLANEOUS) IMPLANT
CANISTER SUCT 3000ML PPV (MISCELLANEOUS) ×1 IMPLANT
CLSR STERI-STRIP ANTIMIC 1/2X4 (GAUZE/BANDAGES/DRESSINGS) ×1 IMPLANT
CONTROLLER NEUROSTIM PATIENT (NEUROSURGERY SUPPLIES) IMPLANT
CORD BIPOLAR FORCEPS 12FT (ELECTRODE) ×1 IMPLANT
DRAIN CHANNEL 15F RND FF W/TCR (WOUND CARE) IMPLANT
DRAPE INCISE IOBAN 66X45 STRL (DRAPES) ×1 IMPLANT
DRAPE LAPAROTOMY 100X72 PEDS (DRAPES) ×1 IMPLANT
DRAPE POUCH INSTRU U-SHP 10X18 (DRAPES) ×1 IMPLANT
DRAPE SURG 17X23 STRL (DRAPES) ×1 IMPLANT
DRAPE U-SHAPE 47X51 STRL (DRAPES) ×1 IMPLANT
DRSG AQUACEL AG ADV 3.5X 6 (GAUZE/BANDAGES/DRESSINGS) ×1 IMPLANT
DRSG OPSITE POSTOP 4X6 (GAUZE/BANDAGES/DRESSINGS) ×1 IMPLANT
DURAPREP 26ML APPLICATOR (WOUND CARE) ×1 IMPLANT
ELECT CAUTERY BLADE 6.4 (BLADE) ×1 IMPLANT
ELECT PENCIL ROCKER SW 15FT (MISCELLANEOUS) ×1 IMPLANT
ELECT REM PT RETURN 9FT ADLT (ELECTROSURGICAL) ×1
ELECTRODE REM PT RTRN 9FT ADLT (ELECTROSURGICAL) ×1 IMPLANT
GENERATOR PULSE PROCLAIM 5ELIT (Neuro Prosthesis/Implant) IMPLANT
GLOVE BIO SURGEON STRL SZ 6.5 (GLOVE) ×1 IMPLANT
GLOVE BIOGEL PI IND STRL 6.5 (GLOVE) ×1 IMPLANT
GLOVE BIOGEL PI IND STRL 8.5 (GLOVE) ×1 IMPLANT
GLOVE SS BIOGEL STRL SZ 8.5 (GLOVE) ×2 IMPLANT
GOWN STRL REUS W/ TWL LRG LVL3 (GOWN DISPOSABLE) ×2 IMPLANT
GOWN STRL REUS W/TWL 2XL LVL3 (GOWN DISPOSABLE) ×1 IMPLANT
GOWN STRL REUS W/TWL LRG LVL3 (GOWN DISPOSABLE) ×2
KIT BASIN OR (CUSTOM PROCEDURE TRAY) ×1 IMPLANT
KIT TURNOVER KIT B (KITS) ×1 IMPLANT
NDL 22X1.5 STRL (OR ONLY) (MISCELLANEOUS) IMPLANT
NDL SUT 6 .5 CRC .975X.05 MAYO (NEEDLE) ×1 IMPLANT
NEEDLE 22X1.5 STRL (OR ONLY) (MISCELLANEOUS) IMPLANT
NEEDLE MAYO TAPER (NEEDLE) ×1
NS IRRIG 1000ML POUR BTL (IV SOLUTION) ×1 IMPLANT
PACK GENERAL/GYN (CUSTOM PROCEDURE TRAY) ×1 IMPLANT
PACK UNIVERSAL I (CUSTOM PROCEDURE TRAY) ×1 IMPLANT
PAD ARMBOARD 7.5X6 YLW CONV (MISCELLANEOUS) ×2 IMPLANT
PULSE GENERATOR PROCLAIM 5ELIT (Neuro Prosthesis/Implant) ×1 IMPLANT
SPONGE T-LAP 4X18 ~~LOC~~+RFID (SPONGE) ×1 IMPLANT
STAPLER VISISTAT 35W (STAPLE) IMPLANT
SUT ETHIBOND NAB CT1 #1 30IN (SUTURE) ×2 IMPLANT
SUT MNCRL AB 3-0 PS2 27 (SUTURE) ×2 IMPLANT
SUT VIC AB 1 CT1 18XCR BRD 8 (SUTURE) ×1 IMPLANT
SUT VIC AB 1 CT1 8-18 (SUTURE) ×1
SUT VIC AB 2-0 CT1 18 (SUTURE) ×2 IMPLANT
SYR BULB IRRIG 60ML STRL (SYRINGE) ×1 IMPLANT
SYR CONTROL 10ML LL (SYRINGE) IMPLANT
TOWEL GREEN STERILE (TOWEL DISPOSABLE) ×1 IMPLANT
TOWEL GREEN STERILE FF (TOWEL DISPOSABLE) ×2 IMPLANT
WATER STERILE IRR 1000ML POUR (IV SOLUTION) ×1 IMPLANT

## 2021-12-24 NOTE — Anesthesia Procedure Notes (Signed)
Procedure Name: Intubation Date/Time: 12/24/2021 11:07 AM  Performed by: Michele Rockers, CRNAPre-anesthesia Checklist: Patient identified, Patient being monitored, Timeout performed, Emergency Drugs available and Suction available Patient Re-evaluated:Patient Re-evaluated prior to induction Oxygen Delivery Method: Circle System Utilized Preoxygenation: Pre-oxygenation with 100% oxygen Induction Type: IV induction Ventilation: Mask ventilation without difficulty Laryngoscope Size: Miller and 2 Grade View: Grade I Tube type: Oral Tube size: 7.0 mm Number of attempts: 1 Airway Equipment and Method: Stylet Placement Confirmation: ETT inserted through vocal cords under direct vision, positive ETCO2 and breath sounds checked- equal and bilateral Secured at: 21 cm Tube secured with: Tape Dental Injury: Teeth and Oropharynx as per pre-operative assessment
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
## 2021-12-24 NOTE — Anesthesia Postprocedure Evaluation (Signed)
Anesthesia Post Note  Patient: Shelby [name]  Procedure(s) Performed: SPINAL CORD STIMULATOR BATTERY EXCHANGE (Bilateral)     Patient location during evaluation: PACU Anesthesia Type: General Level of consciousness: awake and alert Pain management: pain level controlled Vital Signs Assessment: post-procedure vital signs reviewed and stable Respiratory status: spontaneous breathing, nonlabored ventilation and respiratory function stable Cardiovascular status: blood pressure returned to baseline Postop Assessment: no apparent nausea or vomiting Anesthetic complications: no   No notable events documented.  Last Vitals:  Vitals:   12/24/21 1215 12/24/21 1230  BP: 118/86 130/71  Pulse: 63 (!) 54  Resp: 10 10  Temp: 36.6 C   SpO2: 97% 96%    Last Pain:  Vitals:   12/24/21 1215  TempSrc:   PainSc: [place]

## 2021-12-24 NOTE — Transfer of Care (Signed)
Immediate Anesthesia Transfer of Care Note  Patient: Shelby [name]  Procedure(s) Performed: SPINAL CORD STIMULATOR BATTERY EXCHANGE (Bilateral)  Patient Location: PACU  Anesthesia Type:General  Level of Consciousness: awake, alert , and oriented  Airway & Oxygen Therapy: Patient Spontanous Breathing and Patient connected to face mask oxygen  Post-op Assessment: Report given to RN and Post -op Vital signs reviewed and stable  Post vital signs: Reviewed and stable  Last Vitals:  Vitals Value Taken Time  BP 142/85 12/24/21 1200  Temp    Pulse 77 12/24/21 1202  Resp 19 12/24/21 1202  SpO2 98 % 12/24/21 1202  Vitals shown include unvalidated device data.  Last Pain:  Vitals:   12/24/21 0758  TempSrc:   PainSc: 1          Complications: No notable events documented.

## 2021-12-24 NOTE — Brief Op Note (Signed)
12/24/2021  11:52 AM  PATIENT:  Shelby [name]  46 y.o. female  PRE-OPERATIVE DIAGNOSIS:  chronic low back pain  POST-OPERATIVE DIAGNOSIS:  chronic low back pain  PROCEDURE:  Procedure(s) with comments: SPINAL CORD STIMULATOR BATTERY EXCHANGE (Bilateral) - 60  SURGEON:  Surgeon(s) and Role:    Melina Schools, MD - Primary  PHYSICIAN ASSISTANT:   ASSISTANTS: none   ANESTHESIA:   general  EBL:  5 mL   BLOOD ADMINISTERED:none  DRAINS: none   LOCAL MEDICATIONS USED:  MARCAINE    and OTHER exparel  SPECIMEN:  No Specimen  DISPOSITION OF SPECIMEN:  N/A  COUNTS:  YES  TOURNIQUET:  * No tourniquets in log *  DICTATION: .Dragon Dictation  PLAN OF CARE: Discharge to home after PACU  PATIENT DISPOSITION:  PACU - hemodynamically stable.

## 2021-12-24 NOTE — Op Note (Signed)
OPERATIVE REPORT  DATE OF SURGERY: 12/24/2021  PATIENT NAME:  Shelby [name] MRN: [id number] DOB: 1975-04-23  PCP: Zola Button, MD  PRE-OPERATIVE DIAGNOSIS: Chronic pain syndrome with prior spinal cord stimulator placement.  POST-OPERATIVE DIAGNOSIS: Same  PROCEDURE:   Replacement of spinal cord stimulator battery  SURGEON:  Melina Schools, MD  PHYSICIAN ASSISTANT: None  ANESTHESIA:   General  EBL: 5 ml   Complications: None  Implants: Proclaim XR also spinal cord stimulator battery from [employer]  BRIEF HISTORY: Shelby [name] is a 46 y.o. female who has had chronic pain for some time now.  She had a spinal cord stimulator in that went to end-of-life and it was removed in May.  At that time the patient did not feel as though it was still helpful and so she elected not to have a replacement.  Since the time of the battery removal she has been having progressively worsening pain and presents today to have a battery placed.  All appropriate risks benefits and alternatives were discussed with the patient and consent was obtained.  PROCEDURE DETAILS: Patient was brought into the operating room and was properly positioned on the operating room table.  After induction with general anesthesia the patient was endotracheally intubated.  A timeout was taken to confirm all important data: including patient, procedure, and the level. Teds, SCD's were applied.   The prior battery incision site was infiltrated with quarter percent Marcaine with epinephrine and reincised.  Sharp dissection was carried out down to the deep subcutaneous tissue and then identified the prior leads.  The leads were then brought out of the battery site and then directly tested.  Once they were noted to be functioning the battery was opened.  The leads were then secured into the battery and tightened according manufacture standards.  The battery was then placed back into the pocket and held in place with two #1  Ethibond sutures.  I irrigated wound copiously normal saline.  I then tested the battery and it was functioning.  I then closed the deep fascia with interrupted #1 Vicryl sutures.  30 cc of quarter percent Marcaine with Exparel were then injected in and around the wound to provide postoperative analgesia.  Superficial tissue was closed with interrupted 2-0 Vicryl suture and finally a 3-0 Monocryl for the skin.  Steri-Strips and dry dressings were applied and the patient was ultimately extubated transfer the PACU without incident.  The end of the case all needle and sponge counts were correct.  Melina Schools, MD 12/24/2021 11:46 AM

## 2021-12-24 NOTE — Discharge Instructions (Signed)
Spinal Cord Stimulator Replacement Care After This sheet gives you information about how to care for yourself after your procedure. Your health care provider may also give you more specific instructions. If you have problems or questions, contact your health care provider. What can I expect after the procedure? After the procedure, it is common to have: Soreness or pain. Some swelling in the area where the hardware was removed. A small amount of blood or clear fluid coming from your incision. Follow these instructions at home: If you have a cast: Do not stick anything inside the cast to scratch your skin. Doing that increases your risk of infection. Check the skin around the cast every day. Tell your health care provider about any concerns. You may put lotion on dry skin around the edges of the cast. Do not put lotion on the skin underneath the cast. Keep the cast clean and dry. Do not take baths, swim, or use a hot tub until your health care provider approves. Ask your health care provider if you may take showers. You may only be allowed to take sponge baths. Keep the bandage (dressing) dry until your health care provider says it can be removed. Ok to shower in 5 days.    Incision care  Follow instructions from your health care provider about how to take care of your incision. Make sure you: Wash your hands with soap and water before you change your dressing. If soap and water are not available, use hand sanitizer. Change your dressing as told by your health care provider. Leave stitches (sutures), skin glue, or adhesive strips in place. These skin closures may need to stay in place for 2 weeks or longer. If adhesive strip edges start to loosen and curl up, you may trim the loose edges. Do not remove adhesive strips completely unless your health care provider tells you to do that. Check your incision area every day for signs of infection. Check for: Redness. More swelling or pain. More  fluid or blood. Warmth. Pus or a bad smell. Managing pain, stiffness, and swelling  If directed, put ice on the affected area: Put ice in a plastic bag. Place a towel between your skin and the bag. Leave the ice on for 20 minutes, 2-3 times a day. Move your fingers or toes often to avoid stiffness and to lessen swelling.  Driving Do not drive or use heavy machinery while taking prescription pain medicine. Do not drive for 24 hours if you were given a medicine to help you relax (sedative) during your procedure. Ask your health care provider when it is safe to drive if you have a cast, splint, or boot on the affected limb. Activity Ask your health care provider what activities are safe for you during recovery, and ask what activities you need to avoid. Do not use the injured limb to support your body weight until your health care provider says that you can. Do not play contact sports until your health care provider approves. Do exercises as told by your health care provider. Avoid sitting for a long time without moving. Get up and move around at least every few hours. This will help prevent blood clots. General instructions Do not put pressure on any part of the cast or splint until it is fully hardened. This may take several hours. If you are taking prescription pain medicine, take actions to prevent or treat constipation. Your health care provider may recommend that you: Drink enough fluid to keep your  urine pale yellow. Eat foods that are high in fiber, such as fresh fruits and vegetables, whole grains, and beans. Limit foods that are high in fat and processed sugars, such as fried or sweet foods. Take an over-the-counter or prescription medicine for constipation. Do not use any products that contain nicotine or tobacco, such as cigarettes and e-cigarettes. These can delay bone healing after surgery. If you need help quitting, ask your health care provider. Take over-the-counter and  prescription medicines only as told by your health care provider. Keep all follow-up visits as told by your health care provider. This is important. Contact a health care provider if: You have lasting pain. You have redness around your incision. You have more swelling or pain around your incision. You have more fluid or blood coming from your incision. Your incision feels warm to the touch. You have pus or a bad smell coming from your incision. You are unable to do exercises or physical activity as told by your health care provider. Get help right away if: You have difficulty breathing. You have chest pain. You have severe pain. You have a fever or chills. You have numbness for more than 24 hours in the area where the hardware was removed. Summary After the procedure, it is common to have some pain and swelling in the area where the hardware was removed. Follow instructions from your health care provider about how to take care of your incision. Return to your normal activities as told by your health care provider. Ask your health care provider what activities are safe for you. This information is not intended to replace advice given to you by your health care provider. Make sure you discuss any questions you have with your health care provider.

## 2021-12-25 ENCOUNTER — Encounter (HOSPITAL_COMMUNITY): Payer: Self-pay | Admitting: Orthopedic Surgery

## 2022-01-07 ENCOUNTER — Telehealth: Payer: Self-pay | Admitting: Pharmacist

## 2022-01-07 DIAGNOSIS — F17200 Nicotine dependence, unspecified, uncomplicated: Secondary | ICD-10-CM

## 2022-01-07 NOTE — Assessment & Plan Note (Signed)
Patient contacted for follow/up of tobacco intake reduction / cessation attempt.   Since last contact patient reports having had surgery, which has is recovering well from.  She reports she is having her stiches out tomorrow.  She also reports smoking 1/2 to 3/4 of a pack of cigarettes per day for the last few weeks  Medications currently being used;  None.  Continues to rates IMPORTANCE of quitting tobacco as high.  Continues to rate CONFIDENCE of quitting tobacco as high.   Most common triggers to use tobacco include; stress.   Motivation to quit: health   Total time with patient call and documentation of interaction: 9 minutes. Follow-up phone call planned: early 2024 as she plans to quit completely by "the first of the year"

## 2022-01-07 NOTE — Telephone Encounter (Signed)
Patient contacted for follow/up of tobacco intake reduction / cessation attempt.   Since last contact patient reports having had surgery, which has is recovering well from.  She reports she is having her stiches out tomorrow.  She also reports smoking 1/2 to 3/4 of a pack of cigarettes per day for the last few weeks  Medications currently being used;  None.  Continues to rates IMPORTANCE of quitting tobacco as high.  Continues to rate CONFIDENCE of quitting tobacco as high.   Most common triggers to use tobacco include; stress.   Motivation to quit: health   Total time with patient call and documentation of interaction: 9 minutes. Follow-up phone call planned: early 2024 as she plans to quit completely by "the first of the year"

## 2022-01-07 NOTE — Telephone Encounter (Signed)
Noted and agree. 

## 2022-01-07 NOTE — Telephone Encounter (Signed)
-----   Message from Leavy Cella, [place] sent at 11/28/2021 11:55 AM EDT ----- Regarding: Follow-up of potential REQUIT date Patient had quit in the past few months but relapsed about 1 month prior due to stress.  Smoking 1/2 ppd at time of call 10/18

## 2022-01-09 ENCOUNTER — Encounter: Payer: Self-pay | Admitting: Pulmonary Disease

## 2022-01-09 NOTE — Telephone Encounter (Signed)
Patient is asking for more details on PFT results. Please advise.

## 2022-01-11 NOTE — Telephone Encounter (Signed)
Dr. Elsworth Soho please advise patient is asking if she can have a rescue inhaler?

## 2022-01-13 ENCOUNTER — Other Ambulatory Visit: Payer: Self-pay | Admitting: Family Medicine

## 2022-01-15 MED ORDER — ALBUTEROL SULFATE HFA 108 (90 BASE) MCG/ACT IN AERS: 2.0000 | INHALATION_SPRAY | RESPIRATORY_TRACT | 3 refills | Status: DC | PRN

## 2022-01-15 NOTE — Addendum Note (Signed)
Addended by: Fritzi Mandes D on: 01/15/2022 02:08 PM   Modules accepted: Orders

## 2022-01-21 ENCOUNTER — Encounter: Payer: Self-pay | Admitting: Pulmonary Disease

## 2022-01-21 ENCOUNTER — Telehealth: Payer: Self-pay | Admitting: Pulmonary Disease

## 2022-01-21 ENCOUNTER — Ambulatory Visit (INDEPENDENT_AMBULATORY_CARE_PROVIDER_SITE_OTHER): Payer: Medicare Other | Admitting: Pulmonary Disease

## 2022-01-21 VITALS — BP 132/78 | HR 80 | Temp 99.0°F | Ht 69.0 in | Wt 241.2 lb

## 2022-01-21 DIAGNOSIS — F17200 Nicotine dependence, unspecified, uncomplicated: Secondary | ICD-10-CM

## 2022-01-21 DIAGNOSIS — J4489 Other specified chronic obstructive pulmonary disease: Secondary | ICD-10-CM | POA: Diagnosis not present

## 2022-01-21 MED ORDER — NICOTINE 10 MG IN INHA: 1.0000 | RESPIRATORY_TRACT | 0 refills | Status: DC | PRN

## 2022-01-21 MED ORDER — NICOTINE 10 MG IN INHA: 1.0000 | RESPIRATORY_TRACT | Status: DC | PRN
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
## 2022-01-21 NOTE — Assessment & Plan Note (Signed)
Lung function has dropped 10% due to ongoing smoking. She is unable to tolerate Breo in the past.  I was able to convince her to try Anoro little longer and see if this would help with her breathing and decrease use of her rescue inhaler. We discussed COPD action plan and signs and symptoms of COPD exacerbation

## 2022-01-21 NOTE — Addendum Note (Signed)
Addended by: Irine Seal B on: 01/21/2022 09:48 AM   Modules accepted: Orders

## 2022-01-21 NOTE — Addendum Note (Signed)
Addended by: Irine Seal B on: 01/21/2022 09:47 AM   Modules accepted: Orders

## 2022-01-21 NOTE — Assessment & Plan Note (Signed)
Smoking cessation was again emphasized is the most important intervention that would add years to her life and prevent further drop in lung function. I have asked her to confirm that she is taking nicotine patch 21 mg/day. Prescription for Nicotrol inhaler will be sent in to use for breakthrough

## 2022-01-21 NOTE — Patient Instructions (Signed)
  Nicotine patch 21 mg/day  X Rx for nicotrol inhaler   Continue trying anoro once daily  Lung function is dropping

## 2022-01-21 NOTE — Telephone Encounter (Signed)
Called pt's pharmacy about pt's Rx and was told that they already had clarification taken care of for pt's Rx. Nothing further needed.

## 2022-01-23 ENCOUNTER — Other Ambulatory Visit: Payer: Self-pay

## 2022-02-18 ENCOUNTER — Telehealth: Payer: Self-pay | Admitting: Pharmacist

## 2022-02-18 NOTE — Telephone Encounter (Signed)
Patient contacted for follow/up of tobacco intake reduction / cessation attempt.   Patient was initially available but requested call back in 60 minutes.  Attempted call back X2 and both went to voice mail.  Left HIPAA compliant message.  NO assessment possible   Total time with patient call and documentation of interaction: 13 minutes. Follow-up phone call planned: 2-3 weeks

## 2022-02-18 NOTE — Telephone Encounter (Signed)
-----   Message from Leavy Cella, [place] sent at 01/07/2022  3:46 PM EST ----- Regarding: Tobacco Cessation ? Plans to quit by 02/11/2022

## 2022-02-22 DIAGNOSIS — M25511 Pain in right shoulder: Secondary | ICD-10-CM | POA: Diagnosis not present
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
## 2022-03-04 NOTE — Patient Instructions (Incomplete)
It was nice seeing you today!  Blood work today.  See me in 3 months or whenever is a good for you.  Stay well, Siham Bucaro, MD [facility] [phone]  --  Make sure to check out at the front desk before you leave today.  Please arrive at least 15 minutes prior to your scheduled appointments.  If you had blood work today, I will send you a MyChart message or a letter if results are normal. Otherwise, I will give you a call.  If you had a referral placed, they will call you to set up an appointment. Please give us a call if you don't hear back in the next 2 weeks.  If you need additional refills before your next appointment, please call your pharmacy first.  

## 2022-03-05 ENCOUNTER — Ambulatory Visit: Payer: Medicare Other | Admitting: Family Medicine

## 2022-03-11 DIAGNOSIS — M5416 Radiculopathy, lumbar region: Secondary | ICD-10-CM | POA: Diagnosis not present

## 2022-03-11 DIAGNOSIS — M961 Postlaminectomy syndrome, not elsewhere classified: Secondary | ICD-10-CM | POA: Diagnosis not present

## 2022-03-20 NOTE — Patient Instructions (Addendum)
It was nice seeing you today!  Take the pantoprazole (Protonix) once a day instead of twice a day.  Blood work today, then I will try to send in [place].  Try to gradually increase physical activity. Try to eat more smaller meals throughout the day and try to eat some protein.  If you're interested, you can schedule nutrition-focused visits with me or any of my colleagues.  [facility] [phone], [place], [place] [id number]   Stay well, Zola Button, MD [place] [phone]  --  Make sure to check out at the front desk before you leave today.  Please arrive at least 15 minutes prior to your scheduled appointments.  If you had blood work today, I will send you a MyChart message or a letter if results are normal. Otherwise, I will give you a call.  If you had a referral placed, they will call you to set up an appointment. Please give [place] a call if you don't hear back in the next 2 weeks.  If you need additional refills before your next appointment, please call your pharmacy first.
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
## 2022-03-22 ENCOUNTER — Ambulatory Visit (INDEPENDENT_AMBULATORY_CARE_PROVIDER_SITE_OTHER): Payer: Medicare Other | Admitting: Family Medicine

## 2022-03-22 ENCOUNTER — Encounter: Payer: Self-pay | Admitting: Family Medicine

## 2022-03-22 DIAGNOSIS — K219 Gastro-esophageal reflux disease without esophagitis: Secondary | ICD-10-CM | POA: Diagnosis not present

## 2022-03-22 DIAGNOSIS — E785 Hyperlipidemia, unspecified: Secondary | ICD-10-CM | POA: Insufficient documentation

## 2022-03-22 DIAGNOSIS — E8941 Symptomatic postprocedural ovarian failure: Secondary | ICD-10-CM

## 2022-03-22 DIAGNOSIS — R739 Hyperglycemia, unspecified: Secondary | ICD-10-CM

## 2022-03-22 DIAGNOSIS — E782 Mixed hyperlipidemia: Secondary | ICD-10-CM | POA: Diagnosis not present

## 2022-03-22 MED ORDER — PANTOPRAZOLE SODIUM 40 MG PO TBEC: 40.0000 mg | DELAYED_RELEASE_TABLET | Freq: Every day | ORAL | 3 refills | Status: DC

## 2022-03-22 MED ORDER — ALBUTEROL SULFATE HFA 108 (90 BASE) MCG/ACT IN AERS: 2.0000 | INHALATION_SPRAY | RESPIRATORY_TRACT | 3 refills | Status: DC | PRN

## 2022-03-22 NOTE — Assessment & Plan Note (Signed)
Check A1c and lipid panel.  Will attempt to send in GLP-1 agonist but advised that insurance may not cover this.  I think she would greatly benefit from GLP-1 agonist if able to be covered.  Healthy weight and wellness information provided, also offered nutrition focused visits in our clinic.

## 2022-03-22 NOTE — Assessment & Plan Note (Signed)
Advised to decrease PPI to once daily, if well-controlled can trial to decrease to as needed basis

## 2022-03-22 NOTE — Assessment & Plan Note (Signed)
Secondary to hysterectomy.  Still symptomatic, advised to discuss with her OB/GYN about dose adjustment of her estradiol.

## 2022-03-23 LAB — LIPID PANEL
Chol/HDL Ratio: 5.1 ratio — ABNORMAL HIGH (ref 0.0–4.4)
Cholesterol, Total: 188 mg/dL (ref 100–199)
HDL: 37 mg/dL — ABNORMAL LOW (ref >39)
LDL Chol Calc (NIH): 113 mg/dL — ABNORMAL HIGH (ref 0–99)
Triglycerides: 219 mg/dL — ABNORMAL HIGH (ref 0–149)
VLDL Cholesterol Cal: 38 mg/dL (ref 5–40)

## 2022-03-23 LAB — HEMOGLOBIN A1C
Est. average glucose Bld gHb Est-mCnc: 111 mg/dL
Hgb A1c MFr Bld: 5.5 % (ref 4.8–5.6)

## 2022-03-25 MED ORDER — SEMAGLUTIDE(0.25 OR 0.5MG/DOS) 2 MG/1.5ML ~~LOC~~ SOPN: 0.2500 mg | PEN_INJECTOR | SUBCUTANEOUS | 0 refills | Status: DC

## 2022-03-25 NOTE — Addendum Note (Signed)
Addended by: Zola Button D on: 03/25/2022 02:07 PM   Modules accepted: Orders

## 2022-03-28 ENCOUNTER — Telehealth: Payer: Self-pay | Admitting: Pharmacist

## 2022-03-28 NOTE — Telephone Encounter (Signed)
Patient contacted for follow/up of tobacco intake reduction / cessation attempt.   Since last contact patient reports her smoking has increased to 1.5 ppd.  She was out with her husband and could not talk in details.    I asked her to reschedule into an appointment in the future WHEN she was ready to take more action related to cutting back/quitting tobacco.    She thanked me for the call and stated she would schedule in the near future.   Total time with patient call and documentation of interaction: 11 minutes. Follow-up phone call planned: None at this time.  Patient needs in-person appointment.

## 2022-03-28 NOTE — Telephone Encounter (Signed)
-----   Message from Leavy Cella, [place] sent at 02/18/2022  3:29 PM EST ----- Regarding: Progress with tobacco intake reduction Did not answer call back on 02/18/2022

## 2022-04-04 ENCOUNTER — Other Ambulatory Visit (HOSPITAL_COMMUNITY): Payer: Self-pay

## 2022-04-04 ENCOUNTER — Telehealth: Payer: Self-pay

## 2022-04-04 NOTE — Telephone Encounter (Signed)
A Prior Authorization was initiated for this patients OZEMPIC through CoverMyMeds.   Key: [id number]

## 2022-04-04 NOTE — Telephone Encounter (Signed)
Prior Auth for patients medication OZEMPIC denied by [facility] MEDICARE via CoverMyMeds.   Reason:  This drug is used for weight loss purposes, which is one of the classes not covered under the Part D coverage. We do not offer supplemental benefit that would cover this drug.  CoverMyMeds Key: [id number]

## 2022-04-09 ENCOUNTER — Other Ambulatory Visit: Payer: Self-pay | Admitting: Family Medicine

## 2022-04-10 ENCOUNTER — Telehealth: Payer: Self-pay

## 2022-04-10 MED ORDER — ARIPIPRAZOLE 20 MG PO TABS: ORAL_TABLET | ORAL | 1 refills | Status: DC

## 2022-04-10 MED ORDER — DULOXETINE HCL 30 MG PO CPEP: 30.0000 mg | ORAL_CAPSULE | Freq: Every day | ORAL | 1 refills | Status: DC

## 2022-04-10 NOTE — Telephone Encounter (Signed)
Patient calls nurse line regarding refill request on abilify and duloxetine. She states that she takes both of these medications daily for depression.   Advised that we received request on duloxetine, however, no request received for Abilify. Will forward to PCP for further advisement regarding refills.   If appropriate, please send to [facility] in [place].   Talbot Grumbling, RN

## 2022-04-10 NOTE — Telephone Encounter (Signed)
Refills for duloxetine and aripiprazole sent in.  She had told me last visit she was not taking duloxetine anymore but I assume this was an error.

## 2022-04-18 ENCOUNTER — Telehealth: Payer: Self-pay | Admitting: Obstetrics & Gynecology

## 2022-04-18 NOTE — Telephone Encounter (Signed)
Patient states that the estradiol doesn't seem to be working anymore and was wanting to see if the dosage could be increased or if there was something else she could be put on. Please advise.

## 2022-04-25 ENCOUNTER — Other Ambulatory Visit (HOSPITAL_COMMUNITY): Payer: Self-pay

## 2022-05-02 ENCOUNTER — Other Ambulatory Visit (HOSPITAL_COMMUNITY): Payer: Self-pay

## 2022-05-06 NOTE — Progress Notes (Unsigned)
    SUBJECTIVE:   CHIEF COMPLAINT / HPI:  No chief complaint on file.   ***  PERTINENT  PMH / PSH: ***  Patient Care Team: Zola Button, MD as PCP - General (Family Medicine) Gala Romney, [name], MD (Gastroenterology) Melvenia Beam, MD as Consulting Physician (Neurology)   OBJECTIVE:   LMP 05/12/2016   Physical Exam      03/22/2022    9:16 AM  Depression screen PHQ 2/9  Decreased Interest 1  Down, Depressed, Hopeless 1  PHQ - 2 Score 2  Altered sleeping 2  Tired, decreased energy 3  Change in appetite 3  Feeling bad or failure about yourself  0  Trouble concentrating 2  Moving slowly or fidgety/restless 0  Suicidal thoughts 0  PHQ-9 Score 12  Difficult doing work/chores Somewhat difficult     {Show previous vital signs (optional):23777}  {Labs  Heme  Chem  Endocrine  Serology  Results Review (optional):23779}  ASSESSMENT/PLAN:   No problem-specific Assessment & Plan notes found for this encounter.    No follow-ups on file.   Zola Button, MD [place]

## 2022-05-06 NOTE — Patient Instructions (Incomplete)
It was nice seeing you today!  Try the doxepin 1 tablet at bedtime as needed for sleep.  Start taking amlodipine 2.5 mg once a day for blood pressure.  Stay well, Littie Deeds, MD [facility] [phone]  --  Make sure to check out at the front desk before you leave today.  Please arrive at least 15 minutes prior to your scheduled appointments.  If you had blood work today, I will send you a MyChart message or a letter if results are normal. Otherwise, I will give you a call.  If you had a referral placed, they will call you to set up an appointment. Please give [place] a call if you don't hear back in the next 2 weeks.  If you need additional refills before your next appointment, please call your pharmacy first.  -- Blood Pressure Record Sheet To take your blood pressure, you will need a blood pressure machine. You can buy a blood pressure machine (blood pressure monitor) at your clinic, drug store, or online. When choosing one, consider: An automatic monitor that has an arm cuff. A cuff that wraps snugly around your upper arm. You should be able to fit only one finger between your arm and the cuff. A device that stores blood pressure reading results. Do not choose a monitor that measures your blood pressure from your wrist or finger. Follow your health care provider's instructions for how to take your blood pressure. To use this form: Take your blood pressure medications every day These measurements should be taken when you have been at rest for at least 10-15 min Take at least 2 readings with each blood pressure check. This makes sure the results are correct. Wait 1-2 minutes between measurements. Write down the results in the spaces on this form. Keep in mind it should always be recorded systolic over diastolic. Both numbers are important.  Repeat this every day for 2-3 weeks, or as told by your health care provider.  Make a follow-up appointment with your  health care provider to discuss the results.  Blood Pressure Log Date Medications taken? (Y/N) Blood Pressure Time of Day

## 2022-05-08 ENCOUNTER — Ambulatory Visit: Payer: Medicare Other | Admitting: Family Medicine

## 2022-05-08 ENCOUNTER — Encounter: Payer: Self-pay | Admitting: Family Medicine

## 2022-05-08 VITALS — BP 127/89 | HR 75 | Ht 67.0 in | Wt 240.1 lb

## 2022-05-08 DIAGNOSIS — I10 Essential (primary) hypertension: Secondary | ICD-10-CM

## 2022-05-08 DIAGNOSIS — G4700 Insomnia, unspecified: Secondary | ICD-10-CM

## 2022-05-08 MED ORDER — ARIPIPRAZOLE 20 MG PO TABS: 20.0000 mg | ORAL_TABLET | Freq: Every day | ORAL | 1 refills | Status: DC

## 2022-05-08 MED ORDER — DOXEPIN HCL 10 MG PO CAPS: 10.0000 mg | ORAL_CAPSULE | Freq: Every evening | ORAL | 0 refills | Status: DC | PRN

## 2022-05-08 MED ORDER — AMLODIPINE BESYLATE 2.5 MG PO TABS: 2.5000 mg | ORAL_TABLET | Freq: Every day | ORAL | 2 refills | Status: DC

## 2022-05-08 MED ORDER — PANTOPRAZOLE SODIUM 40 MG PO TBEC: 40.0000 mg | DELAYED_RELEASE_TABLET | Freq: Every day | ORAL | 1 refills | Status: DC

## 2022-05-09 DIAGNOSIS — I10 Essential (primary) hypertension: Secondary | ICD-10-CM | POA: Insufficient documentation

## 2022-05-09 NOTE — Assessment & Plan Note (Addendum)
Unfortunately GLP-1 agonist was not covered by insurance.  I discussed that I would not recommend any of the other weight loss medications due to poor efficacy and multiple risks and side effects.

## 2022-05-09 NOTE — Assessment & Plan Note (Signed)
Likely resulting from underlying stress and anxiety. - trial doxepin - if no improvement, consider trazodone

## 2022-05-09 NOTE — Assessment & Plan Note (Signed)
Controlled in office but given elevated ambulatory blood pressure readings, and with shared decision making opted to start antihypertensive.  Will start low-dose amlodipine.

## 2022-05-11 ENCOUNTER — Other Ambulatory Visit: Payer: Self-pay | Admitting: Obstetrics & Gynecology

## 2022-05-14 DIAGNOSIS — M25511 Pain in right shoulder: Secondary | ICD-10-CM | POA: Diagnosis not present

## 2022-05-14 DIAGNOSIS — M25512 Pain in left shoulder: Secondary | ICD-10-CM | POA: Diagnosis not present

## 2022-05-16 ENCOUNTER — Other Ambulatory Visit (HOSPITAL_COMMUNITY): Payer: Self-pay | Admitting: Family Medicine

## 2022-05-16 ENCOUNTER — Telehealth: Payer: Self-pay | Admitting: Family Medicine

## 2022-05-16 ENCOUNTER — Ambulatory Visit (INDEPENDENT_AMBULATORY_CARE_PROVIDER_SITE_OTHER): Payer: Medicare Other | Admitting: Obstetrics & Gynecology

## 2022-05-16 ENCOUNTER — Encounter: Payer: Self-pay | Admitting: Obstetrics & Gynecology

## 2022-05-16 VITALS — BP 132/88 | HR 69 | Ht 68.0 in | Wt 242.0 lb

## 2022-05-16 DIAGNOSIS — N3281 Overactive bladder: Secondary | ICD-10-CM | POA: Diagnosis not present

## 2022-05-16 DIAGNOSIS — M25511 Pain in right shoulder: Secondary | ICD-10-CM

## 2022-05-16 DIAGNOSIS — N951 Menopausal and female climacteric states: Secondary | ICD-10-CM | POA: Diagnosis not present

## 2022-05-16 MED ORDER — ESTROGENS CONJUGATED 1.25 MG PO TABS: 1.2500 mg | ORAL_TABLET | Freq: Every day | ORAL | 11 refills | Status: AC

## 2022-05-16 MED ORDER — MIRABEGRON ER 50 MG PO TB24: 50.0000 mg | ORAL_TABLET | Freq: Every day | ORAL | 11 refills | Status: DC

## 2022-05-16 NOTE — Telephone Encounter (Signed)
Contacted [name] to schedule their annual wellness visit. Appointment made for 05/21/2022.  Thank you,  [place] Direct dial  [phone]
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
## 2022-05-17 ENCOUNTER — Ambulatory Visit (INDEPENDENT_AMBULATORY_CARE_PROVIDER_SITE_OTHER): Payer: Medicare Other | Admitting: Pulmonary Disease

## 2022-05-17 ENCOUNTER — Encounter: Payer: Self-pay | Admitting: Pulmonary Disease

## 2022-05-17 VITALS — BP 146/83 | HR 76 | Ht 69.0 in | Wt 241.8 lb

## 2022-05-17 DIAGNOSIS — J432 Centrilobular emphysema: Secondary | ICD-10-CM

## 2022-05-17 DIAGNOSIS — F17200 Nicotine dependence, unspecified, uncomplicated: Secondary | ICD-10-CM

## 2022-05-17 MED ORDER — BREZTRI AEROSPHERE 160-9-4.8 MCG/ACT IN AERO: 2.0000 | INHALATION_SPRAY | Freq: Two times a day (BID) | RESPIRATORY_TRACT | 0 refills | Status: DC

## 2022-05-17 MED ORDER — NICOTINE 10 MG IN INHA: 1.0000 | RESPIRATORY_TRACT | 0 refills | Status: DC | PRN

## 2022-05-17 NOTE — Patient Instructions (Addendum)
ask about wellbutrin  X Trial of nicotrol inhaler   X Sample of Breztri - 2 puffs twice daily  X Amb sat
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
## 2022-05-17 NOTE — Assessment & Plan Note (Signed)
Smoking cessation again emphasized is the most important intervention.  She will try nicotine inhaler since she is unable to try nicotine patch and was unable to direct nicotine lozenges or gum.  Did not tolerate well Nicklin due to nightmares. Best to avoid bupropion here given history of seizures

## 2022-05-17 NOTE — Assessment & Plan Note (Signed)
Trial of Breztri, she has not tolerated at least 2 other inhalers including Breo and Anoro. She will use albuterol for rescue. We discussed COPD action plan and signs and symptoms of COPD exacerbation

## 2022-05-21 ENCOUNTER — Telehealth: Payer: Self-pay

## 2022-05-21 NOTE — Telephone Encounter (Signed)
Unsuccessful attempt to reach patient on preferred number listed in notes for scheduled AWV. Left message on voicemail okay to reschedule. 

## 2022-05-23 ENCOUNTER — Other Ambulatory Visit: Payer: Self-pay | Admitting: Obstetrics & Gynecology

## 2022-05-23 ENCOUNTER — Telehealth: Payer: Self-pay | Admitting: *Deleted

## 2022-05-23 MED ORDER — ESTRADIOL 0.1 MG/24HR TD PTTW: 1.0000 | MEDICATED_PATCH | TRANSDERMAL | 12 refills | Status: AC

## 2022-05-23 NOTE — Telephone Encounter (Signed)
Premarin tablets have been denied by insurance. Preferred alternatives are Imvexxy, Estradiol weekly, Estradiol Valerate.  Let me know if these are not an option and I will attempt PA.

## 2022-05-27 ENCOUNTER — Ambulatory Visit: Payer: Medicare Other | Admitting: Pulmonary Disease

## 2022-05-28 DIAGNOSIS — Z978 Presence of other specified devices: Secondary | ICD-10-CM | POA: Diagnosis not present

## 2022-06-06 ENCOUNTER — Encounter (HOSPITAL_COMMUNITY): Payer: Self-pay

## 2022-06-06 ENCOUNTER — Ambulatory Visit (HOSPITAL_COMMUNITY)
Admission: RE | Admit: 2022-06-06 | Discharge: 2022-06-06 | Disposition: A | Discharge status: Home / Self Care | Payer: Medicare Other | Source: Ambulatory Visit | Attending: Family Medicine | Admitting: Family Medicine

## 2022-06-06 DIAGNOSIS — M25511 Pain in right shoulder: Secondary | ICD-10-CM

## 2022-06-07 ENCOUNTER — Ambulatory Visit (INDEPENDENT_AMBULATORY_CARE_PROVIDER_SITE_OTHER): Payer: Medicare Other

## 2022-06-07 VITALS — Ht 67.0 in | Wt 241.0 lb

## 2022-06-07 DIAGNOSIS — Z0000 Encounter for general adult medical examination without abnormal findings: Secondary | ICD-10-CM

## 2022-06-07 DIAGNOSIS — Z1231 Encounter for screening mammogram for malignant neoplasm of breast: Secondary | ICD-10-CM
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
## 2022-06-07 NOTE — Patient Instructions (Signed)
Shelby [name] , Thank you for taking time to come for your Medicare Wellness Visit. I appreciate your ongoing commitment to your health goals. Please review the following plan we discussed and let me know if I can assist you in the future.   These are the goals we discussed:  Goals      Exercise 150 min/wk Moderate Activity        This is a list of the screening recommended for you and due dates:  Health Maintenance  Topic Date Due   HIV Screening  Never done   Pap Smear  07/23/2019   COVID-19 Vaccine (3 - Moderna risk series) 01/23/2020   Mammogram  06/26/2022   Flu Shot  09/12/2022   Medicare Annual Wellness Visit  06/07/2023   DTaP/Tdap/Td vaccine (2 - Td or Tdap) 06/21/2026   Colon Cancer Screening  09/23/2027   Hepatitis C Screening: USPSTF Recommendation to screen - Ages 18-79 yo.  Completed   HPV Vaccine  Aged Out    Advanced directives:    Discussed with patient  Conditions/risks identified: Keep up the good work  Next appointment: Follow up in one year for your annual wellness visit. 07/08/23  Preventive Care 40-64 Years, Female Preventive care refers to lifestyle choices and visits with your health care provider that can promote health and wellness. What does preventive care include? A yearly physical exam. This is also called an annual well check. Dental exams once or twice a year. Routine eye exams. Ask your health care provider how often you should have your eyes checked. Personal lifestyle choices, including: Daily care of your teeth and gums. Regular physical activity. Eating a healthy diet. Avoiding tobacco and drug use. Limiting alcohol use. Practicing safe sex. Taking low-dose aspirin daily starting at age 33. Taking vitamin and mineral supplements as recommended by your health care provider. What happens during an annual well check? The services and screenings done by your health care provider during your annual well check will depend on your age,  overall health, lifestyle risk factors, and family history of disease. Counseling  Your health care provider may ask you questions about your: Alcohol use. Tobacco use. Drug use. Emotional well-being. Home and relationship well-being. Sexual activity. Eating habits. Work and work Astronomer. Method of birth control. Menstrual cycle. Pregnancy history. Screening  You may have the following tests or measurements: Height, weight, and BMI. Blood pressure. Lipid and cholesterol levels. These may be checked every 5 years, or more frequently if you are over 21 years old. Skin check. Lung cancer screening. You may have this screening every year starting at age 20 if you have a 30-pack-year history of smoking and currently smoke or have quit within the past 15 years. Fecal occult blood test (FOBT) of the stool. You may have this test every year starting at age 84. Flexible sigmoidoscopy or colonoscopy. You may have a sigmoidoscopy every 5 years or a colonoscopy every 10 years starting at age 44. Hepatitis C blood test. Hepatitis B blood test. Sexually transmitted disease (STD) testing. Diabetes screening. This is done by checking your blood sugar (glucose) after you have not eaten for a while (fasting). You may have this done every 1-3 years. Mammogram. This may be done every 1-2 years. Talk to your health care provider about when you should start having regular mammograms. This may depend on whether you have a family history of breast cancer. BRCA-related cancer screening. This may be done if you have a family history of breast,  ovarian, tubal, or peritoneal cancers. Pelvic exam and Pap test. This may be done every 3 years starting at age 52. Starting at age 31, this may be done every 5 years if you have a Pap test in combination with an HPV test. Bone density scan. This is done to screen for osteoporosis. You may have this scan if you are at high risk for osteoporosis. Discuss your test  results, treatment options, and if necessary, the need for more tests with your health care provider. Vaccines  Your health care provider may recommend certain vaccines, such as: Influenza vaccine. This is recommended every year. Tetanus, diphtheria, and acellular pertussis (Tdap, Td) vaccine. You may need a Td booster every 10 years. Zoster vaccine. You may need this after age 66. Pneumococcal 13-valent conjugate (PCV13) vaccine. You may need this if you have certain conditions and were not previously vaccinated. Pneumococcal polysaccharide (PPSV23) vaccine. You may need one or two doses if you smoke cigarettes or if you have certain conditions. Talk to your health care provider about which screenings and vaccines you need and how often you need them. This information is not intended to replace advice given to you by your health care provider. Make sure you discuss any questions you have with your health care provider. Document Released: 02/24/2015 Document Revised: 10/18/2015 Document Reviewed: 11/29/2014 Elsevier Interactive Patient Education  2017 [employer].    Fall Prevention in the Home Falls can cause injuries. They can happen to people of all ages. There are many things you can do to make your home safe and to help prevent falls. What can I do on the outside of my home? Regularly fix the edges of walkways and driveways and fix any cracks. Remove anything that might make you trip as you walk through a door, such as a raised step or threshold. Trim any bushes or trees on the path to your home. Use bright outdoor lighting. Clear any walking paths of anything that might make someone trip, such as rocks or tools. Regularly check to see if handrails are loose or broken. Make sure that both sides of any steps have handrails. Any raised decks and porches should have guardrails on the edges. Have any leaves, snow, or ice cleared regularly. Use sand or salt on walking paths during  winter. Clean up any spills in your garage right away. This includes oil or grease spills. What can I do in the bathroom? Use night lights. Install grab bars by the toilet and in the tub and shower. Do not use towel bars as grab bars. Use non-skid mats or decals in the tub or shower. If you need to sit down in the shower, use a plastic, non-slip stool. Keep the floor dry. Clean up any water that spills on the floor as soon as it happens. Remove soap buildup in the tub or shower regularly. Attach bath mats securely with double-sided non-slip rug tape. Do not have throw rugs and other things on the floor that can make you trip. What can I do in the bedroom? Use night lights. Make sure that you have a light by your bed that is easy to reach. Do not use any sheets or blankets that are too big for your bed. They should not hang down onto the floor. Have a firm chair that has side arms. You can use this for support while you get dressed. Do not have throw rugs and other things on the floor that can make you trip. What can  I do in the kitchen? Clean up any spills right away. Avoid walking on wet floors. Keep items that you use a lot in easy-to-reach places. If you need to reach something above you, use a strong step stool that has a grab bar. Keep electrical cords out of the way. Do not use floor polish or wax that makes floors slippery. If you must use wax, use non-skid floor wax. Do not have throw rugs and other things on the floor that can make you trip. What can I do with my stairs? Do not leave any items on the stairs. Make sure that there are handrails on both sides of the stairs and use them. Fix handrails that are broken or loose. Make sure that handrails are as long as the stairways. Check any carpeting to make sure that it is firmly attached to the stairs. Fix any carpet that is loose or worn. Avoid having throw rugs at the top or bottom of the stairs. If you do have throw rugs,  attach them to the floor with carpet tape. Make sure that you have a light switch at the top of the stairs and the bottom of the stairs. If you do not have them, ask someone to add them for you. What else can I do to help prevent falls? Wear shoes that: Do not have high heels. Have rubber bottoms. Are comfortable and fit you well. Are closed at the toe. Do not wear sandals. If you use a stepladder: Make sure that it is fully opened. Do not climb a closed stepladder. Make sure that both sides of the stepladder are locked into place. Ask someone to hold it for you, if possible. Clearly mark and make sure that you can see: Any grab bars or handrails. First and last steps. Where the edge of each step is. Use tools that help you move around (mobility aids) if they are needed. These include: Canes. Walkers. Scooters. Crutches. Turn on the lights when you go into a dark area. Replace any light bulbs as soon as they burn out. Set up your furniture so you have a clear path. Avoid moving your furniture around. If any of your floors are uneven, fix them. If there are any pets around you, be aware of where they are. Review your medicines with your doctor. Some medicines can make you feel dizzy. This can increase your chance of falling. Ask your doctor what other things that you can do to help prevent falls. This information is not intended to replace advice given to you by your health care provider. Make sure you discuss any questions you have with your health care provider. Document Released: 11/24/2008 Document Revised: 07/06/2015 Document Reviewed: 03/04/2014 Elsevier Interactive Patient Education  2017 [employer].

## 2022-06-14 DIAGNOSIS — T85840A Pain due to nervous system prosthetic devices, implants and grafts, initial encounter: Secondary | ICD-10-CM | POA: Diagnosis not present

## 2022-06-14 DIAGNOSIS — T85193A Other mechanical complication of implanted electronic neurostimulator, generator, initial encounter: Secondary | ICD-10-CM | POA: Diagnosis not present

## 2022-06-19 ENCOUNTER — Other Ambulatory Visit: Payer: Self-pay | Admitting: Family Medicine

## 2022-06-19 DIAGNOSIS — M25511 Pain in right shoulder: Secondary | ICD-10-CM

## 2022-06-24 ENCOUNTER — Other Ambulatory Visit: Payer: Self-pay

## 2022-06-24 DIAGNOSIS — G4700 Insomnia, unspecified: Secondary | ICD-10-CM

## 2022-06-24 MED ORDER — DOXEPIN HCL 10 MG PO CAPS: 10.0000 mg | ORAL_CAPSULE | Freq: Every evening | ORAL | 0 refills | Status: DC | PRN

## 2022-07-03 ENCOUNTER — Inpatient Hospital Stay
Admission: RE | Admit: 2022-07-03 | Discharge: 2022-07-03 | Disposition: A | Discharge status: Home / Self Care | Payer: Medicare Other | Source: Ambulatory Visit | Attending: Family Medicine | Admitting: Family Medicine

## 2022-07-03 ENCOUNTER — Other Ambulatory Visit: Payer: Medicare Other

## 2022-07-04 ENCOUNTER — Other Ambulatory Visit: Payer: Self-pay

## 2022-07-04 ENCOUNTER — Encounter: Payer: Self-pay | Admitting: Family Medicine

## 2022-07-04 MED ORDER — BREZTRI AEROSPHERE 160-9-4.8 MCG/ACT IN AERO: 2.0000 | INHALATION_SPRAY | Freq: Two times a day (BID) | RESPIRATORY_TRACT | 0 refills | Status: DC

## 2022-07-04 NOTE — Progress Notes (Signed)
Opened encounter to chart sample inhaler.

## 2022-07-19 ENCOUNTER — Ambulatory Visit
Admission: RE | Admit: 2022-07-19 | Discharge: 2022-07-19 | Disposition: A | Discharge status: Home / Self Care | Payer: Medicare Other | Source: Ambulatory Visit | Attending: Family Medicine | Admitting: Family Medicine

## 2022-07-19 DIAGNOSIS — M25511 Pain in right shoulder: Secondary | ICD-10-CM

## 2022-07-19 MED ORDER — IOPAMIDOL (ISOVUE-M 200) INJECTION 41%
15.0000 mL | Freq: Once | INTRAMUSCULAR | Status: AC
  Administered 2022-07-19: 15 mL via INTRA_ARTICULAR

## 2022-07-23 DIAGNOSIS — M25511 Pain in right shoulder: Secondary | ICD-10-CM | POA: Diagnosis not present

## 2022-07-30 ENCOUNTER — Other Ambulatory Visit: Payer: Self-pay | Admitting: Family Medicine

## 2022-07-30 DIAGNOSIS — G4700 Insomnia, unspecified: Secondary | ICD-10-CM

## 2022-08-12 DIAGNOSIS — M5416 Radiculopathy, lumbar region: Secondary | ICD-10-CM | POA: Diagnosis not present

## 2022-08-12 DIAGNOSIS — Z79899 Other long term (current) drug therapy: Secondary | ICD-10-CM | POA: Diagnosis not present

## 2022-08-12 DIAGNOSIS — Z5181 Encounter for therapeutic drug level monitoring: Secondary | ICD-10-CM | POA: Diagnosis not present

## 2022-08-12 DIAGNOSIS — M961 Postlaminectomy syndrome, not elsewhere classified: Secondary | ICD-10-CM | POA: Diagnosis not present

## 2022-08-12 HISTORY — PX: SHOULDER ARTHROSCOPY: SHX128

## 2022-08-13 ENCOUNTER — Other Ambulatory Visit: Payer: Self-pay

## 2022-08-13 DIAGNOSIS — I10 Essential (primary) hypertension: Secondary | ICD-10-CM

## 2022-08-13 MED ORDER — AMLODIPINE BESYLATE 2.5 MG PO TABS: 2.5000 mg | ORAL_TABLET | Freq: Every day | ORAL | 2 refills | Status: DC

## 2022-08-19 ENCOUNTER — Ambulatory Visit: Payer: Medicare Other | Admitting: Obstetrics & Gynecology

## 2022-08-20 ENCOUNTER — Encounter: Payer: Self-pay | Admitting: Obstetrics & Gynecology

## 2022-08-20 ENCOUNTER — Encounter: Payer: Self-pay | Admitting: Family Medicine

## 2022-08-20 ENCOUNTER — Ambulatory Visit (INDEPENDENT_AMBULATORY_CARE_PROVIDER_SITE_OTHER): Payer: Medicare Other | Admitting: Obstetrics & Gynecology

## 2022-08-20 ENCOUNTER — Other Ambulatory Visit: Payer: Self-pay | Admitting: Family Medicine

## 2022-08-20 VITALS — BP 132/82 | HR 75 | Wt 245.0 lb

## 2022-08-20 DIAGNOSIS — N951 Menopausal and female climacteric states: Secondary | ICD-10-CM

## 2022-08-20 DIAGNOSIS — N3281 Overactive bladder: Secondary | ICD-10-CM

## 2022-08-20 DIAGNOSIS — G43709 Chronic migraine without aura, not intractable, without status migrainosus: Secondary | ICD-10-CM

## 2022-08-20 MED ORDER — VEOZAH 45 MG PO TABS: 1.0000 | ORAL_TABLET | Freq: Every day | ORAL | 11 refills | Status: DC

## 2022-08-20 MED ORDER — SOLIFENACIN SUCCINATE 10 MG PO TABS: 10.0000 mg | ORAL_TABLET | Freq: Every day | ORAL | 11 refills | Status: DC
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
## 2022-08-21 DIAGNOSIS — M19011 Primary osteoarthritis, right shoulder: Secondary | ICD-10-CM | POA: Diagnosis not present

## 2022-08-21 DIAGNOSIS — M7541 Impingement syndrome of right shoulder: Secondary | ICD-10-CM | POA: Diagnosis not present

## 2022-08-22 DIAGNOSIS — M19019 Primary osteoarthritis, unspecified shoulder: Secondary | ICD-10-CM | POA: Insufficient documentation

## 2022-08-22 DIAGNOSIS — M7541 Impingement syndrome of right shoulder: Secondary | ICD-10-CM | POA: Insufficient documentation

## 2022-08-23 ENCOUNTER — Other Ambulatory Visit: Payer: Self-pay | Admitting: Family Medicine

## 2022-08-23 DIAGNOSIS — G43709 Chronic migraine without aura, not intractable, without status migrainosus: Secondary | ICD-10-CM | POA: Diagnosis not present

## 2022-08-24 LAB — COMPREHENSIVE METABOLIC PANEL
ALT: 15 IU/L (ref 0–32)
AST: 15 IU/L (ref 0–40)
Albumin: 4.3 g/dL (ref 3.9–4.9)
Alkaline Phosphatase: 81 IU/L (ref 44–121)
BUN/Creatinine Ratio: 8 — ABNORMAL LOW (ref 9–23)
BUN: 6 mg/dL (ref 6–24)
Bilirubin Total: 0.4 mg/dL (ref 0.0–1.2)
CO2: 24 mmol/L (ref 20–29)
Calcium: 9.1 mg/dL (ref 8.7–10.2)
Chloride: 103 mmol/L (ref 96–106)
Creatinine, Ser: 0.78 mg/dL (ref 0.57–1.00)
Globulin, Total: 2.3 g/dL (ref 1.5–4.5)
Glucose: 95 mg/dL (ref 70–99)
Potassium: 3.8 mmol/L (ref 3.5–5.2)
Sodium: 141 mmol/L (ref 134–144)
Total Protein: 6.6 g/dL (ref 6.0–8.5)
eGFR: 95 mL/min/{1.73_m2} (ref >59)

## 2022-08-30 ENCOUNTER — Encounter: Payer: Self-pay | Admitting: Pulmonary Disease

## 2022-08-30 ENCOUNTER — Ambulatory Visit: Payer: Medicare Other | Admitting: Pulmonary Disease

## 2022-09-09 DIAGNOSIS — G8918 Other acute postprocedural pain: Secondary | ICD-10-CM | POA: Diagnosis not present

## 2022-09-09 DIAGNOSIS — M7541 Impingement syndrome of right shoulder: Secondary | ICD-10-CM | POA: Diagnosis not present

## 2022-09-09 DIAGNOSIS — S46011A Strain of muscle(s) and tendon(s) of the rotator cuff of right shoulder, initial encounter: Secondary | ICD-10-CM | POA: Diagnosis not present

## 2022-09-09 DIAGNOSIS — M25711 Osteophyte, right shoulder: Secondary | ICD-10-CM | POA: Diagnosis not present

## 2022-09-09 DIAGNOSIS — M948X1 Other specified disorders of cartilage, shoulder: Secondary | ICD-10-CM | POA: Diagnosis not present

## 2022-09-09 DIAGNOSIS — M7581 Other shoulder lesions, right shoulder: Secondary | ICD-10-CM | POA: Diagnosis not present

## 2022-09-09 DIAGNOSIS — M19011 Primary osteoarthritis, right shoulder: Secondary | ICD-10-CM | POA: Diagnosis not present

## 2022-09-09 DIAGNOSIS — M7551 Bursitis of right shoulder: Secondary | ICD-10-CM | POA: Diagnosis not present

## 2022-09-12 ENCOUNTER — Ambulatory Visit (HOSPITAL_BASED_OUTPATIENT_CLINIC_OR_DEPARTMENT_OTHER): Payer: Medicare Other

## 2022-09-12 ENCOUNTER — Encounter (HOSPITAL_BASED_OUTPATIENT_CLINIC_OR_DEPARTMENT_OTHER): Payer: Self-pay | Admitting: Pulmonary Disease

## 2022-09-12 ENCOUNTER — Ambulatory Visit (INDEPENDENT_AMBULATORY_CARE_PROVIDER_SITE_OTHER): Payer: Medicare Other | Admitting: Pulmonary Disease

## 2022-09-12 VITALS — BP 100/68 | HR 70 | Ht 67.0 in | Wt 242.4 lb

## 2022-09-12 DIAGNOSIS — J4489 Other specified chronic obstructive pulmonary disease: Secondary | ICD-10-CM

## 2022-09-12 DIAGNOSIS — R0600 Dyspnea, unspecified: Secondary | ICD-10-CM | POA: Diagnosis not present

## 2022-09-12 DIAGNOSIS — F17200 Nicotine dependence, unspecified, uncomplicated: Secondary | ICD-10-CM

## 2022-09-12 DIAGNOSIS — J432 Centrilobular emphysema: Secondary | ICD-10-CM

## 2022-09-12 MED ORDER — BREZTRI AEROSPHERE 160-9-4.8 MCG/ACT IN AERO: 2.0000 | INHALATION_SPRAY | Freq: Two times a day (BID) | RESPIRATORY_TRACT | 3 refills | Status: DC
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
## 2022-09-12 NOTE — Assessment & Plan Note (Signed)
Chest x-ray was obtained no evidence of diaphragm elevation related to nerve block. She will continue on Breztri, reports will be provided We discussed COPD action plan and signs and symptoms of COPD exacerbation

## 2022-09-12 NOTE — Assessment & Plan Note (Addendum)
Refills on Breztri. This works better than [employer] or [employer]. Albuterol to use as needed for breakthrough Her dyspnea is much worse after shoulder surgery and nerve block.  Will obtain x-ray to make sure that she does not have diaphragm paralysis

## 2022-09-12 NOTE — Patient Instructions (Addendum)
CXR today  Refill on Breztri  Nicotine patch 21 mg/day x 1 month with 2 refills We can add on Nicotrol inhaler once you are able to cut down on smoking

## 2022-09-12 NOTE — Assessment & Plan Note (Signed)
Nicotine patch 21 mg/day x 1 month with 2 refills We can add on Nicotrol inhaler once you are able to cut down on smoking

## 2022-09-13 DIAGNOSIS — M25511 Pain in right shoulder: Secondary | ICD-10-CM | POA: Diagnosis not present

## 2022-09-13 DIAGNOSIS — M25611 Stiffness of right shoulder, not elsewhere classified: Secondary | ICD-10-CM | POA: Diagnosis not present

## 2022-09-17 DIAGNOSIS — M25511 Pain in right shoulder: Secondary | ICD-10-CM | POA: Diagnosis not present

## 2022-09-17 DIAGNOSIS — M25611 Stiffness of right shoulder, not elsewhere classified: Secondary | ICD-10-CM | POA: Diagnosis not present

## 2022-09-19 DIAGNOSIS — M25611 Stiffness of right shoulder, not elsewhere classified: Secondary | ICD-10-CM | POA: Diagnosis not present

## 2022-09-19 DIAGNOSIS — M25511 Pain in right shoulder: Secondary | ICD-10-CM | POA: Diagnosis not present

## 2022-09-20 ENCOUNTER — Encounter: Payer: Self-pay | Admitting: Family Medicine

## 2022-09-26 ENCOUNTER — Ambulatory Visit: Payer: Medicare Other | Admitting: Pulmonary Disease

## 2022-09-26 ENCOUNTER — Ambulatory Visit: Payer: Medicare Other | Admitting: Family Medicine

## 2022-09-27 ENCOUNTER — Encounter: Payer: Self-pay | Admitting: Pulmonary Disease

## 2022-09-27 ENCOUNTER — Ambulatory Visit (INDEPENDENT_AMBULATORY_CARE_PROVIDER_SITE_OTHER): Payer: Medicare Other | Admitting: Pulmonary Disease

## 2022-09-27 VITALS — BP 140/86 | HR 82 | Ht 67.0 in | Wt 243.0 lb

## 2022-09-27 DIAGNOSIS — F17200 Nicotine dependence, unspecified, uncomplicated: Secondary | ICD-10-CM | POA: Diagnosis not present

## 2022-09-27 DIAGNOSIS — J4489 Other specified chronic obstructive pulmonary disease: Secondary | ICD-10-CM

## 2022-09-27 NOTE — Patient Instructions (Signed)
X  Rx for Nicotrol inhaler -2 puffs every 2 hours as needed #50
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
## 2022-09-27 NOTE — Assessment & Plan Note (Signed)
She was using Breztri only once daily when I asked her to use this twice daily. We discussed action plan for COPD and signs and symptoms of COPD exacerbation

## 2022-09-27 NOTE — Assessment & Plan Note (Signed)
Rx for Nicotrol inhaler -2 puffs every 2 hours as needed #50 I also asked her to call the quit line to obtain patches

## 2022-10-01 DIAGNOSIS — M25511 Pain in right shoulder: Secondary | ICD-10-CM | POA: Diagnosis not present

## 2022-10-01 DIAGNOSIS — M25611 Stiffness of right shoulder, not elsewhere classified: Secondary | ICD-10-CM | POA: Diagnosis not present

## 2022-10-17 ENCOUNTER — Encounter: Payer: Self-pay | Admitting: Family Medicine

## 2022-10-17 ENCOUNTER — Ambulatory Visit (INDEPENDENT_AMBULATORY_CARE_PROVIDER_SITE_OTHER): Payer: Medicare Other | Admitting: Family Medicine

## 2022-10-17 VITALS — BP 133/77 | HR 67 | Ht 67.0 in | Wt 242.0 lb

## 2022-10-17 DIAGNOSIS — Z23 Encounter for immunization: Secondary | ICD-10-CM

## 2022-10-17 DIAGNOSIS — G43009 Migraine without aura, not intractable, without status migrainosus: Secondary | ICD-10-CM | POA: Diagnosis not present

## 2022-10-17 DIAGNOSIS — R5382 Chronic fatigue, unspecified: Secondary | ICD-10-CM | POA: Diagnosis not present

## 2022-10-17 DIAGNOSIS — F419 Anxiety disorder, unspecified: Secondary | ICD-10-CM | POA: Diagnosis not present

## 2022-10-17 DIAGNOSIS — R5383 Other fatigue: Secondary | ICD-10-CM

## 2022-10-17 LAB — POCT HEMOGLOBIN: Hemoglobin: 14 g/dL (ref 11–14.6)

## 2022-10-17 MED ORDER — HYDROXYZINE HCL 10 MG PO TABS: 10.0000 mg | ORAL_TABLET | Freq: Every day | ORAL | 0 refills | Status: DC

## 2022-10-17 MED ORDER — MAGNESIUM OXIDE 400 MG PO CAPS: 400.0000 mg | ORAL_CAPSULE | Freq: Every day | ORAL | 0 refills | Status: DC | PRN

## 2022-10-17 NOTE — Progress Notes (Signed)
    SUBJECTIVE:   CHIEF COMPLAINT / HPI:   Fatigue -Patient states that she has had no energy and fatigue for "a while ", probably the last 6 months -Patient does note that she had a sleep apnea test a few years ago, and they told her to start using 2 L of oxygen at night.  She does not use CPAP.  Husband states that patient snores and will wake up choking or wake up from her snoring.  Patient states that she cannot keep the nasal cannula on her nose the entire night.  Anxiety -Patient does note anxiety related to her family and job. -States that she has difficulty falling asleep due to her anxiety -Patient feels like her Cymbalta and Abilify are not helping, states that she previously took Xanax at night to help her sleep.  States that since she stopped taking the Xanax she has not slept well at all. -Patient has seen a psychiatrist in the past but felt like they did not listen to her  Headaches -Patient notes worsening headaches over the last week.  States that she has had 4 headaches in the last week.  States that they are frontal, last a few hours, and are worsened by light and sound exposure.  Patient takes Tylenol with minimal improvement of the headaches. -Notes history of migraines, states that these headaches are similar but more frequent  PERTINENT  PMH / PSH: Migraines, hypertension, COPD, traumatic brain injury, depression  OBJECTIVE:   BP 133/77   Pulse 67   Ht 5\' 7"  (1.702 m)   Wt 242 lb (109.8 kg)   LMP 05/12/2016   SpO2 98%   BMI 37.90 kg/m   General: Well-appearing, no acute distress CV: Regular rate and rhythm, no murmurs Resp: Clear to auscultation bilaterally, no increased work of breathing Neuro: A&O x 4, no focal neurological deficit, no meningeal signs noted Psych: Normal affect  ASSESSMENT/PLAN:   Migraines These headaches sound consistent with patient's previous migraines.  I do suspect that they have worsened due to increased stress and not sleeping  well recently. Add magnesium oxide to Tylenol for next headaches.  Follow-up in 4 weeks with PCP to reevaluate headaches, could consider preventative migraine medication at next visit Return precautions given regarding sudden headaches that do not resolve  Anxiety Taking Cymbalta and Abilify but states that she does not think these are helping her anxiety. -Start Atarax 10 mg before bedtime to help with sleep -Consider adjusting medication regimen at next visit  Loss of energy Has been going on for several months.  I suspect this is multifactorial.  Patient is under stress from family and work, she also does not sleep well at night. -TSH, hemoglobin -STOP-BANG score 5, patient is high risk for obstructive sleep apnea -Pulmonology referral placed for sleep study -Atarax before bedtime -Follow-up with PCP in 4 weeks    Para March, DO [facility]

## 2022-10-17 NOTE — Assessment & Plan Note (Signed)
Has been going on for several months.  I suspect this is multifactorial.  Patient is under stress from family and work, she also does not sleep well at night. -TSH, hemoglobin -STOP-BANG score 5, patient is high risk for obstructive sleep apnea -Pulmonology referral placed for sleep study -Atarax before bedtime -Follow-up with PCP in 4 weeks

## 2022-10-17 NOTE — Assessment & Plan Note (Signed)
These headaches sound consistent with patient's previous migraines.  I do suspect that they have worsened due to increased stress and not sleeping well recently. Add magnesium oxide to Tylenol for next headaches.  Follow-up in 4 weeks with PCP to reevaluate headaches, could consider preventative migraine medication at next visit Return precautions given regarding sudden headaches that do not resolve

## 2022-10-17 NOTE — Assessment & Plan Note (Addendum)
Taking Cymbalta and Abilify but states that she does not think these are helping her anxiety. -Start Atarax 10 mg before bedtime to help with sleep -Consider adjusting medication regimen at next visit

## 2022-10-17 NOTE — Patient Instructions (Signed)
Good to see you today - Thank you for coming in  Things we discussed today:  Fatigue: Please see the pulmonologist for sleep study.  I have placed this referral for you.  We got some lab work today, if they are abnormal I will reach out to you.  Try the Atarax for sleep at night.  Take 1 before bed.  When you get another headache, take magnesium oxide in addition to the Tylenol.  Follow-up in 3 to 4 weeks regarding your headaches and anxiety.

## 2022-10-18 LAB — TSH RFX ON ABNORMAL TO FREE T4: TSH: 2.72 u[IU]/mL (ref 0.450–4.500)

## 2022-10-30 ENCOUNTER — Telehealth: Payer: Self-pay | Admitting: Pulmonary Disease

## 2022-10-30 DIAGNOSIS — G4734 Idiopathic sleep related nonobstructive alveolar hypoventilation: Secondary | ICD-10-CM

## 2022-10-30 NOTE — Telephone Encounter (Signed)
Patient is calling to check on the status of her sleep study that Dr. Vassie Loll mentioned to her at her August visit.  I did not see an order for the study.  Please advise

## 2022-10-30 NOTE — Telephone Encounter (Signed)
I do not see an order for a sleep study please advise

## 2022-11-01 NOTE — Telephone Encounter (Signed)
Dr. Vassie Loll, please advise on this. Do not see any documentation in last OV about pt needing to have sleep study ordered.

## 2022-11-04 ENCOUNTER — Encounter (HOSPITAL_BASED_OUTPATIENT_CLINIC_OR_DEPARTMENT_OTHER): Payer: Self-pay | Admitting: Pulmonary Disease

## 2022-11-04 ENCOUNTER — Encounter: Payer: Self-pay | Admitting: Family Medicine

## 2022-11-04 NOTE — Telephone Encounter (Signed)
Order for sleep study placed

## 2022-11-06 NOTE — Telephone Encounter (Signed)
Patient states her insurance has always paid 100% and she got patient portion of HST being  $47. She can't afford this at this times her husband having surgery and out of work.

## 2022-11-11 ENCOUNTER — Other Ambulatory Visit: Payer: Self-pay | Admitting: Family Medicine

## 2022-11-11 DIAGNOSIS — I10 Essential (primary) hypertension: Secondary | ICD-10-CM

## 2022-11-12 ENCOUNTER — Encounter: Payer: Self-pay | Admitting: Family Medicine

## 2022-11-12 ENCOUNTER — Ambulatory Visit (INDEPENDENT_AMBULATORY_CARE_PROVIDER_SITE_OTHER): Payer: Medicare Other | Admitting: Family Medicine

## 2022-11-12 VITALS — BP 138/82 | HR 70 | Ht 67.0 in | Wt 242.8 lb

## 2022-11-12 DIAGNOSIS — F419 Anxiety disorder, unspecified: Secondary | ICD-10-CM | POA: Diagnosis not present

## 2022-11-12 DIAGNOSIS — G4700 Insomnia, unspecified: Secondary | ICD-10-CM

## 2022-11-12 MED ORDER — DULOXETINE HCL 60 MG PO CPEP
60.0000 mg | ORAL_CAPSULE | Freq: Every day | ORAL | 3 refills | Status: DC
Start: 2022-11-12 — End: 2023-03-12

## 2022-11-12 NOTE — Assessment & Plan Note (Signed)
I feel that her insomnia is more so related to anxiety given her symptoms of racing thoughts that prevent her from falling back asleep.  Her overall anxiety levels seem to be high during the day as well.  Feel that she would benefit from better overall control of her anxiety.  Increase Cymbalta to 60 mg, can take this in the evenings due to its drowsiness effects as well.  Additionally, encouraged to avoid nicotine products in the evenings as this could be keeping her awake as well.  Follow-up in about 4 weeks

## 2022-11-12 NOTE — Progress Notes (Signed)
    SUBJECTIVE:   CHIEF COMPLAINT / HPI:   Follow-up for anxiety and difficulty staying asleep Seen 9/5 for anxiety, started on Atarax 10 mg nightly  Atarax has helped her fall asleep but has not helped her stay asleep. She wakes up multiple times throughout the night. Stress and racing thoughts keep her awake at night. She was on xanax a while ago which helped her a lot, but she was taken off of this and since then has had trouble staying asleep at night.  She is unable to afford a sleep study but states that she uses nasal cannula at night and does not have difficulty breathing when she does wake up through the night  Additionally, she works until 9 PM most days and smokes to relieve stress, often at night.  She has tried to quit but due to her underlying anxiety has been unsuccessful with this    PERTINENT  PMH / PSH: Tobacco use, anxiety, depression  OBJECTIVE:   BP 138/82   Pulse 70   Ht 5\' 7"  (1.702 m)   Wt 242 lb 12.8 oz (110.1 kg)   LMP 05/12/2016   SpO2 99%   BMI 38.03 kg/m    General: NAD, pleasant, able to participate in exam Respiratory: No respiratory distress Skin: warm and dry, no rashes noted Psych: Normal affect and mood  ASSESSMENT/PLAN:   Assessment & Plan Anxiety I feel that her insomnia is more so related to anxiety given her symptoms of racing thoughts that prevent her from falling back asleep.  Her overall anxiety levels seem to be high during the day as well.  Feel that she would benefit from better overall control of her anxiety.  Increase Cymbalta to 60 mg, can take this in the evenings due to its drowsiness effects as well.  Additionally, encouraged to avoid nicotine products in the evenings as this could be keeping her awake as well.  Follow-up in about 4 weeks   Vonna Drafts, MD [facility]

## 2022-11-12 NOTE — Patient Instructions (Signed)
I have increased your dose of duloxetine (cymbalta) to 60mg  every evening to help with anxiety and sleep  Please avoid smoking in the evenings

## 2022-11-30 DIAGNOSIS — M961 Postlaminectomy syndrome, not elsewhere classified: Secondary | ICD-10-CM | POA: Diagnosis not present

## 2022-11-30 DIAGNOSIS — M5416 Radiculopathy, lumbar region: Secondary | ICD-10-CM | POA: Diagnosis not present

## 2022-12-03 ENCOUNTER — Ambulatory Visit: Payer: Medicare Other | Admitting: Family Medicine

## 2022-12-04 ENCOUNTER — Other Ambulatory Visit: Payer: Self-pay | Admitting: Physical Medicine and Rehabilitation

## 2022-12-04 DIAGNOSIS — M5416 Radiculopathy, lumbar region: Secondary | ICD-10-CM

## 2022-12-10 NOTE — Discharge Instructions (Signed)

## 2022-12-11 ENCOUNTER — Ambulatory Visit
Admission: RE | Admit: 2022-12-11 | Discharge: 2022-12-11 | Disposition: A | Discharge status: Home / Self Care | Payer: Medicare Other | Source: Ambulatory Visit | Attending: Physical Medicine and Rehabilitation | Admitting: Physical Medicine and Rehabilitation

## 2022-12-11 ENCOUNTER — Inpatient Hospital Stay
Admission: RE | Admit: 2022-12-11 | Discharge: 2022-12-11 | Disposition: A | Discharge status: Home / Self Care | Payer: Medicare Other | Source: Ambulatory Visit | Attending: Physical Medicine and Rehabilitation | Admitting: Physical Medicine and Rehabilitation

## 2022-12-11 DIAGNOSIS — M5117 Intervertebral disc disorders with radiculopathy, lumbosacral region: Secondary | ICD-10-CM | POA: Diagnosis not present

## 2022-12-11 DIAGNOSIS — Z9682 Presence of neurostimulator: Secondary | ICD-10-CM | POA: Diagnosis not present

## 2022-12-11 DIAGNOSIS — M4726 Other spondylosis with radiculopathy, lumbar region: Secondary | ICD-10-CM | POA: Diagnosis not present

## 2022-12-11 DIAGNOSIS — M5416 Radiculopathy, lumbar region: Secondary | ICD-10-CM

## 2022-12-11 DIAGNOSIS — M48061 Spinal stenosis, lumbar region without neurogenic claudication: Secondary | ICD-10-CM | POA: Diagnosis not present

## 2022-12-11 DIAGNOSIS — M4316 Spondylolisthesis, lumbar region: Secondary | ICD-10-CM | POA: Diagnosis not present

## 2022-12-11 DIAGNOSIS — M4727 Other spondylosis with radiculopathy, lumbosacral region: Secondary | ICD-10-CM | POA: Diagnosis not present

## 2022-12-11 DIAGNOSIS — M5116 Intervertebral disc disorders with radiculopathy, lumbar region: Secondary | ICD-10-CM | POA: Diagnosis not present

## 2022-12-11 MED ORDER — MEPERIDINE HCL 50 MG/ML IJ SOLN
50.0000 mg | Freq: Once | INTRAMUSCULAR | Status: DC | PRN
Start: 2022-12-11 — End: 2022-12-12

## 2022-12-11 MED ORDER — ONDANSETRON HCL 4 MG/2ML IJ SOLN: 4.0000 mg | Freq: Once | INTRAMUSCULAR | Status: DC | PRN

## 2022-12-11 MED ORDER — IOPAMIDOL (ISOVUE-M 200) INJECTION 41%
20.0000 mL | Freq: Once | INTRAMUSCULAR | Status: AC
  Administered 2022-12-11: 20 mL via INTRATHECAL

## 2022-12-11 MED ORDER — DIAZEPAM 5 MG PO TABS
10.0000 mg | ORAL_TABLET | Freq: Once | ORAL | Status: AC
  Administered 2022-12-11: 10 mg via ORAL

## 2022-12-14 DIAGNOSIS — G894 Chronic pain syndrome: Secondary | ICD-10-CM | POA: Diagnosis not present

## 2022-12-14 DIAGNOSIS — M5416 Radiculopathy, lumbar region: Secondary | ICD-10-CM | POA: Diagnosis not present

## 2022-12-15 ENCOUNTER — Encounter: Payer: Self-pay | Admitting: Family Medicine

## 2022-12-16 ENCOUNTER — Ambulatory Visit: Payer: Medicare Other | Admitting: Family Medicine

## 2022-12-19 NOTE — Progress Notes (Signed)
    SUBJECTIVE:   CHIEF COMPLAINT / HPI:   Follow-up of anxiety, at last visit 10/1 her Cymbalta was increased to 60 mg and she was advised to take this at night due to concurrent insomnia.  She was also advised to avoid nicotine products in the evenings.  Continues to have trouble sleeping Falls asleep okay but awakens through the night 2-3 times. Takes 1-2hrs to fall back asleep.  Does not feel that she is having racing thoughts or nightmares causing her to wake up Xanax previously worked very well but since she was taken off of this she has not been able to sleep at all Denies hot flashes  Was supposed to have a home sleep study but was unable to afford this. Wanting to do a sleep study in [place] instead.   PERTINENT  PMH / PSH: insomnia, depression, anxiety  OBJECTIVE:   BP 122/72   Pulse 82   Ht 5\' 7"  (1.702 m)   Wt 243 lb (110.2 kg)   LMP 05/12/2016   SpO2 99%   BMI 38.06 kg/m    General: NAD, pleasant, able to participate in exam Cardiac: RRR, no murmurs auscultated Respiratory: CTAB, normal WOB Abdomen: soft, non-tender, non-distended, normoactive bowel sounds Extremities: warm and well perfused, no edema or cyanosis Skin: warm and dry, no rashes noted Neuro: alert, no obvious focal deficits, speech normal  ASSESSMENT/PLAN:   Assessment & Plan Insomnia, unspecified type Ordered sleep study to be performed at [facility]. Suspect her symptoms may be related to worsening OSA/OHS rather than just anxiety. Additionally, given her current medication regimen including abilify and cymbalta, feel she should be evaluated by psychiatry for further medical management of her mental health. In the meantime to bridge until she is able to get her sleep study and see psychiatry, prescribed xanax 0.25mg  at bedtime prn. Discussed risks/benefits of this including risk of respiratory depression if she is also on narcotics. Discussed that this should not be a long term medication.   -referred to psych -xanax 0.25mg  at bedtime prn -ordered sleep study   Vonna Drafts, MD [facility]

## 2022-12-20 ENCOUNTER — Ambulatory Visit (INDEPENDENT_AMBULATORY_CARE_PROVIDER_SITE_OTHER): Payer: Medicare Other | Admitting: Family Medicine

## 2022-12-20 VITALS — BP 122/72 | HR 82 | Ht 67.0 in | Wt 243.0 lb

## 2022-12-20 DIAGNOSIS — F419 Anxiety disorder, unspecified: Secondary | ICD-10-CM

## 2022-12-20 DIAGNOSIS — G4700 Insomnia, unspecified: Secondary | ICD-10-CM | POA: Diagnosis not present

## 2022-12-20 DIAGNOSIS — F339 Major depressive disorder, recurrent, unspecified: Secondary | ICD-10-CM | POA: Diagnosis not present

## 2022-12-20 MED ORDER — ALPRAZOLAM 0.25 MG PO TABS: 0.2500 mg | ORAL_TABLET | Freq: Every evening | ORAL | 0 refills | Status: DC | PRN

## 2022-12-20 NOTE — Patient Instructions (Signed)
You should hear back about setting up your psychiatry appointment and sleep study within the next 1-2 weeks  Please try to stop smoking in the evenings  Please only use xanax as needed for sleep

## 2022-12-20 NOTE — Assessment & Plan Note (Signed)
Ordered sleep study to be performed at [facility]. Suspect her symptoms may be related to worsening OSA/OHS rather than just anxiety. Additionally, given her current medication regimen including abilify and cymbalta, feel she should be evaluated by psychiatry for further medical management of her mental health. In the meantime to bridge until she is able to get her sleep study and see psychiatry, prescribed xanax 0.25mg  at bedtime prn. Discussed risks/benefits of this including risk of respiratory depression if she is also on narcotics. Discussed that this should not be a long term medication.  -referred to psych -xanax 0.25mg  at bedtime prn -ordered sleep study

## 2022-12-21 ENCOUNTER — Encounter: Payer: Self-pay | Admitting: Family Medicine

## 2023-01-08 ENCOUNTER — Other Ambulatory Visit: Payer: Self-pay | Admitting: Family Medicine

## 2023-01-11 ENCOUNTER — Other Ambulatory Visit: Payer: Self-pay | Admitting: Family Medicine

## 2023-01-11 DIAGNOSIS — F419 Anxiety disorder, unspecified: Secondary | ICD-10-CM

## 2023-01-11 DIAGNOSIS — G4700 Insomnia, unspecified: Secondary | ICD-10-CM

## 2023-01-14 DIAGNOSIS — Q7649 Other congenital malformations of spine, not associated with scoliosis: Secondary | ICD-10-CM | POA: Diagnosis not present

## 2023-01-14 DIAGNOSIS — Z6836 Body mass index (BMI) 36.0-36.9, adult: Secondary | ICD-10-CM | POA: Diagnosis not present

## 2023-01-18 ENCOUNTER — Encounter: Payer: Self-pay | Admitting: Family Medicine

## 2023-01-18 DIAGNOSIS — N644 Mastodynia: Secondary | ICD-10-CM

## 2023-01-20 ENCOUNTER — Encounter: Payer: Self-pay | Admitting: Family Medicine

## 2023-01-20 ENCOUNTER — Other Ambulatory Visit: Payer: Self-pay | Admitting: Family Medicine

## 2023-01-20 DIAGNOSIS — N644 Mastodynia: Secondary | ICD-10-CM

## 2023-01-22 ENCOUNTER — Encounter: Payer: Self-pay | Admitting: Family Medicine

## 2023-01-23 ENCOUNTER — Other Ambulatory Visit: Payer: Medicare Other

## 2023-01-24 MED ORDER — WEGOVY 0.25 MG/0.5ML ~~LOC~~ SOAJ: 0.2500 mg | SUBCUTANEOUS | 1 refills | Status: DC

## 2023-01-24 NOTE — Telephone Encounter (Signed)
Ordered wegovy 0.25mg  weekly for obesity and cardiovascular risk reduction  Pt has attempted lifestyle changes including diet, exercise without improvement. Has gained weight over the past year that she has been trying these measures. No documented hx pancreatitis or thyroid cancer  Pt has medicaid secondary coverage  Vonna Drafts, MD

## 2023-01-27 ENCOUNTER — Other Ambulatory Visit: Payer: Self-pay | Admitting: Family Medicine

## 2023-01-27 DIAGNOSIS — G4700 Insomnia, unspecified: Secondary | ICD-10-CM

## 2023-01-27 DIAGNOSIS — F419 Anxiety disorder, unspecified: Secondary | ICD-10-CM

## 2023-01-28 ENCOUNTER — Other Ambulatory Visit (HOSPITAL_COMMUNITY): Payer: Self-pay

## 2023-01-28 MED ORDER — WEGOVY 0.25 MG/0.5ML ~~LOC~~ SOAJ
0.2500 mg | SUBCUTANEOUS | 1 refills | Status: DC
  Filled 2023-01-28 – 2023-04-01 (×3): qty 2, 28d supply, fill #0

## 2023-01-28 NOTE — Addendum Note (Signed)
Addended by: Steva Colder on: 01/28/2023 09:58 AM   Modules accepted: Orders

## 2023-01-30 ENCOUNTER — Other Ambulatory Visit (HOSPITAL_COMMUNITY): Payer: Self-pay

## 2023-01-30 ENCOUNTER — Telehealth: Payer: Self-pay

## 2023-01-30 NOTE — Telephone Encounter (Signed)
Rec'd PA request for Wegovy  Pt has 2 insurances;  [employer] ([place]) and [employer] (medicare part D).   [employer] requires PA for [facility] but unable to submit via covermymeds. Key: [id number].  Will call insurance.

## 2023-02-03 ENCOUNTER — Other Ambulatory Visit (HOSPITAL_COMMUNITY): Payer: Self-pay

## 2023-02-11 ENCOUNTER — Other Ambulatory Visit (HOSPITAL_COMMUNITY): Payer: Self-pay

## 2023-02-11 NOTE — Telephone Encounter (Signed)
 Insurance has last name as [employer].   Pharmacy Patient Advocate Encounter   Received notification from CoverMyMeds that prior authorization for wegovy  0.25mg  is required/requested.  PA required; PA submitted to above mentioned insurance via CoverMyMeds Key/confirmation #/EOC [id number]. Status is pending

## 2023-02-11 NOTE — Telephone Encounter (Signed)
Pharmacy Patient Advocate Encounter  Received notification from [facility] that Prior Authorization for [facility] 0.25MG  has been APPROVED from 02/11/23 to 08/11/23   PA #/Case ID/Reference #: [id number]

## 2023-02-13 ENCOUNTER — Other Ambulatory Visit: Payer: Self-pay | Admitting: Obstetrics & Gynecology

## 2023-02-19 ENCOUNTER — Ambulatory Visit
Admission: RE | Admit: 2023-02-19 | Discharge: 2023-02-19 | Disposition: A | Discharge status: Home / Self Care | Payer: Medicare Other | Source: Ambulatory Visit | Attending: Family Medicine | Admitting: Family Medicine

## 2023-02-19 ENCOUNTER — Ambulatory Visit: Admission: RE | Admit: 2023-02-19 | Payer: Medicare Other | Source: Ambulatory Visit

## 2023-02-19 DIAGNOSIS — N644 Mastodynia: Secondary | ICD-10-CM

## 2023-02-19 DIAGNOSIS — Z4789 Encounter for other orthopedic aftercare: Secondary | ICD-10-CM | POA: Diagnosis not present

## 2023-02-24 ENCOUNTER — Other Ambulatory Visit (HOSPITAL_BASED_OUTPATIENT_CLINIC_OR_DEPARTMENT_OTHER): Payer: Self-pay | Admitting: Pulmonary Disease

## 2023-02-27 ENCOUNTER — Encounter: Payer: Self-pay | Admitting: Family Medicine

## 2023-03-07 ENCOUNTER — Encounter: Payer: Self-pay | Admitting: Family Medicine

## 2023-03-10 ENCOUNTER — Ambulatory Visit: Payer: Medicare Other | Admitting: Family Medicine

## 2023-03-12 ENCOUNTER — Telehealth: Payer: Self-pay | Admitting: Pulmonary Disease

## 2023-03-12 ENCOUNTER — Other Ambulatory Visit: Payer: Self-pay | Admitting: Family Medicine

## 2023-03-12 DIAGNOSIS — G4700 Insomnia, unspecified: Secondary | ICD-10-CM

## 2023-03-12 DIAGNOSIS — F419 Anxiety disorder, unspecified: Secondary | ICD-10-CM

## 2023-03-12 MED ORDER — BREZTRI AEROSPHERE 160-9-4.8 MCG/ACT IN AERO: 2.0000 | INHALATION_SPRAY | Freq: Two times a day (BID) | RESPIRATORY_TRACT | 3 refills | Status: AC

## 2023-03-12 NOTE — Telephone Encounter (Signed)
Patient would like for [place] to call a refill for her COPD inhaler (she had forgotten the name) to [facility] on [address] in [place] has appointment with Dr. Vassie Loll for 04/18/23 at 8:45 am.  Patient call back is [phone]

## 2023-03-12 NOTE — Telephone Encounter (Signed)
Refilled and the patient is aware.

## 2023-03-14 ENCOUNTER — Ambulatory Visit (INDEPENDENT_AMBULATORY_CARE_PROVIDER_SITE_OTHER): Payer: Medicare Other | Admitting: Family Medicine

## 2023-03-14 ENCOUNTER — Ambulatory Visit (HOSPITAL_COMMUNITY)
Admission: RE | Admit: 2023-03-14 | Discharge: 2023-03-14 | Disposition: A | Discharge status: Home / Self Care | Payer: Medicare Other | Source: Ambulatory Visit | Attending: Family Medicine | Admitting: Family Medicine

## 2023-03-14 VITALS — BP 138/86 | HR 74 | Wt 234.6 lb

## 2023-03-14 DIAGNOSIS — G43009 Migraine without aura, not intractable, without status migrainosus: Secondary | ICD-10-CM

## 2023-03-14 DIAGNOSIS — Z87891 Personal history of nicotine dependence: Secondary | ICD-10-CM | POA: Diagnosis not present

## 2023-03-14 DIAGNOSIS — R634 Abnormal weight loss: Secondary | ICD-10-CM | POA: Insufficient documentation

## 2023-03-14 DIAGNOSIS — R739 Hyperglycemia, unspecified: Secondary | ICD-10-CM | POA: Diagnosis not present

## 2023-03-14 DIAGNOSIS — R059 Cough, unspecified: Secondary | ICD-10-CM | POA: Diagnosis not present

## 2023-03-14 LAB — POCT GLYCOSYLATED HEMOGLOBIN (HGB A1C): Hemoglobin A1C: 5.6 % (ref 4.0–5.6)

## 2023-03-14 MED ORDER — MAGNESIUM OXIDE 400 MG PO CAPS: 400.0000 mg | ORAL_CAPSULE | Freq: Every day | ORAL | 0 refills | Status: DC | PRN

## 2023-03-14 NOTE — Patient Instructions (Addendum)
I will let you know if any results are abnormal  Try magnesium oxide for headaches  You should get a call about GI appointment  Please go to [facility] for your x ray  [facility]: [phone]

## 2023-03-14 NOTE — Assessment & Plan Note (Signed)
Recent history of worsening migraines.  She typically uses Tylenol or Excedrin.  No red flag symptoms.  Refilled magnesium oxide for her to try taking this.  I am hesitant to start medications like TCA or Topamax or beta-blocker given her history of weight loss recently, on multiple psychiatric medications, and history of COPD on inhalers.  Consider neurology referral in the future although she reports she has seen multiple neurologist in the past

## 2023-03-14 NOTE — Assessment & Plan Note (Signed)
Has not been able to start [facility] yet.  See above, continue to hold off until able to see GI.  She is not diabetic

## 2023-03-14 NOTE — Progress Notes (Signed)
    SUBJECTIVE:   CHIEF COMPLAINT / HPI:   Hungry all the time - now eating multiple times per day instead of her typical once per day Concerned about thyroid Mom and daughter has thyroid disease Frequently has hot flashes - worse over past month On estradiol - s/p hysterectomy Has lost almost 10 lbs since November Has been smoking for about 30 years - 1ppd on average No hemoptysis or rectal bleeding  Migraines Seen for this in September, advised to add magnesium oxide to tylenol for headaches but she did not try this Over the past month, has had migraines almost daily Often has nausea, photophobia Usually uses tylenol or excedrin Interested in prophylactic medicine No numbness, no weakness in extremities, abdominal pain, fevers, shortness of breath, chest pain  Interested in weight loss Reginal Lutes was approved per chart review Attempted weight loss via intermittent fasting and exercise for greater than 60 minutes per week  Didn't take amlodipine last night   PERTINENT  PMH / PSH: Obesity, migraines, hypertension  OBJECTIVE:   BP 138/86   Pulse 74   Wt 234 lb 9.6 oz (106.4 kg)   LMP 05/12/2016   SpO2 100%   BMI 36.74 kg/m    General: NAD, pleasant, able to participate in exam Cardiac: RRR, no murmurs auscultated Respiratory: CTAB, normal WOB Abdomen: soft, non-tender, non-distended, normoactive bowel sounds Extremities: warm and well perfused, no edema or cyanosis Skin: warm and dry, no rashes noted Neuro: alert, no obvious focal deficits, speech normal.  PERRLA EOMI Psych: Normal affect and mood  ASSESSMENT/PLAN:   Assessment & Plan Unintended weight loss Recent history of nearly 10 pound weight loss since November.  However, patient reports that she has been eating more than usual over this time and is unsure why she is losing this weight.  Denies any symptoms of GI bleeding and had colonoscopy in 2019 without evidence of cancer.  Status post hysterectomy,  pathology negative for malignancy, she no longer has a cervix.  Given her concerns placed referral to GI for further evaluation.  Advised patient not to pick up [facility] until she is able to see them so that additional weight loss is not masked by ZOXWRU.  Will also obtain chest x-ray given her smoking history.  Also check TSH, CBC, CMP Morbid obesity (HCC) Has not been able to start [facility] yet.  See above, continue to hold off until able to see GI.  She is not diabetic Migraine without aura and without status migrainosus, not intractable Recent history of worsening migraines.  She typically uses Tylenol or Excedrin.  No red flag symptoms.  Refilled magnesium oxide for her to try taking this.  I am hesitant to start medications like TCA or Topamax or beta-blocker given her history of weight loss recently, on multiple psychiatric medications, and history of COPD on inhalers.  Consider neurology referral in the future although she reports she has seen multiple neurologist in the past   Vonna Drafts, MD [facility]

## 2023-03-15 LAB — COMPREHENSIVE METABOLIC PANEL
ALT: 17 [IU]/L (ref 0–32)
AST: 17 [IU]/L (ref 0–40)
Albumin: 4.6 g/dL (ref 3.9–4.9)
Alkaline Phosphatase: 87 [IU]/L (ref 44–121)
BUN/Creatinine Ratio: 9 (ref 9–23)
BUN: 7 mg/dL (ref 6–24)
Bilirubin Total: <0.2 mg/dL (ref 0.0–1.2)
CO2: 25 mmol/L (ref 20–29)
Calcium: 9.7 mg/dL (ref 8.7–10.2)
Chloride: 102 mmol/L (ref 96–106)
Creatinine, Ser: 0.81 mg/dL (ref 0.57–1.00)
Globulin, Total: 2.4 g/dL (ref 1.5–4.5)
Glucose: 78 mg/dL (ref 70–99)
Potassium: 4 mmol/L (ref 3.5–5.2)
Sodium: 143 mmol/L (ref 134–144)
Total Protein: 7 g/dL (ref 6.0–8.5)
eGFR: 90 mL/min/{1.73_m2} (ref >59)

## 2023-03-15 LAB — CBC WITH DIFFERENTIAL/PLATELET
Basophils Absolute: 0.1 10*3/uL (ref 0.0–0.2)
Basos: 1 %
EOS (ABSOLUTE): 0.6 10*3/uL — ABNORMAL HIGH (ref 0.0–0.4)
Eos: 5 %
Hematocrit: 43.4 % (ref 34.0–46.6)
Hemoglobin: 14.4 g/dL (ref 11.1–15.9)
Immature Grans (Abs): 0 10*3/uL (ref 0.0–0.1)
Immature Granulocytes: 0 %
Lymphocytes Absolute: 3.1 10*3/uL (ref 0.7–3.1)
Lymphs: 26 %
MCH: 28.3 pg (ref 26.6–33.0)
MCHC: 33.2 g/dL (ref 31.5–35.7)
MCV: 85 fL (ref 79–97)
Monocytes Absolute: 0.6 10*3/uL (ref 0.1–0.9)
Monocytes: 5 %
Neutrophils Absolute: 7.6 10*3/uL — ABNORMAL HIGH (ref 1.4–7.0)
Neutrophils: 63 %
Platelets: 354 10*3/uL (ref 150–450)
RBC: 5.09 x10E6/uL (ref 3.77–5.28)
RDW: 12.7 % (ref 11.7–15.4)
WBC: 11.9 10*3/uL — ABNORMAL HIGH (ref 3.4–10.8)

## 2023-03-15 LAB — TSH RFX ON ABNORMAL TO FREE T4: TSH: 2.12 u[IU]/mL (ref 0.450–4.500)

## 2023-03-16 ENCOUNTER — Encounter: Payer: Self-pay | Admitting: Family Medicine

## 2023-03-18 ENCOUNTER — Encounter: Payer: Self-pay | Admitting: Family Medicine

## 2023-03-18 DIAGNOSIS — R634 Abnormal weight loss: Secondary | ICD-10-CM
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
## 2023-03-25 ENCOUNTER — Encounter: Payer: Self-pay | Admitting: Family Medicine

## 2023-03-25 ENCOUNTER — Encounter: Payer: Self-pay | Admitting: *Deleted

## 2023-03-25 ENCOUNTER — Ambulatory Visit (INDEPENDENT_AMBULATORY_CARE_PROVIDER_SITE_OTHER): Payer: Medicare Other | Admitting: Gastroenterology

## 2023-03-25 ENCOUNTER — Encounter: Payer: Self-pay | Admitting: Gastroenterology

## 2023-03-25 VITALS — BP 127/73 | HR 67 | Temp 98.4°F | Ht 69.0 in | Wt 232.6 lb

## 2023-03-25 DIAGNOSIS — K5900 Constipation, unspecified: Secondary | ICD-10-CM | POA: Diagnosis not present

## 2023-03-25 DIAGNOSIS — M25551 Pain in right hip: Secondary | ICD-10-CM | POA: Diagnosis not present

## 2023-03-25 DIAGNOSIS — K5903 Drug induced constipation: Secondary | ICD-10-CM

## 2023-03-25 DIAGNOSIS — K219 Gastro-esophageal reflux disease without esophagitis: Secondary | ICD-10-CM | POA: Diagnosis not present

## 2023-03-25 DIAGNOSIS — R1319 Other dysphagia: Secondary | ICD-10-CM

## 2023-03-25 DIAGNOSIS — G894 Chronic pain syndrome: Secondary | ICD-10-CM | POA: Diagnosis not present

## 2023-03-25 DIAGNOSIS — R1310 Dysphagia, unspecified: Secondary | ICD-10-CM | POA: Diagnosis not present

## 2023-03-25 DIAGNOSIS — G8929 Other chronic pain: Secondary | ICD-10-CM | POA: Diagnosis not present

## 2023-03-25 DIAGNOSIS — R634 Abnormal weight loss: Secondary | ICD-10-CM | POA: Insufficient documentation

## 2023-03-25 MED ORDER — ESOMEPRAZOLE MAGNESIUM 40 MG PO CPDR: 40.0000 mg | DELAYED_RELEASE_CAPSULE | Freq: Every day | ORAL | 1 refills | Status: AC

## 2023-03-25 MED ORDER — LUBIPROSTONE 24 MCG PO CAPS: 24.0000 ug | ORAL_CAPSULE | Freq: Two times a day (BID) | ORAL | 1 refills | Status: DC

## 2023-03-25 NOTE — Patient Instructions (Signed)
Stop pantoprazole. Start esomeprazole 40mg  daily before breakfast. Start lubiprostone 24 mcg twice daily with food for constipation. Upper endoscopy to be scheduled.  See separate instructions. Hold off on starting Wegovy at least for 4 weeks to see if you have additional weight loss. I have called the radiology read room asking them to go ahead and read your chest x-ray.  Please follow-up with your primary care for results of this.  You may need CT imaging of your lungs for adequate lung cancer screening. Please monitor for further rectal bleeding, if you continue to see blood in the stool especially if stools are easier to pass and you are not straining, then you need to update your colonoscopy.  We can find alternative bowel preps for you should be easier to take.

## 2023-03-27 ENCOUNTER — Encounter (HOSPITAL_COMMUNITY): Payer: Self-pay

## 2023-03-27 ENCOUNTER — Other Ambulatory Visit: Payer: Self-pay

## 2023-03-27 NOTE — Pre-Procedure Instructions (Signed)
Completed PAT visit with patient over the phone.

## 2023-03-28 ENCOUNTER — Encounter (HOSPITAL_COMMUNITY)
Admission: RE | Admit: 2023-03-28 | Discharge: 2023-03-28 | Disposition: A | Discharge status: Home / Self Care | Payer: Medicare Other | Source: Ambulatory Visit | Attending: Internal Medicine | Admitting: Internal Medicine

## 2023-04-01 ENCOUNTER — Other Ambulatory Visit (HOSPITAL_COMMUNITY): Payer: Self-pay

## 2023-04-02 ENCOUNTER — Encounter (HOSPITAL_COMMUNITY): Payer: Self-pay | Admitting: Internal Medicine

## 2023-04-02 ENCOUNTER — Other Ambulatory Visit: Payer: Self-pay

## 2023-04-02 ENCOUNTER — Ambulatory Visit (HOSPITAL_COMMUNITY): Payer: Medicare Other | Admitting: Anesthesiology

## 2023-04-02 ENCOUNTER — Ambulatory Visit (HOSPITAL_COMMUNITY)
Admission: RE | Admit: 2023-04-02 | Discharge: 2023-04-02 | Disposition: A | Discharge status: Home / Self Care | Payer: Medicare Other | Attending: Internal Medicine | Admitting: Internal Medicine

## 2023-04-02 ENCOUNTER — Encounter (HOSPITAL_COMMUNITY)
Admission: RE | Disposition: A | Discharge status: Home / Self Care | Payer: Self-pay | Source: Home / Self Care | Attending: Internal Medicine

## 2023-04-02 DIAGNOSIS — R634 Abnormal weight loss: Secondary | ICD-10-CM | POA: Insufficient documentation

## 2023-04-02 DIAGNOSIS — F17210 Nicotine dependence, cigarettes, uncomplicated: Secondary | ICD-10-CM | POA: Diagnosis not present

## 2023-04-02 DIAGNOSIS — K2100 Gastro-esophageal reflux disease with esophagitis, without bleeding: Secondary | ICD-10-CM | POA: Insufficient documentation

## 2023-04-02 DIAGNOSIS — J45909 Unspecified asthma, uncomplicated: Secondary | ICD-10-CM | POA: Diagnosis not present

## 2023-04-02 DIAGNOSIS — J449 Chronic obstructive pulmonary disease, unspecified: Secondary | ICD-10-CM | POA: Diagnosis not present

## 2023-04-02 DIAGNOSIS — G4730 Sleep apnea, unspecified: Secondary | ICD-10-CM | POA: Diagnosis not present

## 2023-04-02 DIAGNOSIS — R1310 Dysphagia, unspecified: Secondary | ICD-10-CM | POA: Insufficient documentation

## 2023-04-02 DIAGNOSIS — F319 Bipolar disorder, unspecified: Secondary | ICD-10-CM | POA: Diagnosis not present

## 2023-04-02 DIAGNOSIS — F419 Anxiety disorder, unspecified: Secondary | ICD-10-CM | POA: Insufficient documentation

## 2023-04-02 DIAGNOSIS — K449 Diaphragmatic hernia without obstruction or gangrene: Secondary | ICD-10-CM | POA: Insufficient documentation

## 2023-04-02 DIAGNOSIS — Z8719 Personal history of other diseases of the digestive system: Secondary | ICD-10-CM | POA: Insufficient documentation

## 2023-04-02 DIAGNOSIS — J4489 Other specified chronic obstructive pulmonary disease: Secondary | ICD-10-CM | POA: Insufficient documentation

## 2023-04-02 DIAGNOSIS — Z6834 Body mass index (BMI) 34.0-34.9, adult: Secondary | ICD-10-CM | POA: Diagnosis not present

## 2023-04-02 DIAGNOSIS — Z5986 Financial insecurity: Secondary | ICD-10-CM | POA: Insufficient documentation

## 2023-04-02 DIAGNOSIS — Z5941 Food insecurity: Secondary | ICD-10-CM | POA: Diagnosis not present

## 2023-04-02 DIAGNOSIS — I129 Hypertensive chronic kidney disease with stage 1 through stage 4 chronic kidney disease, or unspecified chronic kidney disease: Secondary | ICD-10-CM | POA: Diagnosis not present

## 2023-04-02 DIAGNOSIS — Z79899 Other long term (current) drug therapy: Secondary | ICD-10-CM | POA: Insufficient documentation

## 2023-04-02 DIAGNOSIS — N189 Chronic kidney disease, unspecified: Secondary | ICD-10-CM | POA: Insufficient documentation

## 2023-04-02 DIAGNOSIS — I10 Essential (primary) hypertension: Secondary | ICD-10-CM | POA: Diagnosis not present

## 2023-04-02 HISTORY — PX: MALONEY DILATION: SHX5535

## 2023-04-02 HISTORY — PX: ESOPHAGOGASTRODUODENOSCOPY (EGD) WITH PROPOFOL: SHX5813

## 2023-04-02 SURGERY — ESOPHAGOGASTRODUODENOSCOPY (EGD) WITH PROPOFOL
Anesthesia: General

## 2023-04-02 MED ORDER — LACTATED RINGERS IV SOLN: INTRAVENOUS | Status: DC

## 2023-04-02 MED ORDER — LACTATED RINGERS IV SOLN: INTRAVENOUS | Status: DC | PRN

## 2023-04-02 MED ORDER — LIDOCAINE HCL (CARDIAC) PF 100 MG/5ML IV SOSY
PREFILLED_SYRINGE | INTRAVENOUS | Status: DC | PRN
  Administered 2023-04-02: 60 mg via INTRAVENOUS

## 2023-04-02 MED ORDER — PROPOFOL 10 MG/ML IV BOLUS
INTRAVENOUS | Status: DC | PRN
  Administered 2023-04-02: 100 mg via INTRAVENOUS
  Administered 2023-04-02: 200 ug/kg/min via INTRAVENOUS

## 2023-04-02 NOTE — Op Note (Signed)
[facility] Patient Name: Shelby [name] Procedure Date: 04/02/2023 9:15 AM MRN: [id number] Date of Birth: [birth year] Attending MD: Gennette Pac , MD, [id number] CSN: [id number] Age: 48 Admit Type: Outpatient Procedure:                Upper GI endoscopy Indications:              Dysphagia Providers:                Gennette Pac, MD, Sheran Fava, Francoise Ceo RN, RN, Elinor Parkinson Referring MD:              Medicines:                Propofol per Anesthesia Complications:            No immediate complications. Estimated Blood Loss:     Estimated blood loss was minimal. Procedure:                Pre-Anesthesia Assessment:                           - Prior to the procedure, a History and Physical                            was performed, and patient medications and                            allergies were reviewed. The patient's tolerance of                            previous anesthesia was also reviewed. The risks                            and benefits of the procedure and the sedation                            options and risks were discussed with the patient.                            All questions were answered, and informed consent                            was obtained. Prior Anticoagulants: The patient has                            taken no anticoagulant or antiplatelet agents. ASA                            Grade Assessment: III - A patient with severe                            systemic disease. After reviewing the risks and  benefits, the patient was deemed in satisfactory                            condition to undergo the procedure.                           After obtaining informed consent, the endoscope was                            passed under direct vision. Throughout the                            procedure, the patient's blood pressure, pulse, and                            oxygen  saturations were monitored continuously. The                            [id number]-[id number] ([id number]) scope was introduced through the                            mouth, and advanced to the second part of duodenum.                            The upper GI endoscopy was accomplished without                            difficulty. The patient tolerated the procedure                            well. Scope In: 9:43:08 AM Scope Out: 9:48:04 AM Total Procedure Duration: 0 hours 4 minutes 56 seconds  Findings:      The examined esophagus was normal.      A small hiatal hernia was present. Gastric mucosa appeared normal.      The duodenal bulb and second portion of the duodenum were normal. The       scope was withdrawn. Dilation was performed with a Maloney dilator with       no resistance at 54 Fr. Dilation was performed with a Maloney dilator       with no resistance at 56 Fr. The dilation site was examined following       endoscope reinsertion and showed no change. Estimated blood loss was       minimal. Impression:               - Normal esophagus. Dilated.                           - Small hiatal hernia.                           - Normal duodenal bulb and second portion of the                            duodenum.                           -  No specimens collected. Moderate Sedation:      Moderate (conscious) sedation was personally administered by an       anesthesia professional. The following parameters were monitored: oxygen       saturation, heart rate, blood pressure, respiratory rate, EKG, adequacy       of pulmonary ventilation, and response to care. Recommendation:           - Patient has a contact number available for                            emergencies. The signs and symptoms of potential                            delayed complications were discussed with the                            patient. Return to normal activities tomorrow.                            Written discharge  instructions were provided to the                            patient.                           - Advance diet as tolerated.                           - Continue present medications. Office visit in 6                            weeks Procedure Code(s):        --- Professional ---                           [phone], Esophagogastroduodenoscopy, flexible,                            transoral; diagnostic, including collection of                            specimen(s) by brushing or washing, when performed                            (separate procedure)                           43450, Dilation of esophagus, by unguided sound or                            bougie, single or multiple passes Diagnosis Code(s):        --- Professional ---                           K44.9, Diaphragmatic hernia without obstruction or  gangrene                           R13.10, Dysphagia, unspecified CPT copyright 2022 American Medical Association. All rights reserved. The codes documented in this report are preliminary and upon coder review may  be revised to meet current compliance requirements. Gerrit Friends. Jami Bogdanski, MD Gennette Pac, MD 04/02/2023 10:05:50 AM This report has been signed electronically. Number of Addenda: 0

## 2023-04-02 NOTE — Transfer of Care (Signed)
Immediate Anesthesia Transfer of Care Note  Patient: Shelby [name]  Procedure(s) Performed: ESOPHAGOGASTRODUODENOSCOPY (EGD) WITH PROPOFOL MALONEY DILATION  Patient Location: Short Stay  Anesthesia Type:General  Level of Consciousness: awake  Airway & Oxygen Therapy: Patient Spontanous Breathing  Post-op Assessment: Report given to RN and Post -op Vital signs reviewed and stable  Post vital signs: Reviewed and stable  Last Vitals:  Vitals Value Taken Time  BP 115/83 04/02/23 0957  Temp 37 C 04/02/23 0957  Pulse 73 04/02/23 0957  Resp 16 04/02/23 0957  SpO2 95 % 04/02/23 0957    Last Pain:  Vitals:   04/02/23 0957  TempSrc: Oral  PainSc: 0-No pain      Patients Stated Pain Goal: 3 (04/02/23 0757)  Complications: No notable events documented.

## 2023-04-02 NOTE — Anesthesia Preprocedure Evaluation (Signed)
Anesthesia Evaluation  Patient identified by MRN, date of birth, ID band Patient awake    Reviewed: Allergy & Precautions, H&P , NPO status , Patient's Chart, lab work & pertinent test results, reviewed documented beta blocker date and time   Airway Mallampati: II  TM Distance: >3 FB Neck ROM: full    Dental no notable dental hx.    Pulmonary shortness of breath, asthma , sleep apnea , pneumonia, COPD, Current Smoker   Pulmonary exam normal breath sounds clear to auscultation       Cardiovascular Exercise Tolerance: Good hypertension, + Valvular Problems/Murmurs  Rhythm:regular Rate:Normal     Neuro/Psych  Headaches PSYCHIATRIC DISORDERS Anxiety Depression Bipolar Disorder    Neuromuscular disease    GI/Hepatic Neg liver ROS,GERD  ,,  Endo/Other  negative endocrine ROS    Renal/GU Renal disease  negative genitourinary   Musculoskeletal   Abdominal   Peds  Hematology  (+) Blood dyscrasia, anemia   Anesthesia Other Findings   Reproductive/Obstetrics negative OB ROS                             Anesthesia Physical Anesthesia Plan  ASA: 3  Anesthesia Plan: General   Post-op Pain Management:    Induction:   PONV Risk Score and Plan: Propofol infusion  Airway Management Planned:   Additional Equipment:   Intra-op Plan:   Post-operative Plan:   Informed Consent: I have reviewed the patients History and Physical, chart, labs and discussed the procedure including the risks, benefits and alternatives for the proposed anesthesia with the patient or authorized representative who has indicated his/her understanding and acceptance.     Dental Advisory Given  Plan Discussed with: CRNA  Anesthesia Plan Comments:        Anesthesia Quick Evaluation

## 2023-04-02 NOTE — Interval H&P Note (Signed)
History and Physical Interval Note:  04/02/2023 9:30 AM  Shelby [name]  has presented today for surgery, with the diagnosis of gerd, dysphagia, wt loss.  The various methods of treatment have been discussed with the patient and family. After consideration of risks, benefits and other options for treatment, the patient has consented to  Procedure(s) with comments: ESOPHAGOGASTRODUODENOSCOPY (EGD) WITH PROPOFOL (N/A) - 11:15am, rm 1/2 okay, pt knows to arrive at 7:00 MALONEY DILATION (N/A) as a surgical intervention.  The patient's history has been reviewed, patient examined, no change in status, stable for surgery.  I have reviewed the patient's chart and labs.  Questions were answered to the patient's satisfaction.     weight loss, dysphagia.  Refractory GERD no change.   EGD with esophageal dilation as feasible/appropriate today per plan.  The risks, benefits, limitations, alternatives and imponderables have been reviewed with the patient. Potential for esophageal dilation, biopsy, etc. have also been reviewed.  Questions have been answered. All parties agreeable.  Eula Listen

## 2023-04-02 NOTE — Discharge Instructions (Signed)
EGD Discharge instructions Please read the instructions outlined below and refer to this sheet in the next few weeks. These discharge instructions provide you with general information on caring for yourself after you leave the hospital. Your doctor may also give you specific instructions. While your treatment has been planned according to the most current medical practices available, unavoidable complications occasionally occur. If you have any problems or questions after discharge, please call your doctor. ACTIVITY You may resume your regular activity but move at a slower pace for the next 24 hours.  Take frequent rest periods for the next 24 hours.  Walking will help expel (get rid of) the air and reduce the bloated feeling in your abdomen.  No driving for 24 hours (because of the anesthesia (medicine) used during the test).  You may shower.  Do not sign any important legal documents or operate any machinery for 24 hours (because of the anesthesia used during the test).  NUTRITION Drink plenty of fluids.  You may resume your normal diet.  Begin with a light meal and progress to your normal diet.  Avoid alcoholic beverages for 24 hours or as instructed by your caregiver.  MEDICATIONS You may resume your normal medications unless your caregiver tells you otherwise.  WHAT YOU CAN EXPECT TODAY You may experience abdominal discomfort such as a feeling of fullness or "gas" pains.  FOLLOW-UP Your doctor will discuss the results of your test with you.  SEEK IMMEDIATE MEDICAL ATTENTION IF ANY OF THE FOLLOWING OCCUR: Excessive nausea (feeling sick to your stomach) and/or vomiting.  Severe abdominal pain and distention (swelling).  Trouble swallowing.  Temperature over 101 F (37.8 C).  Rectal bleeding or vomiting of blood.     Small hiatal hernia found today  Esophagus was   Stretched today  office visit with Tana Coast in 6 weeks  At patient request, called Caiti Apple  [phone] findings recommendations

## 2023-04-03 ENCOUNTER — Encounter (HOSPITAL_COMMUNITY): Payer: Self-pay | Admitting: Internal Medicine

## 2023-04-03 ENCOUNTER — Other Ambulatory Visit: Payer: Self-pay | Admitting: Pulmonary Disease

## 2023-04-04 NOTE — Anesthesia Postprocedure Evaluation (Signed)
Anesthesia Post Note  Patient: AYAANA BIONDO  Procedure(s) Performed: ESOPHAGOGASTRODUODENOSCOPY (EGD) WITH PROPOFOL MALONEY DILATION  Patient location during evaluation: Phase II Anesthesia Type: General Level of consciousness: awake Pain management: pain level controlled Vital Signs Assessment: post-procedure vital signs reviewed and stable Respiratory status: spontaneous breathing and respiratory function stable Cardiovascular status: blood pressure returned to baseline and stable Postop Assessment: no headache and no apparent nausea or vomiting Anesthetic complications: no Comments: Late entry   No notable events documented.   Last Vitals:  Vitals:   04/02/23 0800 04/02/23 0957  BP: 118/80 115/83  Pulse: 64 73  Resp: 18 16  Temp: 36.8 C 37 C  SpO2: 95% 95%    Last Pain:  Vitals:   04/03/23 1451  TempSrc:   PainSc: 0-No pain                 Windell Norfolk

## 2023-04-08 ENCOUNTER — Encounter: Payer: Self-pay | Admitting: Obstetrics & Gynecology

## 2023-04-09 ENCOUNTER — Encounter: Payer: Medicare Other | Admitting: Gastroenterology

## 2023-04-10 ENCOUNTER — Ambulatory Visit (INDEPENDENT_AMBULATORY_CARE_PROVIDER_SITE_OTHER): Payer: Medicare Other | Admitting: Obstetrics & Gynecology

## 2023-04-10 VITALS — BP 120/78 | HR 80 | Ht 68.0 in | Wt 230.0 lb

## 2023-04-10 DIAGNOSIS — R102 Pelvic and perineal pain: Secondary | ICD-10-CM

## 2023-04-10 DIAGNOSIS — R309 Painful micturition, unspecified: Secondary | ICD-10-CM | POA: Diagnosis not present

## 2023-04-10 DIAGNOSIS — K660 Peritoneal adhesions (postprocedural) (postinfection): Secondary | ICD-10-CM | POA: Diagnosis not present

## 2023-04-10 LAB — POCT URINALYSIS DIPSTICK OB
Blood, UA: NEGATIVE
Glucose, UA: NEGATIVE
Ketones, UA: NEGATIVE
Leukocytes, UA: NEGATIVE
Nitrite, UA: NEGATIVE
POC,PROTEIN,UA: NEGATIVE
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
## 2023-04-11 ENCOUNTER — Encounter (HOSPITAL_COMMUNITY): Payer: Self-pay

## 2023-04-11 ENCOUNTER — Ambulatory Visit (HOSPITAL_COMMUNITY): Admission: RE | Admit: 2023-04-11 | Payer: Medicare Other | Source: Ambulatory Visit

## 2023-04-11 ENCOUNTER — Encounter: Payer: Self-pay | Admitting: Obstetrics & Gynecology

## 2023-04-12 LAB — URINE CULTURE

## 2023-04-14 ENCOUNTER — Encounter: Payer: Self-pay | Admitting: Family Medicine

## 2023-04-15 ENCOUNTER — Encounter (HOSPITAL_BASED_OUTPATIENT_CLINIC_OR_DEPARTMENT_OTHER): Payer: Self-pay | Admitting: Pulmonary Disease

## 2023-04-15 ENCOUNTER — Ambulatory Visit (HOSPITAL_BASED_OUTPATIENT_CLINIC_OR_DEPARTMENT_OTHER): Payer: Medicare Other | Admitting: Pulmonary Disease

## 2023-04-15 VITALS — BP 134/80 | HR 80 | Ht 68.0 in | Wt 226.4 lb

## 2023-04-15 DIAGNOSIS — G4734 Idiopathic sleep related nonobstructive alveolar hypoventilation: Secondary | ICD-10-CM

## 2023-04-15 DIAGNOSIS — J439 Emphysema, unspecified: Secondary | ICD-10-CM

## 2023-04-15 DIAGNOSIS — J4489 Other specified chronic obstructive pulmonary disease: Secondary | ICD-10-CM

## 2023-04-15 NOTE — Patient Instructions (Addendum)
 Correct dose for Shelby [name] is 2 puffs twice daily  Keep trying to quit smoking
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
## 2023-04-17 ENCOUNTER — Other Ambulatory Visit: Payer: Self-pay | Admitting: Family Medicine

## 2023-04-17 ENCOUNTER — Encounter: Payer: Self-pay | Admitting: Family Medicine

## 2023-04-17 MED ORDER — WEGOVY 0.5 MG/0.5ML ~~LOC~~ SOAJ: 0.5000 mg | SUBCUTANEOUS | 1 refills | Status: DC

## 2023-04-18 ENCOUNTER — Ambulatory Visit (HOSPITAL_BASED_OUTPATIENT_CLINIC_OR_DEPARTMENT_OTHER): Payer: Medicare Other | Admitting: Pulmonary Disease

## 2023-04-18 ENCOUNTER — Other Ambulatory Visit (HOSPITAL_COMMUNITY): Payer: Self-pay

## 2023-04-18 MED ORDER — WEGOVY 0.5 MG/0.5ML ~~LOC~~ SOAJ
0.5000 mg | SUBCUTANEOUS | 1 refills | Status: DC
  Filled 2023-04-18 – 2023-04-21 (×2): qty 2, 28d supply, fill #0
  Filled 2023-04-25: qty 2, fill #0

## 2023-04-18 NOTE — Telephone Encounter (Signed)
 Called and cancelled rx at [facility].   Resent to [facility] per patient request.

## 2023-04-21 ENCOUNTER — Other Ambulatory Visit (HOSPITAL_COMMUNITY): Payer: Self-pay

## 2023-04-21 MED ORDER — WEGOVY 1 MG/0.5ML ~~LOC~~ SOAJ
1.0000 mg | SUBCUTANEOUS | 1 refills | Status: DC
  Filled 2023-04-21 – 2023-04-25 (×2): qty 2, 28d supply, fill #0
  Filled 2023-05-18: qty 2, 28d supply, fill #1

## 2023-04-21 NOTE — Addendum Note (Signed)
 Addended byBarb Merino, January Bergthold on: 04/21/2023 04:37 PM   Modules accepted: Orders

## 2023-04-22 ENCOUNTER — Other Ambulatory Visit (HOSPITAL_COMMUNITY): Payer: Medicare Other

## 2023-04-23 DIAGNOSIS — J029 Acute pharyngitis, unspecified: Secondary | ICD-10-CM | POA: Diagnosis not present

## 2023-04-24 ENCOUNTER — Ambulatory Visit (HOSPITAL_COMMUNITY)

## 2023-04-24 ENCOUNTER — Ambulatory Visit (HOSPITAL_COMMUNITY): Payer: Medicare Other

## 2023-04-25 ENCOUNTER — Other Ambulatory Visit (HOSPITAL_COMMUNITY): Payer: Self-pay

## 2023-05-03 ENCOUNTER — Ambulatory Visit (HOSPITAL_COMMUNITY)
Admission: RE | Admit: 2023-05-03 | Discharge: 2023-05-03 | Disposition: A | Discharge status: Home / Self Care | Source: Ambulatory Visit | Attending: Obstetrics & Gynecology | Admitting: Obstetrics & Gynecology

## 2023-05-03 DIAGNOSIS — R102 Pelvic and perineal pain: Secondary | ICD-10-CM | POA: Insufficient documentation

## 2023-05-03 DIAGNOSIS — R1030 Lower abdominal pain, unspecified: Secondary | ICD-10-CM | POA: Diagnosis not present

## 2023-05-03 DIAGNOSIS — N809 Endometriosis, unspecified: Secondary | ICD-10-CM | POA: Diagnosis not present

## 2023-05-03 MED ORDER — IOHEXOL 300 MG/ML  SOLN
100.0000 mL | Freq: Once | INTRAMUSCULAR | Status: AC | PRN
  Administered 2023-05-03: 100 mL via INTRAVENOUS

## 2023-05-08 ENCOUNTER — Ambulatory Visit: Payer: Medicare Other | Admitting: Family Medicine

## 2023-05-08 ENCOUNTER — Encounter: Payer: Self-pay | Admitting: Family Medicine

## 2023-05-08 NOTE — Progress Notes (Signed)
     SUBJECTIVE:   CHIEF COMPLAINT / HPI:   Obesity On wegovy 1mg  weekly Has lost significant weight No concerns  Not interested in HIV screening or pneumonia vaccine today Hx TAH, last pap normal   PERTINENT  PMH / PSH: Obesity  OBJECTIVE:   BP 100/70   Pulse 74   Ht 5\' 8"  (1.727 m)   Wt 220 lb (99.8 kg)   LMP 05/12/2016   SpO2 99%   BMI 33.45 kg/m   General: NAD, pleasant, able to participate in exam Respiratory: No respiratory distress Skin: warm and dry, no rashes noted Psych: Normal affect and mood  ASSESSMENT/PLAN:   Assessment & Plan Morbid obesity (HCC) Stable on wegovy, having good response on 1mg . Continue current regimen   Vonna Drafts, MD [facility]

## 2023-05-08 NOTE — Assessment & Plan Note (Signed)
 Stable on wegovy, having good response on 1mg . Continue current regimen

## 2023-05-08 NOTE — Patient Instructions (Signed)
 Continue current medications.

## 2023-05-19 ENCOUNTER — Telehealth: Payer: Self-pay

## 2023-05-19 NOTE — Telephone Encounter (Signed)
 Pt called stating that she is still having issues with swallowing anything other than liquid. Pt was last seen in Feb and had an egd with dilation. Please advise.

## 2023-05-19 NOTE — Telephone Encounter (Signed)
Pt was made aware and verbalized understanding. Appt was made.

## 2023-05-19 NOTE — Telephone Encounter (Signed)
 I would start with ov, she was supposed to have one six weeks after her EGD.  If she is not able to swallow saliva, then go to the ED, that would be more of a sign of food impaction. Otherwise continue very soft/liquid foods until ov. May nee barium esophagram as next step.

## 2023-05-22 ENCOUNTER — Other Ambulatory Visit (HOSPITAL_COMMUNITY): Payer: Self-pay

## 2023-05-22 ENCOUNTER — Other Ambulatory Visit (HOSPITAL_COMMUNITY): Payer: Medicare Other

## 2023-05-23 ENCOUNTER — Encounter: Payer: Self-pay | Admitting: Family Medicine
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
## 2023-05-26 NOTE — Progress Notes (Unsigned)
 Patient left without being seen.

## 2023-05-27 ENCOUNTER — Encounter: Payer: Self-pay | Admitting: Gastroenterology

## 2023-05-27 ENCOUNTER — Ambulatory Visit: Admitting: Family Medicine

## 2023-05-27 ENCOUNTER — Encounter: Payer: Self-pay | Admitting: Family Medicine

## 2023-05-27 ENCOUNTER — Ambulatory Visit: Admitting: Gastroenterology

## 2023-05-27 VITALS — BP 128/76 | HR 72 | Ht 68.0 in | Wt 215.0 lb

## 2023-05-27 VITALS — BP 104/69 | HR 78 | Temp 97.7°F | Ht 68.0 in | Wt 217.0 lb

## 2023-05-27 DIAGNOSIS — Z0000 Encounter for general adult medical examination without abnormal findings: Secondary | ICD-10-CM | POA: Diagnosis not present

## 2023-05-27 DIAGNOSIS — E782 Mixed hyperlipidemia: Secondary | ICD-10-CM | POA: Diagnosis not present

## 2023-05-27 DIAGNOSIS — R1310 Dysphagia, unspecified: Secondary | ICD-10-CM

## 2023-05-27 NOTE — Patient Instructions (Signed)
 It was great to see you today! Thank you for choosing [facility] for your primary care. Shelby [name] was seen for annual physical exam.  Today we addressed: You are doing great! We will check your titer for MMR and your cholesterol level today. Otherwise your BP looks great and your A1c was 5.6, 2 months ago!  We are checking some labs today. I will send you a MyChart message with your results, per your preference. If you do not hear about your labs in the next 2 weeks, please call the office.  You should return to our clinic No follow-ups on file. Please arrive 15 minutes before your appointment to ensure smooth check in process.  We appreciate your efforts in making this happen.  Thank you for allowing me to participate in your care, [name], DO 05/27/2023, 4:20 PM PGY-1, [facility]

## 2023-05-27 NOTE — Assessment & Plan Note (Signed)
 LDL elevated to 113 at her last visit in February 2024.  Not on a statin. - Recheck lipid panel today

## 2023-05-28 ENCOUNTER — Encounter: Payer: Self-pay | Admitting: Family Medicine

## 2023-05-28 LAB — MEASLES/MUMPS/RUBELLA IMMUNITY
MUMPS ABS, IGG: 293 [AU]/ml (ref >10.9)
RUBEOLA AB, IGG: >300 [AU]/ml (ref >16.4)
Rubella Antibodies, IGG: 8.25 {index} (ref >0.99)

## 2023-05-28 LAB — LIPID PANEL
Chol/HDL Ratio: 4.8 ratio — ABNORMAL HIGH (ref 0.0–4.4)
Cholesterol, Total: 173 mg/dL (ref 100–199)
HDL: 36 mg/dL — ABNORMAL LOW (ref >39)
LDL Chol Calc (NIH): 115 mg/dL — ABNORMAL HIGH (ref 0–99)
Triglycerides: 121 mg/dL (ref 0–149)
VLDL Cholesterol Cal: 22 mg/dL (ref 5–40)

## 2023-05-29 ENCOUNTER — Other Ambulatory Visit: Payer: Self-pay | Admitting: Family Medicine

## 2023-05-29 ENCOUNTER — Encounter: Payer: Self-pay | Admitting: Family Medicine

## 2023-05-29 DIAGNOSIS — Z111 Encounter for screening for respiratory tuberculosis: Secondary | ICD-10-CM

## 2023-05-29 DIAGNOSIS — Z0184 Encounter for antibody response examination: Secondary | ICD-10-CM

## 2023-05-29 NOTE — Progress Notes (Signed)
 Patient requires additional immunity titers for school along with TB screening. Additional orders added.   [name], DO 05/29/23 3:49 PM

## 2023-05-29 NOTE — Addendum Note (Signed)
 Addended by: Ronnie Doo on: 05/29/2023 04:41 PM   Modules accepted: Orders

## 2023-06-02 ENCOUNTER — Other Ambulatory Visit

## 2023-06-02 DIAGNOSIS — Z111 Encounter for screening for respiratory tuberculosis: Secondary | ICD-10-CM | POA: Diagnosis not present

## 2023-06-02 DIAGNOSIS — Z0184 Encounter for antibody response examination: Secondary | ICD-10-CM | POA: Diagnosis not present

## 2023-06-03 ENCOUNTER — Encounter: Payer: Self-pay | Admitting: Family Medicine

## 2023-06-05 LAB — QUANTIFERON-TB GOLD PLUS
QuantiFERON Mitogen Value: 9.32 [IU]/mL
QuantiFERON Nil Value: 0.04 [IU]/mL
QuantiFERON TB1 Ag Value: 0.03 [IU]/mL
QuantiFERON TB2 Ag Value: 0.04 [IU]/mL

## 2023-06-05 LAB — HEPATITIS B CORE ANTIBODY, TOTAL: Hep B Core Total Ab: NEGATIVE

## 2023-06-05 LAB — HEPATITIS B SURFACE ANTIGEN: Hepatitis B Surface Ag: NEGATIVE

## 2023-06-05 LAB — VARICELLA ZOSTER ANTIBODY, IGG: Varicella zoster IgG: REACTIVE

## 2023-06-05 LAB — HEPATITIS B SURFACE ANTIBODY, QUANTITATIVE: Hepatitis B Surf Ab Quant: <3.5 m[IU]/mL — ABNORMAL LOW

## 2023-06-09 ENCOUNTER — Ambulatory Visit

## 2023-06-09 ENCOUNTER — Ambulatory Visit: Payer: Medicare Other

## 2023-06-09 VITALS — Ht 67.0 in | Wt 213.0 lb

## 2023-06-09 DIAGNOSIS — Z0000 Encounter for general adult medical examination without abnormal findings: Secondary | ICD-10-CM
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
## 2023-06-09 NOTE — Patient Instructions (Signed)
 Shelby [name] , Thank you for taking time to come for your Medicare Wellness Visit. I appreciate your ongoing commitment to your health goals. Please review the following plan we discussed and let me know if I can assist you in the future.   Referrals/Orders/Follow-Ups/Clinician Recommendations: Yes, keep maintaining your health by keeping your appointments with Dr. Edison Gore and any specialists that you may see.  Call us  if you need anything.  Have a great year!!!!  This is a list of the screening recommended for you and due dates:  Health Maintenance  Topic Date Due   COVID-19 Vaccine (3 - Moderna risk series) 01/23/2020   Pap with HPV screening  05/07/2024*   Pneumococcal Vaccination (1 of 2 - PCV) 05/07/2024*   HIV Screening  05/07/2024*   Flu Shot  09/12/2023   Mammogram  02/19/2024   Medicare Annual Wellness Visit  06/08/2024   DTaP/Tdap/Td vaccine (2 - Td or Tdap) 06/21/2026   Colon Cancer Screening  09/23/2027   Hepatitis C Screening  Completed   HPV Vaccine  Aged Out   Meningitis B Vaccine  Aged Out  *Topic was postponed. The date shown is not the original due date.    Advanced directives: (Declined) Advance directive discussed with you today. Even though you declined this today, please call our office should you change your mind, and we can give you the proper paperwork for you to fill out.  Next Medicare Annual Wellness Visit scheduled for next year: Yes

## 2023-06-10 ENCOUNTER — Ambulatory Visit

## 2023-06-10 ENCOUNTER — Telehealth: Payer: Self-pay

## 2023-06-10 ENCOUNTER — Other Ambulatory Visit: Payer: Self-pay | Admitting: Family Medicine

## 2023-06-10 DIAGNOSIS — Z23 Encounter for immunization: Secondary | ICD-10-CM | POA: Diagnosis not present

## 2023-06-10 MED ORDER — SEMAGLUTIDE-WEIGHT MANAGEMENT 1.7 MG/0.75ML ~~LOC~~ SOAJ
1.7000 mg | SUBCUTANEOUS | 2 refills | Status: AC
Start: 2023-09-05 — End: ?
  Filled 2023-06-10 – 2023-06-30 (×2): qty 3, 28d supply, fill #0
  Filled 2023-07-23: qty 3, 28d supply, fill #1

## 2023-06-10 NOTE — Telephone Encounter (Signed)
 Patient presents to nurse clinic for Hep B vaccination.   While in appointment, patient is requesting new prescription for increased dosage of Wegovy . She has been on current 1 mg dosage for approx 1 month and is tolerating well with no adverse effects.   She requests that rx be sent to [facility] pharmacy.   Elsie Halo, RN

## 2023-06-10 NOTE — Progress Notes (Signed)
 Patient presents to nurse clinic for Hep B vaccination.   Administered in RD, site unremarkable, tolerated injection well.   Provided with updated immunization record.   Patient due for 2nd Hep B vaccine on 07/08/23.  Scheduled patient for nurse visit for vaccination on 07/08/23.  Elsie Halo, RN

## 2023-06-11 ENCOUNTER — Other Ambulatory Visit (HOSPITAL_COMMUNITY): Payer: Self-pay

## 2023-06-23 ENCOUNTER — Other Ambulatory Visit (HOSPITAL_COMMUNITY): Payer: Self-pay

## 2023-06-30 ENCOUNTER — Other Ambulatory Visit (HOSPITAL_COMMUNITY): Payer: Self-pay

## 2023-07-06 ENCOUNTER — Encounter: Payer: Self-pay | Admitting: Family Medicine

## 2023-07-08 ENCOUNTER — Ambulatory Visit (INDEPENDENT_AMBULATORY_CARE_PROVIDER_SITE_OTHER)

## 2023-07-08 DIAGNOSIS — Z23 Encounter for immunization: Secondary | ICD-10-CM

## 2023-07-08 NOTE — Progress Notes (Signed)
 Patient presents to nurse clinic for second Hep B vaccination. Denies recent illness. Denies allergic reaction to first dose.   Administered in RD, site unremarkable, tolerated injection well. Provided patient with updated immunization record.   Elsie Halo, RN

## 2023-07-10 DIAGNOSIS — H6692 Otitis media, unspecified, left ear: Secondary | ICD-10-CM | POA: Diagnosis not present

## 2023-07-10 DIAGNOSIS — R030 Elevated blood-pressure reading, without diagnosis of hypertension: Secondary | ICD-10-CM | POA: Diagnosis not present

## 2023-07-10 DIAGNOSIS — E669 Obesity, unspecified: Secondary | ICD-10-CM | POA: Diagnosis not present

## 2023-07-10 DIAGNOSIS — J029 Acute pharyngitis, unspecified: Secondary | ICD-10-CM | POA: Diagnosis not present

## 2023-07-10 DIAGNOSIS — Z6831 Body mass index (BMI) 31.0-31.9, adult: Secondary | ICD-10-CM | POA: Diagnosis not present

## 2023-07-18 ENCOUNTER — Telehealth (HOSPITAL_BASED_OUTPATIENT_CLINIC_OR_DEPARTMENT_OTHER): Payer: Self-pay | Admitting: Pulmonary Disease

## 2023-07-18 MED ORDER — BREZTRI AEROSPHERE 160-9-4.8 MCG/ACT IN AERO: 2.0000 | INHALATION_SPRAY | Freq: Two times a day (BID) | RESPIRATORY_TRACT | Status: DC

## 2023-07-18 NOTE — Telephone Encounter (Signed)
 Copied from CRM [id number]. Topic: Clinical - Medication Refill >> Jul 18, 2023  8:11 AM [place] Lucky wrote: Medication: Budeson-Glycopyrrol-Formoterol  (BREZTRI  AEROSPHERE) 160-9-4.8 MCG/ACT AERO - Patient is ALSO requesting a sample to pick up.   Has the patient contacted their pharmacy? No - patient is out and does not like to refill it with her pharmacy, prefers samples  This is the patient's preferred pharmacy:  [id number] - [place], [place] - [address] [place] [address] Phone: [phone] Fax: [phone]  Is this the correct pharmacy for this prescription? Yes If no, delete pharmacy and type the correct one.   Has the prescription been filled recently? No  Is the patient out of the medication? Yes  Has the patient been seen for an appointment in the last year OR does the patient have an upcoming appointment? Yes  Can we respond through MyChart? Yes  Agent: Please be advised that Rx refills may take up to 3 business days. We ask that you follow-up with your pharmacy.

## 2023-07-18 NOTE — Telephone Encounter (Signed)
 Samples placed out front and pt notified on VM

## 2023-07-21 ENCOUNTER — Other Ambulatory Visit: Payer: Self-pay | Admitting: Family Medicine

## 2023-07-23 ENCOUNTER — Other Ambulatory Visit (HOSPITAL_COMMUNITY): Payer: Self-pay

## 2023-07-23 DIAGNOSIS — Z79899 Other long term (current) drug therapy: Secondary | ICD-10-CM | POA: Diagnosis not present

## 2023-07-23 DIAGNOSIS — M961 Postlaminectomy syndrome, not elsewhere classified: Secondary | ICD-10-CM | POA: Diagnosis not present

## 2023-07-23 DIAGNOSIS — M47816 Spondylosis without myelopathy or radiculopathy, lumbar region: Secondary | ICD-10-CM | POA: Diagnosis not present

## 2023-07-25 ENCOUNTER — Other Ambulatory Visit (HOSPITAL_COMMUNITY): Payer: Self-pay

## 2023-07-28 ENCOUNTER — Other Ambulatory Visit: Payer: Self-pay | Admitting: Family Medicine

## 2023-07-28 MED ORDER — SEMAGLUTIDE-WEIGHT MANAGEMENT 2.4 MG/0.75ML ~~LOC~~ SOAJ: 2.4000 mg | SUBCUTANEOUS | 3 refills | Status: DC

## 2023-07-29 DIAGNOSIS — M47816 Spondylosis without myelopathy or radiculopathy, lumbar region: Secondary | ICD-10-CM | POA: Diagnosis not present

## 2023-07-30 ENCOUNTER — Other Ambulatory Visit (HOSPITAL_COMMUNITY): Payer: Self-pay

## 2023-07-30 ENCOUNTER — Telehealth: Payer: Self-pay

## 2023-07-30 NOTE — Telephone Encounter (Signed)
 Pharmacy Patient Advocate Encounter   Received notification from CoverMyMeds that prior authorization for WEGOVY  2.4MG  is required/requested.   Insurance verification completed.   The patient is insured through [facility] .   PA required; PA submitted to above mentioned insurance via CoverMyMeds Key/confirmation #/EOC [id number]. Status is pending

## 2023-07-31 NOTE — Telephone Encounter (Signed)
 Pharmacy Patient Advocate Encounter  Received notification from [employer] that Prior Authorization for WEGOVY  has been DENIED.  Full denial letter will be uploaded to the media tab. See denial reason below.

## 2023-08-02 DIAGNOSIS — M5416 Radiculopathy, lumbar region: Secondary | ICD-10-CM | POA: Diagnosis not present

## 2023-08-06 ENCOUNTER — Other Ambulatory Visit: Payer: Self-pay

## 2023-08-06 ENCOUNTER — Encounter: Payer: Self-pay | Admitting: Emergency Medicine

## 2023-08-06 ENCOUNTER — Other Ambulatory Visit: Payer: Self-pay | Admitting: Family Medicine

## 2023-08-06 ENCOUNTER — Ambulatory Visit
Admission: EM | Admit: 2023-08-06 | Discharge: 2023-08-06 | Disposition: A | Discharge status: Home / Self Care | Attending: Nurse Practitioner | Admitting: Nurse Practitioner

## 2023-08-06 DIAGNOSIS — R112 Nausea with vomiting, unspecified: Secondary | ICD-10-CM | POA: Diagnosis not present

## 2023-08-06 DIAGNOSIS — G4700 Insomnia, unspecified: Secondary | ICD-10-CM

## 2023-08-06 DIAGNOSIS — F419 Anxiety disorder, unspecified: Secondary | ICD-10-CM

## 2023-08-06 DIAGNOSIS — G43809 Other migraine, not intractable, without status migrainosus: Secondary | ICD-10-CM

## 2023-08-06 MED ORDER — KETOROLAC TROMETHAMINE 30 MG/ML IJ SOLN
30.0000 mg | Freq: Once | INTRAMUSCULAR | Status: AC
  Administered 2023-08-06: 30 mg via INTRAMUSCULAR

## 2023-08-06 MED ORDER — ONDANSETRON 4 MG PO TBDP
4.0000 mg | ORAL_TABLET | Freq: Once | ORAL | Status: AC
  Administered 2023-08-06: 4 mg via ORAL

## 2023-08-06 MED ORDER — ONDANSETRON 4 MG PO TBDP: 4.0000 mg | ORAL_TABLET | Freq: Three times a day (TID) | ORAL | 0 refills | Status: AC | PRN

## 2023-08-06 NOTE — Discharge Instructions (Signed)
 As we discussed, it sounds like you have a stomach bug.  We gave you Zofran  today at 2:30 PM, you can continue to take this every 8 hours as needed for nausea/vomiting.  Push plenty of hydration at home.  If you develop severe abdominal pain, nausea/vomiting and are unable to keep fluids down despite the nausea medicine, please seek care in the emergency room.  We gave you an injection of Toradol  today to help with the migraine.  Seek care if symptoms do not improve with treatment.
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
## 2023-08-06 NOTE — ED Triage Notes (Addendum)
 Pt reports emesis since this am. Pt states has not been able to keep anything down since this am, including migraine med. Pt reports daughter has something similar.

## 2023-08-08 DIAGNOSIS — M961 Postlaminectomy syndrome, not elsewhere classified: Secondary | ICD-10-CM | POA: Diagnosis not present

## 2023-08-08 DIAGNOSIS — G8929 Other chronic pain: Secondary | ICD-10-CM | POA: Diagnosis not present

## 2023-08-08 DIAGNOSIS — M533 Sacrococcygeal disorders, not elsewhere classified: Secondary | ICD-10-CM | POA: Diagnosis not present

## 2023-08-08 DIAGNOSIS — Z79899 Other long term (current) drug therapy: Secondary | ICD-10-CM | POA: Diagnosis not present

## 2023-08-08 DIAGNOSIS — M5416 Radiculopathy, lumbar region: Secondary | ICD-10-CM | POA: Diagnosis not present

## 2023-08-08 DIAGNOSIS — Z5181 Encounter for therapeutic drug level monitoring: Secondary | ICD-10-CM | POA: Diagnosis not present

## 2023-08-08 DIAGNOSIS — R519 Headache, unspecified: Secondary | ICD-10-CM | POA: Diagnosis not present

## 2023-08-09 ENCOUNTER — Encounter: Payer: Self-pay | Admitting: Family Medicine

## 2023-08-11 NOTE — Telephone Encounter (Signed)
 Contacted the patient but she stated that she was currently at work. I will contact her back this afternoon to schedule her appt.

## 2023-08-11 NOTE — Telephone Encounter (Signed)
 Patient is scheduled for 07/08 @350 

## 2023-08-12 ENCOUNTER — Other Ambulatory Visit: Payer: Self-pay | Admitting: Obstetrics & Gynecology

## 2023-08-13 ENCOUNTER — Telehealth: Payer: Self-pay

## 2023-08-13 NOTE — Telephone Encounter (Signed)
 [facility] calls nurse line in regards to Wegovy  prescription.   She reports difficulty getting this to go through her insurance. Advised this medication was denied by her insurance. See other phone note.   It appears our pharmacy team advised her her insurance is requiring her to get this through mail order.   [facility] reports she was getting this filled at the [facility] and wonders if they will be able to get it to go through.   She reports she will discuss with patient and transfer to [place] if she is agreeable.  No further action from us  needed at this time.

## 2023-08-18 DIAGNOSIS — M533 Sacrococcygeal disorders, not elsewhere classified: Secondary | ICD-10-CM | POA: Diagnosis not present

## 2023-08-19 ENCOUNTER — Encounter: Payer: Self-pay | Admitting: Family Medicine

## 2023-08-19 ENCOUNTER — Ambulatory Visit: Admitting: Family Medicine

## 2023-08-19 VITALS — BP 120/87 | HR 71 | Ht 67.0 in | Wt 196.1 lb

## 2023-08-19 DIAGNOSIS — R413 Other amnesia: Secondary | ICD-10-CM

## 2023-08-19 DIAGNOSIS — G43009 Migraine without aura, not intractable, without status migrainosus: Secondary | ICD-10-CM | POA: Diagnosis not present

## 2023-08-19 DIAGNOSIS — S069X5S Unspecified intracranial injury with loss of consciousness greater than 24 hours with return to pre-existing conscious level, sequela: Secondary | ICD-10-CM

## 2023-08-19 MED ORDER — MAGNESIUM OXIDE -MG SUPPLEMENT 400 (240 MG) MG PO TABS: 400.0000 mg | ORAL_TABLET | Freq: Every day | ORAL | 5 refills | Status: AC | PRN
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
## 2023-08-19 NOTE — Patient Instructions (Addendum)
 Continue magnesium  as needed for headache/migraine  We will check and see if your device is compatible with MRI
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
## 2023-08-20 ENCOUNTER — Telehealth: Payer: Self-pay

## 2023-08-20 NOTE — Telephone Encounter (Signed)
-----   Message from [place] sent at 08/19/2023  6:24 PM EDT ----- Regarding: MRI compatible Hey team  I'm trying to see if this patient's device is MRI compatible  Sounds like she had a spinal stimulator placed but then it was removed. However she only had the battery pack removed and reports her wires/leads are still in her body.   I've put a picture of her device card in the media tab. Can you call [employer] and provide them with this info as well as the device details, etc on the card and ask if the wires/leads are MRI compatible if she's had the battery pack removed? And if [employer] isn't available could try calling MRI department.   This can be done in the next couple of days whenever you're able, not urgent  Thanks, Atif

## 2023-08-21 ENCOUNTER — Encounter: Payer: Self-pay | Admitting: Family Medicine

## 2023-08-22 ENCOUNTER — Other Ambulatory Visit (HOSPITAL_COMMUNITY): Payer: Self-pay

## 2023-08-22 MED ORDER — WEGOVY 2.4 MG/0.75ML ~~LOC~~ SOAJ
2.4000 mg | SUBCUTANEOUS | 2 refills | Status: DC
  Filled 2023-08-22: qty 3, 28d supply, fill #0
  Filled 2023-09-22: qty 3, 28d supply, fill #1
  Filled 2023-10-19: qty 3, 28d supply, fill #2

## 2023-08-29 ENCOUNTER — Encounter: Payer: Self-pay | Admitting: Family Medicine

## 2023-09-12 ENCOUNTER — Encounter: Payer: Self-pay | Admitting: Family Medicine

## 2023-09-15 ENCOUNTER — Encounter: Payer: Self-pay | Admitting: Family Medicine

## 2023-09-15 DIAGNOSIS — R21 Rash and other nonspecific skin eruption: Secondary | ICD-10-CM

## 2023-09-16 NOTE — Telephone Encounter (Signed)
 So before we can schedule them in skin clinic you will have to speak with a faculty doctor first and once they approve it shae will schedule the appt.

## 2023-09-18 NOTE — Telephone Encounter (Signed)
 Reviewed and agree.

## 2023-09-23 DIAGNOSIS — T148XXA Other injury of unspecified body region, initial encounter: Secondary | ICD-10-CM | POA: Diagnosis not present

## 2023-09-23 DIAGNOSIS — L281 Prurigo nodularis: Secondary | ICD-10-CM | POA: Diagnosis not present

## 2023-10-14 DIAGNOSIS — T148XXA Other injury of unspecified body region, initial encounter: Secondary | ICD-10-CM | POA: Diagnosis not present

## 2023-10-14 DIAGNOSIS — M1611 Unilateral primary osteoarthritis, right hip: Secondary | ICD-10-CM | POA: Diagnosis not present

## 2023-10-14 DIAGNOSIS — J449 Chronic obstructive pulmonary disease, unspecified: Secondary | ICD-10-CM | POA: Diagnosis not present

## 2023-10-14 DIAGNOSIS — J45909 Unspecified asthma, uncomplicated: Secondary | ICD-10-CM | POA: Diagnosis not present

## 2023-10-14 DIAGNOSIS — L281 Prurigo nodularis: Secondary | ICD-10-CM | POA: Diagnosis not present

## 2023-10-22 DIAGNOSIS — M545 Low back pain, unspecified: Secondary | ICD-10-CM | POA: Diagnosis not present

## 2023-10-23 DIAGNOSIS — H5213 Myopia, bilateral: Secondary | ICD-10-CM | POA: Diagnosis not present

## 2023-10-23 DIAGNOSIS — H52223 Regular astigmatism, bilateral: Secondary | ICD-10-CM | POA: Diagnosis not present

## 2023-10-23 DIAGNOSIS — H5501 Congenital nystagmus: Secondary | ICD-10-CM | POA: Diagnosis not present

## 2023-10-23 DIAGNOSIS — H524 Presbyopia: Secondary | ICD-10-CM | POA: Diagnosis not present

## 2023-10-24 ENCOUNTER — Other Ambulatory Visit (HOSPITAL_COMMUNITY): Payer: Self-pay

## 2023-10-28 DIAGNOSIS — Z9682 Presence of neurostimulator: Secondary | ICD-10-CM | POA: Diagnosis not present

## 2023-10-28 DIAGNOSIS — T85192A Other mechanical complication of implanted electronic neurostimulator (electrode) of spinal cord, initial encounter: Secondary | ICD-10-CM | POA: Diagnosis not present

## 2023-10-29 ENCOUNTER — Encounter (HOSPITAL_BASED_OUTPATIENT_CLINIC_OR_DEPARTMENT_OTHER): Payer: Self-pay | Admitting: Pulmonary Disease

## 2023-10-29 DIAGNOSIS — M1611 Unilateral primary osteoarthritis, right hip: Secondary | ICD-10-CM | POA: Diagnosis not present

## 2023-10-30 ENCOUNTER — Ambulatory Visit (HOSPITAL_BASED_OUTPATIENT_CLINIC_OR_DEPARTMENT_OTHER): Admitting: Pulmonary Disease

## 2023-10-30 ENCOUNTER — Encounter (HOSPITAL_BASED_OUTPATIENT_CLINIC_OR_DEPARTMENT_OTHER): Payer: Self-pay

## 2023-10-30 ENCOUNTER — Encounter (HOSPITAL_BASED_OUTPATIENT_CLINIC_OR_DEPARTMENT_OTHER): Payer: Self-pay | Admitting: Pulmonary Disease

## 2023-10-30 NOTE — Telephone Encounter (Signed)
 Can you R/S for us 

## 2023-11-05 DIAGNOSIS — T85192A Other mechanical complication of implanted electronic neurostimulator (electrode) of spinal cord, initial encounter: Secondary | ICD-10-CM | POA: Diagnosis not present

## 2023-11-05 DIAGNOSIS — T85199A Other mechanical complication of other implanted electronic stimulator of nervous system, initial encounter: Secondary | ICD-10-CM | POA: Diagnosis not present

## 2023-11-05 DIAGNOSIS — G894 Chronic pain syndrome: Secondary | ICD-10-CM | POA: Diagnosis not present

## 2023-11-20 ENCOUNTER — Encounter: Payer: Self-pay | Admitting: Obstetrics & Gynecology

## 2023-11-24 ENCOUNTER — Other Ambulatory Visit: Payer: Self-pay | Admitting: Family Medicine

## 2023-11-24 ENCOUNTER — Other Ambulatory Visit (HOSPITAL_COMMUNITY): Payer: Self-pay

## 2023-11-24 DIAGNOSIS — Z5181 Encounter for therapeutic drug level monitoring: Secondary | ICD-10-CM | POA: Diagnosis not present

## 2023-11-24 DIAGNOSIS — M5416 Radiculopathy, lumbar region: Secondary | ICD-10-CM | POA: Diagnosis not present

## 2023-11-24 MED ORDER — WEGOVY 2.4 MG/0.75ML ~~LOC~~ SOAJ
2.4000 mg | SUBCUTANEOUS | 2 refills | Status: AC
  Filled 2023-11-24 – 2023-11-28 (×2): qty 3, 28d supply, fill #0

## 2023-11-25 ENCOUNTER — Telehealth: Payer: Self-pay

## 2023-11-25 ENCOUNTER — Ambulatory Visit (INDEPENDENT_AMBULATORY_CARE_PROVIDER_SITE_OTHER): Admitting: Family Medicine

## 2023-11-25 DIAGNOSIS — E663 Overweight: Secondary | ICD-10-CM

## 2023-11-25 DIAGNOSIS — R21 Rash and other nonspecific skin eruption: Secondary | ICD-10-CM

## 2023-11-25 MED ORDER — ORLISTAT 120 MG PO CAPS: 120.0000 mg | ORAL_CAPSULE | Freq: Three times a day (TID) | ORAL | 0 refills | Status: DC

## 2023-11-25 NOTE — Progress Notes (Signed)
    SUBJECTIVE:   CHIEF COMPLAINT / HPI:   Here to discuss wegovy   Discussed the use of AI scribe software for clinical note transcription with the patient, who gave verbal consent to proceed.  History of Present Illness Shelby [name] is a 48 year old female who presents with concerns about weight management and skin lesions.  Weight management and pharmacotherapy - Lost nearly 100 pounds while taking Wegovy , which is no longer covered by insurance due to recent changes. - Concerned about regaining weight without Wegovy . - Current regimen includes walking 3 to 5 miles in the mornings with her sister; recently paused due to back problems. Follows w ortho for this - Most recent hemoglobin A1c is 5.6. - Interested in alternative weight loss medications that may be covered by insurance. - Increased physical activity since starting Wegovy , but concerned that exercise alone may not be sufficient for weight maintenance.  Cutaneous lesions - Lesions initially diagnosed as bruising under the skin by dermatologist. - Lesions have not improved with antibiotics and have spread to her face, causing embarrassment. - Lesions are pruritic and sometimes bleed, especially when scratched. Some have blistered up and drained blood - Prescription hydrocortisone has not provided significant improvement. - Lesions have been present for several months, beginning shortly after starting Wegovy . - Some lesions have increased in size and new lesions have appeared. - No new exposures to detergents or soaps. - History of allergies to some detergents, which she avoids. - interested in biopsy     PERTINENT  PMH / PSH: htn, mild osa, copd, migraines  OBJECTIVE:   BP 131/78   Pulse 75   Ht 5' 7 (1.702 m)   Wt 180 lb 6.4 oz (81.8 kg)   LMP 05/12/2016   SpO2 99%   BMI 28.25 kg/m    Physical Exam General: NAD, pleasant, able to participate in exam Respiratory: No respiratory distress Skin: see  photo. Erythematous vascular appearing rash scattered on lower arm and hand. Lesion on forearm is somewhat raised but not fluctuant. Nontender throughout. Some excoriation present on right arm though rash is most pronounced on left arm. I did not appreciate a similar rash on her face today Psych: Normal affect and mood    ASSESSMENT/PLAN:    Assessment & Plan Morbid obesity (HCC) Overweight (BMI 25.0-29.9) BMI >27 with hx hypertension Significant weight loss achieved with Wegovy , but insurance no longer covers it. Discussed orlistat as an alternative, with favorable insurance coverage anticipated. She is committed to continue her lifestyle changes including healthier eating and regular exercise but would benefit from continued medical management to avoid regaining weight lost while on Wegovy  - Prescribe orlistat 120 mg three times daily with meals. Do not take if skipping a meal. - [facility]er to [facility] for further evaluation and management. Rash and nonspecific skin eruption Chronic pruritic and bruising skin lesions unresponsive to topical steroids. Interestingly appears somewhat vascular in nature. Patient interested in biopsy to confirm diagnosis which I feel is appropriate - Schedule for derm clinic, consider biopsy. Message sent for scheduling - Instruct to avoid topical steroids prior to biopsy   Payton Coward, MD [facility]

## 2023-11-25 NOTE — Assessment & Plan Note (Addendum)
 BMI >27 with hx hypertension Significant weight loss achieved with Wegovy , but insurance no longer covers it. Discussed orlistat as an alternative, with favorable insurance coverage anticipated. She is committed to continue her lifestyle changes including healthier eating and regular exercise but would benefit from continued medical management to avoid regaining weight lost while on Wegovy  - Prescribe orlistat 120 mg three times daily with meals. Do not take if skipping a meal. - Refer to [facility] for further evaluation and management.

## 2023-11-25 NOTE — Telephone Encounter (Signed)
 Patient calls nurse line in regards to Orlistat.  She reports the medication is ~2400 dollars.   Advised will forward to pharmacy team for assistance.

## 2023-11-25 NOTE — Patient Instructions (Addendum)
  VISIT SUMMARY: During your visit, we discussed your concerns about weight management and skin lesions.   YOUR PLAN: CHRONIC PRURITIC AND BRUISING SKIN LESIONS: You have skin lesions that are itchy, sometimes bleed, and have not improved with current treatments. We need a biopsy to determine the exact cause. -You should get a call soon to schedule an appointment in our dermatology clinic -Avoid using topical steroids for 1-2 weeks before the biopsy  OVERWEIGHT: You have successfully lost weight with Wegovy , but it is no longer covered by your insurance. We discussed an alternative medication. -Prescribe orlistat 120 mg three times daily with meals. Do not take if skipping a meal. -Referral placed to the [facility] for further evaluation and management.                      Contains text generated by Abridge.                                 Contains text generated by Abridge.

## 2023-11-26 ENCOUNTER — Other Ambulatory Visit (HOSPITAL_COMMUNITY): Payer: Self-pay

## 2023-11-26 NOTE — Telephone Encounter (Signed)
 Per test claim:   Patient has 2 insurances; commercial and part D.   Commercial requires a PA.  Preferred alternatives:  PHENTERMINE  CAP 37.5MG         DIETHYLPROP  TAB 25MG           PHENT/TOPIRA CAP 15-92MG 

## 2023-11-28 ENCOUNTER — Encounter: Payer: Self-pay | Admitting: Family Medicine

## 2023-11-28 ENCOUNTER — Other Ambulatory Visit: Payer: Self-pay | Admitting: Pulmonary Disease

## 2023-11-28 ENCOUNTER — Other Ambulatory Visit (HOSPITAL_COMMUNITY): Payer: Self-pay

## 2023-11-29 ENCOUNTER — Other Ambulatory Visit (HOSPITAL_COMMUNITY): Payer: Self-pay

## 2023-12-02 ENCOUNTER — Encounter: Payer: Self-pay | Admitting: Family Medicine

## 2023-12-02 ENCOUNTER — Other Ambulatory Visit: Payer: Self-pay

## 2023-12-02 DIAGNOSIS — H43393 Other vitreous opacities, bilateral: Secondary | ICD-10-CM | POA: Diagnosis not present

## 2023-12-02 DIAGNOSIS — G43009 Migraine without aura, not intractable, without status migrainosus: Secondary | ICD-10-CM

## 2023-12-05 ENCOUNTER — Telehealth: Payer: Self-pay

## 2023-12-05 ENCOUNTER — Other Ambulatory Visit (HOSPITAL_COMMUNITY): Payer: Self-pay

## 2023-12-05 NOTE — Telephone Encounter (Signed)
 Prior authorization submitted for Phentermine-Topiramate ER 3.75-23MG  er capsules to [facility] via Latent.   Key: [id number]

## 2023-12-05 NOTE — Telephone Encounter (Signed)
 Pharmacy Patient Advocate Encounter  Received notification from [facility] that Prior Authorization for Phentermine-Topiramate ER 3.75-23MG  er capsules has been DENIED.  Full denial letter will be uploaded to the media tab. See denial reason below.  PA #/Case ID/Reference #: [id number]  PA rec'd from pharmacy was from secondary insurance (Medicare). Will submit to patients primary insurance.

## 2023-12-06 ENCOUNTER — Ambulatory Visit (HOSPITAL_COMMUNITY)
Admission: RE | Admit: 2023-12-06 | Discharge: 2023-12-06 | Disposition: A | Discharge status: Home / Self Care | Source: Ambulatory Visit | Attending: Family Medicine | Admitting: Family Medicine

## 2023-12-06 DIAGNOSIS — G43009 Migraine without aura, not intractable, without status migrainosus: Secondary | ICD-10-CM | POA: Insufficient documentation

## 2023-12-06 DIAGNOSIS — R519 Headache, unspecified: Secondary | ICD-10-CM | POA: Diagnosis not present

## 2023-12-06 DIAGNOSIS — G9389 Other specified disorders of brain: Secondary | ICD-10-CM | POA: Diagnosis not present

## 2023-12-06 MED ORDER — GADOBUTROL 1 MMOL/ML IV SOLN
7.0000 mL | Freq: Once | INTRAVENOUS | Status: AC | PRN
  Administered 2023-12-06: 7 mL via INTRAVENOUS

## 2023-12-07 ENCOUNTER — Encounter (HOSPITAL_BASED_OUTPATIENT_CLINIC_OR_DEPARTMENT_OTHER): Payer: Self-pay | Admitting: Pulmonary Disease

## 2023-12-08 ENCOUNTER — Ambulatory Visit (HOSPITAL_COMMUNITY): Admission: RE | Admit: 2023-12-08

## 2023-12-08 ENCOUNTER — Encounter (HOSPITAL_COMMUNITY): Payer: Self-pay

## 2023-12-09 ENCOUNTER — Other Ambulatory Visit (HOSPITAL_COMMUNITY): Payer: Self-pay

## 2023-12-09 NOTE — Telephone Encounter (Signed)
 Primary insurance has last name as [employer].  Prior authorization submitted for Phentermine-Topiramate ER 3.75-23MG  er capsules  to [employer] via Latent.   Key: [id number]

## 2023-12-09 NOTE — Telephone Encounter (Signed)
 Issues submitting to primary insurance.   Calling insurance.

## 2023-12-10 ENCOUNTER — Other Ambulatory Visit (HOSPITAL_COMMUNITY): Payer: Self-pay

## 2023-12-10 NOTE — Telephone Encounter (Signed)
 Pharmacy Patient Advocate Encounter  Received notification from [facility] that Prior Authorization for Phentermine-Topiramate ER 3.75-23MG  er capsules has been APPROVED from 12/09/23 to 06/08/24  Per test claim: Prescriber is not authorized for drug's DEA Class.       An attending may need to resend the RX.                             Insurance will fill a 30 day supply at a time.   PA #/Case ID/Reference #: [id number]

## 2023-12-11 ENCOUNTER — Ambulatory Visit: Payer: Self-pay | Admitting: Family Medicine

## 2023-12-11 DIAGNOSIS — Z23 Encounter for immunization: Secondary | ICD-10-CM | POA: Diagnosis not present

## 2023-12-11 DIAGNOSIS — G43009 Migraine without aura, not intractable, without status migrainosus: Secondary | ICD-10-CM

## 2023-12-12 ENCOUNTER — Ambulatory Visit (HOSPITAL_BASED_OUTPATIENT_CLINIC_OR_DEPARTMENT_OTHER): Admitting: Pulmonary Disease

## 2023-12-18 ENCOUNTER — Ambulatory Visit (INDEPENDENT_AMBULATORY_CARE_PROVIDER_SITE_OTHER): Admitting: Family Medicine

## 2023-12-18 VITALS — BP 127/95 | HR 72 | Temp 98.6°F | Ht 68.0 in | Wt 182.6 lb

## 2023-12-18 DIAGNOSIS — R21 Rash and other nonspecific skin eruption: Secondary | ICD-10-CM

## 2023-12-18 MED ORDER — TRIAMCINOLONE ACETONIDE 0.5 % EX OINT
1.0000 | TOPICAL_OINTMENT | Freq: Two times a day (BID) | CUTANEOUS | 2 refills | Status: AC
Start: 2023-12-18 — End: 2024-01-17

## 2023-12-18 MED ORDER — TRIAMCINOLONE ACETONIDE 0.5 % EX OINT
1.0000 | TOPICAL_OINTMENT | Freq: Two times a day (BID) | CUTANEOUS | 0 refills | Status: DC
Start: 2023-12-18 — End: 2023-12-18

## 2023-12-18 NOTE — Patient Instructions (Signed)
 Good to see you today - Thank you for coming in  Things we discussed today:  Please apply the steroid cream twice a day for 1 month.  Avoid itching the spots. You can apply vaseline to help with itching. Follow up in 1 month to see if they are improving.

## 2023-12-18 NOTE — Progress Notes (Signed)
    SUBJECTIVE:   CHIEF COMPLAINT / HPI:   Skin lesion  Patient reports starting about four months ago started getting lesions on her arms that are darker. She has gotten sores on her arms for a long time. She said she was told by a dermatologist it was not caused by scratching but was probably bruises. Says these new lesions are not going away despite steroid cream. Lesions are sometimes pruritic. Says that they form blisters and get tight and then burst. Then the spots get darker.  Wegovy  was the only newer medicine.  No constitutional symptoms. No bleeding disorders.  Has not changed any soaps or lotions.   Patient's mom had a large lesion on her leg and got it biopsied and was cancerous. It was fully removed but this causes patient's concern.   Skin cancer and colon cancer run in the family.   PERTINENT  PMH / PSH: COPD, TBI, Tobacco use,   OBJECTIVE:   BP (!) 127/95   Pulse 72   Temp 98.6 F (37 C)   Ht 5' 8 (1.727 m)   Wt 182 lb 9.6 oz (82.8 kg)   LMP 05/12/2016   BMI 27.76 kg/m   General: well appearing, in no acute distress Resp: Normal work of breathing on room air Neuro: Alert & Oriented Lymph: No cervical, supraclavicular, axillary lymphadenopathy Derm: Bilateral arms with circular erythematous macules, few ruptured vesicles bilaterally, left arm with erythematous/purpuric lesion of about 1.5 cm, evidence of excoriations bilaterally.  Lesions are on extensor surfaces of bilateral forearms. Picture in media tab   ASSESSMENT/PLAN:   Assessment & Plan Rash and nonspecific skin eruption Most likely patient has a nonspecific inflammatory skin condition causing some vesicular lesions.  Unlikely HSV.  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 - With shared decision making have decided to try a stronger steroid cream, triamcinolone , and consider biopsy if this does not improved by next visit. - Follow-up in January     Areta Saliva,  MD [facility]

## 2023-12-23 ENCOUNTER — Encounter: Payer: Self-pay | Admitting: Family Medicine

## 2023-12-31 ENCOUNTER — Other Ambulatory Visit: Payer: Self-pay | Admitting: Family Medicine

## 2023-12-31 DIAGNOSIS — G4700 Insomnia, unspecified: Secondary | ICD-10-CM

## 2023-12-31 DIAGNOSIS — F419 Anxiety disorder, unspecified: Secondary | ICD-10-CM

## 2024-01-05 DIAGNOSIS — M180 Bilateral primary osteoarthritis of first carpometacarpal joints: Secondary | ICD-10-CM | POA: Diagnosis not present

## 2024-01-12 ENCOUNTER — Other Ambulatory Visit (HOSPITAL_BASED_OUTPATIENT_CLINIC_OR_DEPARTMENT_OTHER): Payer: Self-pay

## 2024-01-12 MED ORDER — BREZTRI AEROSPHERE 160-9-4.8 MCG/ACT IN AERO: 2.0000 | INHALATION_SPRAY | Freq: Two times a day (BID) | RESPIRATORY_TRACT | 1 refills | Status: AC

## 2024-01-20 NOTE — Progress Notes (Unsigned)
    SUBJECTIVE:   CHIEF COMPLAINT / HPI:   Skin lesion Pt seen in our derm clinic 11/6 for rash, advised to use triamcinolone  and consider biopsy if no improvement  Pt reports she tried the kenalog  but it made the rash worse   Discussed the use of AI scribe software for clinical note transcription with the patient, who gave verbal consent to proceed.  History of Present Illness      PERTINENT  PMH / PSH: ***  OBJECTIVE:   LMP 05/12/2016    Physical Exam   ***  ASSESSMENT/PLAN:   Assessment and Plan Assessment & Plan     ***  Assessment & Plan    Payton Coward, MD [facility]

## 2024-01-21 ENCOUNTER — Encounter: Payer: Self-pay | Admitting: Family Medicine

## 2024-01-21 ENCOUNTER — Ambulatory Visit (INDEPENDENT_AMBULATORY_CARE_PROVIDER_SITE_OTHER): Admitting: Family Medicine

## 2024-01-21 VITALS — BP 121/76 | HR 72 | Ht 68.0 in | Wt 185.4 lb

## 2024-01-21 DIAGNOSIS — F39 Unspecified mood [affective] disorder: Secondary | ICD-10-CM

## 2024-01-21 DIAGNOSIS — R21 Rash and other nonspecific skin eruption: Secondary | ICD-10-CM | POA: Diagnosis not present

## 2024-01-21 DIAGNOSIS — F419 Anxiety disorder, unspecified: Secondary | ICD-10-CM | POA: Diagnosis not present

## 2024-01-21 DIAGNOSIS — F339 Major depressive disorder, recurrent, unspecified: Secondary | ICD-10-CM | POA: Diagnosis not present

## 2024-01-21 MED ORDER — ARIPIPRAZOLE 20 MG PO TABS: ORAL_TABLET | ORAL | Status: DC

## 2024-01-21 MED ORDER — QUETIAPINE FUMARATE 50 MG PO TABS: ORAL_TABLET | ORAL | 0 refills | Status: DC

## 2024-01-21 NOTE — Patient Instructions (Addendum)
 Assuming you get you seroquel  (quetiapine ) today:  Day 1 (today): take 1/2 tablet of Abilify  (aripiprazole ) Day 2: Take 1/2 tablet abilify  Day 3: Take 1/2 tablet abilify  AND 50mg  seroquel  Day 4: ONLY 50mg  seroquel  Continue 50mg  Seroquel  for 1 week, then increase to 100mg   [facility]  Address: [address],   [place], [place] [address]  Phone: [phone]   [facility] [address] [place], [place] [address] [phone]  If any of your results from today are abnormal and/or require changes to your medical care, I will give you a call. Otherwise, I will send you a letter in the mail or a message on MyChart.   watch for any signs of infection (redness, pus, pain, increased swelling or fever) and call if such occurs.

## 2024-01-21 NOTE — Addendum Note (Signed)
 Addended by: ANDERS CUMINS T on: 01/21/2024 03:37 PM   Modules accepted: Level of Service

## 2024-01-21 NOTE — Assessment & Plan Note (Signed)
 Abilify  20mg  not working for patient anymore Cross taper and switch to seroquel  Provided instructions:  -Day 1 (today): take 1/2 tablet of Abilify  (aripiprazole ) -Day 2: Take 1/2 tablet abilify  -Day 3: Take 1/2 tablet abilify  AND 50mg  seroquel  -Day 4: ONLY 50mg  seroquel  -Continue 50mg  Seroquel  for 1 week, then increase to 100mg  at bedtime  F/u in 3 weeks Continue cymbalta 

## 2024-01-27 ENCOUNTER — Encounter: Payer: Self-pay | Admitting: Family Medicine

## 2024-01-27 LAB — DERMATOLOGY PATHOLOGY

## 2024-01-28 ENCOUNTER — Ambulatory Visit: Payer: Self-pay | Admitting: Family Medicine

## 2024-01-28 ENCOUNTER — Encounter: Payer: Self-pay | Admitting: Family Medicine

## 2024-01-28 DIAGNOSIS — R21 Rash and other nonspecific skin eruption: Secondary | ICD-10-CM

## 2024-01-28 DIAGNOSIS — Z1329 Encounter for screening for other suspected endocrine disorder: Secondary | ICD-10-CM

## 2024-02-02 ENCOUNTER — Other Ambulatory Visit

## 2024-02-02 LAB — HM MAMMOGRAPHY

## 2024-02-03 ENCOUNTER — Other Ambulatory Visit

## 2024-02-03 DIAGNOSIS — Z1329 Encounter for screening for other suspected endocrine disorder: Secondary | ICD-10-CM

## 2024-02-03 DIAGNOSIS — R21 Rash and other nonspecific skin eruption: Secondary | ICD-10-CM

## 2024-02-04 ENCOUNTER — Ambulatory Visit: Payer: Self-pay | Admitting: Family Medicine

## 2024-02-04 ENCOUNTER — Encounter: Payer: Self-pay | Admitting: Family Medicine

## 2024-02-04 LAB — RHEUMATOID FACTOR: Rheumatoid fact SerPl-aCnc: <10 [IU]/mL

## 2024-02-04 LAB — ANA: Anti Nuclear Antibody (ANA): NEGATIVE

## 2024-02-04 LAB — TSH: TSH: 1.66 u[IU]/mL (ref 0.450–4.500)

## 2024-02-16 ENCOUNTER — Encounter: Payer: Self-pay | Admitting: Family Medicine

## 2024-02-16 ENCOUNTER — Ambulatory Visit: Admitting: Family Medicine

## 2024-02-16 VITALS — BP 126/83 | HR 85 | Ht 69.0 in | Wt 185.8 lb

## 2024-02-16 DIAGNOSIS — Z23 Encounter for immunization: Secondary | ICD-10-CM | POA: Diagnosis present

## 2024-02-16 DIAGNOSIS — F39 Unspecified mood [affective] disorder: Secondary | ICD-10-CM | POA: Diagnosis not present

## 2024-02-16 DIAGNOSIS — F419 Anxiety disorder, unspecified: Secondary | ICD-10-CM | POA: Diagnosis not present

## 2024-02-16 DIAGNOSIS — R21 Rash and other nonspecific skin eruption: Secondary | ICD-10-CM

## 2024-02-16 DIAGNOSIS — F339 Major depressive disorder, recurrent, unspecified: Secondary | ICD-10-CM

## 2024-02-16 MED ORDER — QUETIAPINE FUMARATE 50 MG PO TABS: 100.0000 mg | ORAL_TABLET | Freq: Every day | ORAL | Status: AC

## 2024-02-16 NOTE — Assessment & Plan Note (Signed)
 Reassuringly seroquel  has been helping more than abilify  Continue current dose of 100mg  at bedtime Consider increasing to 150 at future visits if tolerating. Stable for now. No SI/HI F/u 4mo

## 2024-02-16 NOTE — Patient Instructions (Addendum)
 Continue seroquel  as prescribed  Call weight management clinic:  [address] [place], [place] [address] [phone]  [facility] at [facility] in: [facility] [name]. [facility] [address], [address]  [place], [place] [address] Phone: [phone]

## 2024-02-16 NOTE — Progress Notes (Signed)
" ° ° °  SUBJECTIVE:   CHIEF COMPLAINT / HPI: f/u on mood disorder, MDD, anxiety  At last visit 12/10 she was instructed to self taper off of abilify  and switch to seroquel  for better control of her mood - at that time was having some volatility in her mood. She's also on Cymbalta  60  Today reports some improvement w/ seroquel  She did fine with the taper Husband also endorses he has noticed some improvement in mood Also reports some improvement in sleep  Has not yet heard from vascular about appointment regarding her rash. Noticed some new spots on her face. Not bothering her much mostly cosmetic. She's not scratching at it.    PERTINENT  PMH / PSH: reviewed  OBJECTIVE:   BP 126/83   Pulse 85   Ht 5' 9 (1.753 m)   Wt 185 lb 12.8 oz (84.3 kg)   LMP 05/12/2016   SpO2 100%   BMI 27.44 kg/m    General: NAD, pleasant, able to participate in exam Respiratory: No respiratory distress Skin: warm and dry, no rashes noted Psych: Normal affect and mood  ASSESSMENT/PLAN:    Assessment & Plan Mood disorder Recurrent major depressive disorder, remission status unspecified Anxiety Reassuringly seroquel  has been helping more than abilify  Continue current dose of 100mg  at bedtime Consider increasing to 150 at future visits if tolerating. Stable for now. No SI/HI F/u 93mo Encounter for immunization Flu shot today Rash and nonspecific skin eruption Provided contact info for vascular specialist for f/u. 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

## 2024-02-25 ENCOUNTER — Other Ambulatory Visit: Payer: Self-pay

## 2024-02-25 DIAGNOSIS — I872 Venous insufficiency (chronic) (peripheral): Secondary | ICD-10-CM

## 2024-03-16 ENCOUNTER — Ambulatory Visit (HOSPITAL_COMMUNITY)

## 2024-03-17 ENCOUNTER — Encounter: Payer: Self-pay | Admitting: Family Medicine

## 2024-03-23 ENCOUNTER — Ambulatory Visit

## 2024-03-29 ENCOUNTER — Ambulatory Visit (HOSPITAL_COMMUNITY)

## 2024-06-10 ENCOUNTER — Encounter
